# Patient Record
Sex: Female | Born: 1985 | Race: Black or African American | Hispanic: No | Marital: Single
Health system: Midwestern US, Community
[De-identification: ages and names within clinical notes are randomized; demographics above are authoritative.]

## PROBLEM LIST (undated history)

## (undated) ENCOUNTER — Inpatient Hospital Stay: Payer: Self-pay

## (undated) ENCOUNTER — Inpatient Hospital Stay (HOSPITAL_COMMUNITY): Payer: Self-pay

## (undated) DIAGNOSIS — F419 Anxiety disorder, unspecified: Secondary | ICD-10-CM

## (undated) DIAGNOSIS — K50119 Crohn's disease of large intestine with unspecified complications: Secondary | ICD-10-CM

## (undated) DIAGNOSIS — Z01812 Encounter for preprocedural laboratory examination: Secondary | ICD-10-CM

## (undated) DIAGNOSIS — K50913 Crohn's disease, unspecified, with fistula: Secondary | ICD-10-CM

## (undated) DIAGNOSIS — K50012 Crohn's disease of small intestine with intestinal obstruction: Secondary | ICD-10-CM

## (undated) DIAGNOSIS — R933 Abnormal findings on diagnostic imaging of other parts of digestive tract: Secondary | ICD-10-CM

## (undated) DIAGNOSIS — F329 Major depressive disorder, single episode, unspecified: Secondary | ICD-10-CM

## (undated) DIAGNOSIS — R87619 Unspecified abnormal cytological findings in specimens from cervix uteri: Secondary | ICD-10-CM

## (undated) DIAGNOSIS — J45909 Unspecified asthma, uncomplicated: Secondary | ICD-10-CM

## (undated) DIAGNOSIS — F32A Depression, unspecified: Secondary | ICD-10-CM

## (undated) DIAGNOSIS — K5090 Crohn's disease, unspecified, without complications: Principal | ICD-10-CM

## (undated) DIAGNOSIS — Z111 Encounter for screening for respiratory tuberculosis: Secondary | ICD-10-CM

## (undated) DIAGNOSIS — R079 Chest pain, unspecified: Secondary | ICD-10-CM

## (undated) DIAGNOSIS — F331 Major depressive disorder, recurrent, moderate: Principal | ICD-10-CM

## (undated) HISTORY — DX: Unspecified abnormal cytological findings in specimens from cervix uteri: R87.619

## (undated) HISTORY — DX: Unspecified asthma, uncomplicated: J45.909

## (undated) HISTORY — DX: Depression, unspecified: F32.A

## (undated) HISTORY — DX: Major depressive disorder, single episode, unspecified: F32.9

## (undated) MED ORDER — SERTRALINE 50 MG TAB
50 mg | ORAL_TABLET | Freq: Every day | ORAL | Status: DC
Start: ? — End: 2014-10-29

---

## 2007-11-23 HISTORY — PX: BACK SURGERY: SHX140

## 2008-03-13 LAB — METABOLIC PANEL, COMPREHENSIVE
A-G Ratio: 1.3 (ref 1.1–2.2)
ALT (SGPT): 27 U/L — ABNORMAL LOW (ref 30–65)
AST (SGOT): 14 U/L — ABNORMAL LOW (ref 15–37)
Albumin: 4 g/dL (ref 3.5–5.0)
Alk. phosphatase: 81 U/L (ref 50–136)
Anion gap: 6 mmol/L (ref 5–15)
BUN/Creatinine ratio: 11 — ABNORMAL LOW (ref 12–20)
BUN: 8 MG/DL (ref 6–20)
Bilirubin, total: 0.3 MG/DL (ref <1.0)
CO2: 29 MMOL/L (ref 21–32)
Calcium: 9 MG/DL (ref 8.5–10.1)
Chloride: 105 MMOL/L (ref 97–108)
Creatinine: 0.7 MG/DL (ref 0.6–1.3)
GFR est AA: >60 mL/min/{1.73_m2} (ref >60)
GFR est non-AA: >60 mL/min/{1.73_m2} (ref >60)
Globulin: 3.2 g/dL (ref 2.0–4.0)
Glucose: 68 MG/DL (ref 50–100)
Potassium: 3.7 MMOL/L (ref 3.5–5.1)
Protein, total: 7.2 g/dL (ref 6.4–8.4)
Sodium: 140 MMOL/L (ref 136–145)

## 2008-03-14 LAB — RPR
RPR: NONREACTIVE
RPR: NONREACTIVE

## 2008-03-14 LAB — SED RATE (ESR): Sed rate (ESR): 5 MM/HR (ref 0–20)

## 2008-03-15 LAB — HIV-1 RNA QL
HIV 1 Ab: NEGATIVE
HIV1 ANTIBODY,HIVR: NEGATIVE

## 2008-03-15 LAB — HEPATITIS PANEL, ACUTE
HEP B CORE Ab, IgM: NEGATIVE
HEPATITIS A Ab, IgM: NEGATIVE
Hep B surface Ag: NEGATIVE
Hepatitis C Ab: NEGATIVE

## 2008-04-12 LAB — METABOLIC PANEL, COMPREHENSIVE
A-G Ratio: 1.1 (ref 1.1–2.2)
ALT (SGPT): 25 U/L — ABNORMAL LOW (ref 30–65)
AST (SGOT): 14 U/L — ABNORMAL LOW (ref 15–37)
Albumin: 4.1 g/dL (ref 3.5–5.0)
Alk. phosphatase: 74 U/L (ref 50–136)
Anion gap: 5 mmol/L (ref 5–15)
BUN/Creatinine ratio: 15 (ref 12–20)
BUN: 12 MG/DL (ref 6–20)
Bilirubin, total: 0.3 MG/DL (ref <1.0)
CO2: 29 MMOL/L (ref 21–32)
Calcium: 8.7 MG/DL (ref 8.5–10.1)
Chloride: 104 MMOL/L (ref 97–108)
Creatinine: 0.8 MG/DL (ref 0.6–1.3)
GFR est AA: >60 mL/min/{1.73_m2} (ref >60)
GFR est non-AA: >60 mL/min/{1.73_m2} (ref >60)
Globulin: 3.6 g/dL (ref 2.0–4.0)
Glucose: 87 MG/DL (ref 50–100)
Potassium: 3.9 MMOL/L (ref 3.5–5.1)
Protein, total: 7.7 g/dL (ref 6.4–8.2)
Sodium: 138 MMOL/L (ref 136–145)

## 2008-04-12 LAB — CBC WITH AUTOMATED DIFF
ABS. BASOPHILS: 0 10*3/uL (ref 0.0–0.1)
ABS. EOSINOPHILS: 0.3 10*3/uL (ref 0.0–0.4)
ABS. LYMPHOCYTES: 1.5 10*3/uL (ref 0.8–3.5)
ABS. MONOCYTES: 0.8 10*3/uL (ref 0–1.0)
ABS. NEUTROPHILS: 6.9 10*3/uL (ref 1.8–8.0)
BASOPHILS: 0 % (ref 0–1)
EOSINOPHILS: 4 % (ref 0–7)
HCT: 33.2 % — ABNORMAL LOW (ref 35.0–47.0)
HGB: 11.8 g/dL (ref 11.5–16.0)
LYMPHOCYTES: 15 % (ref 12–49)
MCH: 31.3 PG (ref 26.0–34.0)
MCHC: 35.5 g/dL — ABNORMAL HIGH (ref 30.0–35.0)
MCV: 88.1 FL (ref 80.0–99.0)
MONOCYTES: 8 % (ref 5–13)
NEUTROPHILS: 73 % (ref 32–75)
PLATELET: 242 10*3/uL (ref 150–400)
RBC: 3.77 M/uL — ABNORMAL LOW (ref 3.80–5.20)
RDW: 13.4 % (ref 11.5–14.5)
WBC: 9.5 10*3/uL (ref 3.6–11.0)

## 2008-04-12 LAB — URINALYSIS W/ REFLEX CULTURE
Bacteria: NEGATIVE /HPF
Bilirubin: NEGATIVE
Glucose: NEGATIVE MG/DL
Ketone: NEGATIVE MG/DL
Nitrites: NEGATIVE
Protein: NEGATIVE MG/DL
Specific gravity: 1.021 (ref 1.003–1.030)
Urobilinogen: 1 EU/DL (ref 0.2–1.0)
pH (UA): 7 (ref 5.0–8.0)

## 2008-04-12 LAB — HCG URINE, QL: HCG urine, QL: NEGATIVE

## 2008-07-19 MED ORDER — ALBUTEROL SULFATE HFA 90 MCG/ACTUATION AEROSOL INHALER
90 mcg/actuation | RESPIRATORY_TRACT | Status: AC | PRN
Start: 2008-07-19 — End: 2009-07-14

## 2008-07-19 NOTE — Progress Notes (Signed)
Lymph nodes swollen for the past month.

## 2008-07-19 NOTE — Patient Instructions (Signed)
English   Spanish   Allergies in Adults: After Your Visit  Your Care Instructions  Allergies occur when your body???s defense system (immune system) overreacts to certain substances. The immune system treats a harmless substance as if it is a harmful germ or virus. Many things can cause this overreaction, including pollens, medicine, food, dust, animal dander, and mold.  Allergies can be mild or severe. Mild allergies can be managed with home treatment. But medicine may be needed to prevent problems.  Managing your allergies is an important part of staying healthy. Your doctor may suggest that you have allergy testing to help find out what is causing your allergies. When you know what things trigger your symptoms, you can avoid them. This can prevent allergy symptoms, asthma, and other health problems.  For severe allergies that cause reactions that affect your whole body (anaphylactic reactions), your doctor may prescribe a shot of epinephrine (such as EpiPen) to carry with you in case you have a severe reaction. Learn how to give yourself the shot and keep it with you at all times. Make sure it is not expired.  Follow-up care is a key part of your treatment and safety. Be sure to make and go to all appointments, and call your doctor if you are having problems. It???s also a good idea to know your test results and keep a list of the medicines you take.  How can you care for yourself at home?  ?? If you have been told by your doctor that dust or dust mites are causing your allergy, decrease the dust around your bed:   Wash sheets, pillowcases, and other bedding in hot water every week.   Use dust-proof covers for pillows, duvets, and mattresses. Avoid plastic covers because they tear easily and do not ???breathe.??? Wash as instructed on the label.   Do not use any blankets and pillows that you do not need.   Use blankets that you can wash in your washing machine.    Consider removing drapes and carpets, which attract and hold dust, from your bedroom.  If you are allergic to house dust and mites, do not use home humidifiers. Your doctor can suggest ways you can control dust and mites.   Look for signs of cockroaches. Cockroaches cause allergic reactions. Use cockroach baits to get rid of them. Then, clean your home well. Cockroaches like areas where grocery bags, newspapers, empty bottles, or cardboard boxes are stored. Do not keep these inside your home, and keep trash and food containers sealed. Seal off any spots where cockroaches might enter your home.   If you are allergic to mold, get rid of furniture, rugs, and drapes that smell musty. Check for mold in the bathroom.   If you are allergic to outdoor pollen or mold spores, use air-conditioning. Change or clean all filters every month. Keep windows closed.   If you are allergic to pollen, stay inside when pollen counts are high. Use a vacuum cleaner with a HEPA filter or a double-thickness filter at least two times each week.   Stay inside when air pollution is bad. Avoid paint fumes, perfumes, and other strong odors.   Avoid conditions that make your allergies worse. Stay away from smoke. Do not smoke or let anyone else smoke in your house. Do not use fireplaces or wood-burning stoves.   If you are allergic to your pets, change the air filter in your furnace every month. Use high-efficiency filters.   If you are  allergic to pet dander, keep pets outside or out of your bedroom. Old carpet and cloth furniture can hold a lot of animal dander. You may need to replace them.  When should you call for help?  Call 911 anytime you think you may need emergency care. For example, call if:  ?? You have severe trouble breathing.   You pass out (lose consciousness).  Note: After calling 911, use the allergy kit prescribed by your doctor if you have one.  Watch closely for changes in your health, and be sure to contact your doctor if:   ?? You need help controlling your allergies.   You have questions about allergy testing.   You do not get better as expected.   Where can you learn more?   Go to [facility]  Enter W171 in the search box to learn more about "Allergies in Adults: After Your Visit".    ?? 2006 - 2009 Healthwise, Incorporated. Care instructions adapted under license by [employer] (which disclaims liability or warranty for this information) . This care instruction is for use with your licensed healthcare professional. If you have questions about a medical condition or this instruction, always ask your healthcare professional. Healthwise disclaims any warranty or liability for your use of this information.  English   Spanish   Asthma in Adults: After Your Visit  Your Care Instructions  During an asthma attack, your airways swell and narrow as a reaction to certain things (triggers). This makes it hard to breathe.  You may be able to prevent asthma attacks if you avoid the things that set off your asthma symptoms. Keeping your asthma under control and treating symptoms before they get bad can help you avoid severe attacks.  If you can control your asthma, you may be able to do all of your normal daily activities. You may also avoid asthma attacks and trips to the hospital.  Follow-up care is a key part of your treatment and safety. Be sure to make and go to all appointments, and call your doctor if you are having problems. It???s also a good idea to know your test results and keep a list of the medicines you take.  How can you care for yourself at home?  ?? Follow your asthma action plan so you can manage your symptoms at home. An asthma action plan will help you prevent and control airway reactions and will tell you what to do during an asthma attack. If you do not have an asthma action plan, work with your doctor to build one.    Take your asthma medicine exactly as prescribed. Medicine plays an important role in controlling asthma. Talk to your doctor right away if you have any questions about what to take and how to take it.   Use your quick-relief medicine when you have symptoms of an attack. Quick-relief medicine often is an albuterol inhaler. Some people need to use quick-relief medicine before they exercise.   Take your controller medicine every day, not just when you have symptoms. Controller medicine is usually an inhaled corticosteroid. The goal is to prevent problems before they occur. Do not use your controller medicine to try to treat an attack that has already started. It does not work fast enough to help.   Keep your medicines with you at all times.  Talk to your doctor before using other medicines. Some medicines, such as aspirin, can cause asthma attacks in some people.   Learn how to use a peak  flow meter to check how well you are breathing. This can help you predict when an asthma attack is going to occur. Then you can take medicine to prevent the asthma attack or make it less severe.   See your doctor regularly. These visits will help you learn more about asthma and what you can do to control it. Your doctor will monitor your treatment to make sure the medicine is helping you.   Keep track of your asthma attacks and your treatment. After you have had an attack, write down what triggered it, what helped end it, and any concerns you have about your asthma action plan. Take your diary when you see your doctor. You can then review your asthma action plan and decide if it is working.   Take your peak flow meter with you when you visit your doctor. Together, you can review the way you use it.   Do not smoke or allow others to smoke around you. Avoid smoky places. Smoking makes asthma worse. If you need help quitting, talk to your doctor about stop-smoking programs and medicines. These can increase your chances of quitting for good.   Learn what triggers an asthma attack for you, and avoid the triggers when you can. Common triggers include colds, smoke, air pollution, dust, pollen, mold, pets, cockroaches, stress, and cold air.   Avoid colds and the flu. Get a pneumococcal vaccine shot. If you have had one before, ask your doctor whether you need a second dose. Get a flu shot every fall. If you must be around people with colds or the flu, wash your hands often.  When should you call for help?  Call 911 anytime you think you may need emergency care. For example, call if:  ?? You have severe trouble breathing.  Call your doctor now or seek immediate medical care if:  ?? Your symptoms do not get better after you have followed your asthma action plan.   You cough up yellow, dark brown, or bloody mucus (sputum).  Watch closely for changes in your health, and be sure to contact your doctor if:  ?? Your coughing and wheezing get worse.   You need to use quick-relief medicine on more than 2 days a week (unless it is just for exercise).   You need help figuring out what is triggering your asthma attacks.   Where can you learn more?   Go to [email or web address]  Enter P597 in the search box to learn more about "Asthma in Adults: After Your Visit".    ?? 2006 - 2009 Healthwise, Inc[employer]. Care instructions adapted under license by [employer] (which disclaims liability or warranty for this information) . This care instruction is for use with your licensed healthcare professional. If you have questions about a medical condition or this instruction, always ask your healthcare professional. Healthwise disclaims any warranty or liability for your use of this information.

## 2008-07-19 NOTE — Progress Notes (Signed)
HISTORY OF PRESENT ILLNESS  Denise Huber is a 22 y.o. female.  HPI  Sore throat for a month, no cough, but SOB and difficulty on inspiration for 2 days, concerned it is viral. Doing well with regards to stomach issues, Nexium helped and stress level is lowering so sx are not as severe,   Asthma under control, uses inhaler sparingly, recently getting over a cold that her daughter had, thinks it may be related to that.    Patient Active Problem List   Diagnoses Code   ??? Allergic Rhinitis 477.9AD   ??? Asthma 493.90AE   ??? Lymphadenopathy 785.6AS       Review of Systems   Constitutional: Negative for fever, chills, weight loss and malaise/fatigue.   HENT: Negative for hearing loss, ear pain and tinnitus.    Eyes: Negative for blurred vision and double vision.   Respiratory: Positive for cough and shortness of breath. Negative for sputum production and wheezing.    Cardiovascular: Negative for chest pain, palpitations and orthopnea.   Gastrointestinal: Negative.    Genitourinary: Negative.    Musculoskeletal: Negative.    Skin: Negative for rash and itching.   Neurological: Negative.  Negative for headaches.   Endo/Heme/Allergies: Negative.    Psychiatric/Behavioral: Negative.      Physical Exam   Constitutional: She is oriented. She appears well-developed and well-nourished.   HENT:   Head: Normocephalic and atraumatic.   Mouth/Throat: No oropharyngeal exudate.   Eyes: Conjunctivae and extraocular motions are normal. Pupils are equal, round, and reactive to light. Right eye exhibits no discharge.   Neck: Normal range of motion. Neck supple.   Cardiovascular: Normal rate, regular rhythm and normal heart sounds.  Exam reveals no gallop.    No murmur heard.  Pulmonary/Chest: Effort normal and breath sounds normal. No respiratory distress. She has no wheezes. She has no rales. She exhibits no tenderness.   Abdominal: Soft. Bowel sounds are normal.   Musculoskeletal: Normal range of motion.    Neurological: She is alert and oriented.   Skin: Skin is warm.   Psychiatric: She has a normal mood and affect. Her behavior is normal.   3 total lymph nodes palpable submental and cervical, all less than .5 cm and all motile, none are firm  ASSESSMENT and PLAN  Encounter Diagnoses   Code Name Primary? Qualifier   ??? 477.9AD Allergic Rhinitis, likely cause of symptoms, will treat supportively with Claritin, return for Flonase if sx worsen, recc smoking cessation Yes     Plan: ALBUTEROL SULFATE HFA 90 MCG/ACTUATION AEROSOL INHALER   ??? 493.90AE Asthma, dong well, mild intermittent, no other medication needed at this time      Plan: ALBUTEROL SULFATE HFA 90 MCG/ACTUATION AEROSOL INHALER   ??? 785.6AS Lymphadenopathy-rshotty, return if greater than 1 cm

## 2008-08-13 LAB — INFLUENZA A & B AG (RAPID TEST)
Influenza A Antigen: NEGATIVE
Influenza B Antigen: NEGATIVE

## 2008-08-13 LAB — STREP AG SCREEN, GROUP A: Group A Strep Ag ID: NEGATIVE

## 2008-08-19 MED ORDER — PREDNISONE 20 MG TAB
20 mg | ORAL_TABLET | Freq: Every day | ORAL | Status: AC
Start: 2008-08-19 — End: 2008-08-22

## 2008-08-19 MED ORDER — AZITHROMYCIN 250 MG TAB
250 mg | ORAL_TABLET | Freq: Every day | ORAL | Status: AC
Start: 2008-08-19 — End: 2008-08-24

## 2008-08-19 MED ORDER — ESOMEPRAZOLE MAGNESIUM 40 MG CAP, DELAYED RELEASE
40 mg | ORAL_CAPSULE | Freq: Every day | ORAL | Status: DC
Start: 2008-08-19 — End: 2009-04-08

## 2008-08-19 NOTE — Progress Notes (Signed)
Fu from [facility]  08/12/08 trouble breathing  Peak flow   [phone]

## 2008-08-19 NOTE — Progress Notes (Signed)
HISTORY OF PRESENT ILLNESS  Denise Huber is a 22 y.o. female.  HPI  H/o asthma as child, still carries inhaler, had attack last week, on Monday evening went to [facility] given nebulizer controlled sx, given no abx, no steroids, feels lot better still cough, thick productive, green sputum, breathing better, no facial tenderness  Patient Active Problem List   Diagnoses Code   ??? Allergic Rhinitis 477.9AD   ??? Asthma 493.90AE   ??? Lymphadenopathy 785.6AS       Had ovarian cyst rupture had CT scan April 2009, given pain meds and sx resolved  ROS  Review of Systems   Constitutional: Negative.    HENT: Negative.    Eyes: Negative.    Respiratory: Negative.    Cardiovascular: Negative.    Gastrointestinal: Negative.    Genitourinary: Negative.    Musculoskeletal: Negative.    Skin: Negative.    Neurological: Negative.    Endo/Heme/Allergies: Negative.    Psychiatric/Behavioral: Negative.    Physical Exam  Constitutional: She is oriented. She appears well-developed and well-nourished. She appears not diaphoretic. No distress.   HENT:   Head: Normocephalic and atraumatic.   Eyes: Conjunctivae are normal. Pupils are equal, round, and reactive to light. Right eye exhibits no discharge. Left eye exhibits no discharge. No scleral icterus.   Neck: No tracheal deviation present. No thyromegaly present.   Cardiovascular: Normal rate and regular rhythm.  Exam reveals no gallop and no friction rub.    No murmur heard.  Pulmonary/Chest: Effort normal and breath sounds normal, but increased I;E ratio 1:3. No respiratory distress. She has no wheezes.   Abdominal: Soft. Bowel sounds are normal. She exhibits no distension. No tenderness.   Musculoskeletal: Normal range of motion. She exhibits no edema and no tenderness.   Lymphadenopathy:     She has no cervical adenopathy.   Neurological: She is alert and oriented. She has normal reflexes.   Skin: Skin is warm. No rash noted. She is not diaphoretic. No erythema.    Psychiatric: She has a normal mood and affect. Her behavior is normal.   ASSESSMENT and PLAN  Encounter Diagnoses   Code Name Primary? Qualifier   ??? 493.90AE Asthma Yes     Plan: PREDNISONE 20 MG TAB, AZITHROMYCIN 250 MG TAB   ??? 620.2AW Ruptured Ovarian Cyst      Plan: US PELVIC LIMITED   ??? 535.50H Gastritis      Plan: ESOMEPRAZOLE MAGNESIUM 40 MG CAP, DELAYED RELEASE   Denise Huber appears to be recuperating from her wheezing and asthma attacks that she had to go to [facility] for.  She was not admitted nor did she go to the ICU.  She did receive nebulizer treatments and it was unclear as to how many in the emergency room.  She will continue with the albuterol.  I will prescribe for her a three day course of prednisone 40 mg daily and a Z-Pak to clear out any residual congestion.  She was recently noted to have an ovarian cyst due to lower left quadrant pain.  Will repeat the ultrasound to discuss resolution of the cyst.  For her gastritis, she will continue with the Nexium 40 mg.  I will see her back in January to discuss the possibility of initiating oral contraceptives for better medical management of her cysts.  At this point in time, she is a smoker and she chooses not to be on the birth control pill.

## 2008-08-19 NOTE — Patient Instructions (Signed)
English   Spanish   Asthma in Adults: After Your Visit  Your Care Instructions  During an asthma attack, your airways swell and narrow as a reaction to certain things (triggers). This makes it hard to breathe.  You may be able to prevent asthma attacks if you avoid the things that set off your asthma symptoms. Keeping your asthma under control and treating symptoms before they get bad can help you avoid severe attacks.  If you can control your asthma, you may be able to do all of your normal daily activities. You may also avoid asthma attacks and trips to the hospital.  Follow-up care is a key part of your treatment and safety. Be sure to make and go to all appointments, and call your doctor if you are having problems. It???s also a good idea to know your test results and keep a list of the medicines you take.  How can you care for yourself at home?  ?? Follow your asthma action plan so you can manage your symptoms at home. An asthma action plan will help you prevent and control airway reactions and will tell you what to do during an asthma attack. If you do not have an asthma action plan, work with your doctor to build one.   Take your asthma medicine exactly as prescribed. Medicine plays an important role in controlling asthma. Talk to your doctor right away if you have any questions about what to take and how to take it.   Use your quick-relief medicine when you have symptoms of an attack. Quick-relief medicine often is an albuterol inhaler. Some people need to use quick-relief medicine before they exercise.   Take your controller medicine every day, not just when you have symptoms. Controller medicine is usually an inhaled corticosteroid. The goal is to prevent problems before they occur. Do not use your controller medicine to try to treat an attack that has already started. It does not work fast enough to help.   Keep your medicines with you at all times.   Talk to your doctor before using other medicines. Some medicines, such as aspirin, can cause asthma attacks in some people.   Learn how to use a peak flow meter to check how well you are breathing. This can help you predict when an asthma attack is going to occur. Then you can take medicine to prevent the asthma attack or make it less severe.   See your doctor regularly. These visits will help you learn more about asthma and what you can do to control it. Your doctor will monitor your treatment to make sure the medicine is helping you.   Keep track of your asthma attacks and your treatment. After you have had an attack, write down what triggered it, what helped end it, and any concerns you have about your asthma action plan. Take your diary when you see your doctor. You can then review your asthma action plan and decide if it is working.   Take your peak flow meter with you when you visit your doctor. Together, you can review the way you use it.  Do not smoke or allow others to smoke around you. Avoid smoky places. Smoking makes asthma worse. If you need help quitting, talk to your doctor about stop-smoking programs and medicines. These can increase your chances of quitting for good.   Learn what triggers an asthma attack for you, and avoid the triggers when you can. Common triggers include colds, smoke, air pollution,   dust, pollen, mold, pets, cockroaches, stress, and cold air.   Avoid colds and the flu. Get a pneumococcal vaccine shot. If you have had one before, ask your doctor whether you need a second dose. Get a flu shot every fall. If you must be around people with colds or the flu, wash your hands often.  When should you call for help?  Call 911 anytime you think you may need emergency care. For example, call if:  ?? You have severe trouble breathing.  Call your doctor now or seek immediate medical care if:  ?? Your symptoms do not get better after you have followed your asthma action plan.    You cough up yellow, dark brown, or bloody mucus (sputum).  Watch closely for changes in your health, and be sure to contact your doctor if:  ?? Your coughing and wheezing get worse.   You need to use quick-relief medicine on more than 2 days a week (unless it is just for exercise).   You need help figuring out what is triggering your asthma attacks.   Where can you learn more?   Go to http://www.healthwise.net/[facility]  Enter P597 in the search box to learn more about "Asthma in Adults: After Your Visit".    ?? 2006 - 2009 Healthwise, Incorporated. Care instructions adapted under license by [employer] (which disclaims liability or warranty for this information) . This care instruction is for use with your licensed healthcare professional. If you have questions about a medical condition or this instruction, always ask your healthcare professional. Healthwise disclaims any warranty or liability for your use of this information.

## 2008-08-22 NOTE — Telephone Encounter (Signed)
[facility] 08/22/08 4:04p.m. cvenable  Dr. Sherril Croon  DOB:  [birth year]  Patient checked with [facility] in [place], did not receive prescription for back pain, and its the same medicine she prescribed for her stomach. Best number to reach her:  [phone]

## 2008-08-23 NOTE — Telephone Encounter (Signed)
Message left on VM --[facility] faxed refill request for Hydrocodone-APAP 5-500 med was ordered by Dr. Delena Bali.  Told patient to call that Dr. For a refill.

## 2008-08-30 NOTE — Telephone Encounter (Signed)
Call patient tell her that ovarian cyst seems to be resolving

## 2008-08-30 NOTE — Telephone Encounter (Signed)
Spoke with patient results given.

## 2008-08-31 LAB — HCG URINE, QL: HCG urine, QL: NEGATIVE

## 2008-10-07 NOTE — Telephone Encounter (Signed)
10/02/08 [facility] can do here or refer out. bm
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
## 2008-10-25 NOTE — Telephone Encounter (Signed)
Pt has appt set up mon 12/7 with her Ortho . She will discuss with them and follow up here depending on what the findings are

## 2008-11-11 NOTE — Progress Notes (Signed)
Fu  From   aa    lortabs making  Her nauseated
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
## 2008-11-27 MED ORDER — AMOXICILLIN-CLAVULANATE 500 MG-125 MG TAB
500-125 mg | ORAL_TABLET | Freq: Two times a day (BID) | ORAL | Status: AC
Start: 2008-11-27 — End: 2008-12-07
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
## 2008-11-27 NOTE — Patient Instructions (Signed)
Questions to ask Neurosurgeon:    1. What is the healing process like?  How much pain should I expect? What if the pain is more than what the pills can help me with?    2. How long will I wear this brace? It's very uncomfortable, can I get it adjusted, it feels like it doesn't fit me well    3. I am having pain in areas that did not have surgery? Why? Is this normal? What should I do about this?    4.The lortab makes me nauseated, what other options do I have    5. The valium helped, do I need more?    6. I had called the office Nov 2009 to get more pain pills or adjust my medicine, but I didn't hear back, is there another way to get a hold of your office to get my questions answered?    7. Swelling in lower back, is this normal

## 2008-11-27 NOTE — Progress Notes (Signed)
Fu  From aa    lortab not working all the time    Getting pains in left side on and off

## 2008-12-24 NOTE — Telephone Encounter (Signed)
Spoke with patient and she wants to talk to Dr. Sherril Croon.  Told she is out of the office and I will send her the message and she can talk to you on 12/25/08.  Patient agrees and understands.

## 2008-12-24 NOTE — Telephone Encounter (Signed)
Saw Joline Maxcy a counselor. Would like to talk to you about it. Phone [phone]

## 2008-12-25 MED ORDER — DULOXETINE 30 MG CAP, DELAYED RELEASE
30 mg | ORAL_CAPSULE | Freq: Every day | ORAL | Status: DC
Start: 2008-12-25 — End: 2009-01-15

## 2008-12-25 MED ORDER — DULOXETINE 30 MG CAP, DELAYED RELEASE
30 mg | ORAL_CAPSULE | Freq: Every day | ORAL | Status: DC
Start: 2008-12-25 — End: 2008-12-25

## 2008-12-25 NOTE — Telephone Encounter (Signed)
Spoke with patient, counselor suggested antidepressants to help with sx, pt amenable to trying them. Discussed different types of antidepressants, considering celexa or cymbalta (cymbalta may benefit pain experienced from back surgery)  Samples located for cymbalta, will call in script if pt finds it beneficial,   Recommend appt in 2 weeks for FU and discussion

## 2008-12-25 NOTE — Telephone Encounter (Addendum)
Pt came by and picked up samples

## 2008-12-25 NOTE — Telephone Encounter (Signed)
Pt picked up samples

## 2008-12-25 NOTE — Telephone Encounter (Signed)
Talked with pt will pick up samples of cymbalta  #[phone]

## 2009-01-15 MED ORDER — DULOXETINE 30 MG CAP, DELAYED RELEASE
30 mg | ORAL_CAPSULE | Freq: Every day | ORAL | Status: DC
Start: 2009-01-15 — End: 2009-04-08

## 2009-04-09 LAB — URINALYSIS W/ REFLEX CULTURE
Bacteria: NEGATIVE /HPF
Bilirubin: NEGATIVE
Blood: NEGATIVE
Glucose: NEGATIVE MG/DL
Ketone: NEGATIVE MG/DL
Nitrites: NEGATIVE
Protein: NEGATIVE MG/DL
Specific gravity: 1.024 (ref 1.003–1.030)
Urobilinogen: 1 EU/DL (ref 0.2–1.0)
pH (UA): 7 (ref 5.0–8.0)

## 2009-04-09 LAB — HCG URINE, QL: HCG urine, QL: NEGATIVE

## 2009-04-09 MED ORDER — IBUPROFEN 600 MG TAB
600 mg | ORAL_TABLET | Freq: Three times a day (TID) | ORAL | Status: AC | PRN
Start: 2009-04-09 — End: 2009-04-19

## 2009-04-09 MED ORDER — HYDROCODONE-ACETAMINOPHEN 5 MG-500 MG TAB
5-500 mg | ORAL_TABLET | Freq: Four times a day (QID) | ORAL | Status: AC | PRN
Start: 2009-04-09 — End: 2009-04-14

## 2009-04-09 MED ADMIN — predniSONE (DELTASONE) tablet 60 mg: ORAL | @ 05:00:00 | NDC 00054001829

## 2009-04-09 MED ADMIN — ibuprofen (MOTRIN) tablet 600 mg: ORAL | @ 05:00:00 | NDC 00904518640

## 2009-04-09 MED ADMIN — hydrocodone-acetaminophen (LORTAB) 5-500 mg per tablet 1 Tab: ORAL | @ 05:00:00 | NDC 00406035762

## 2009-04-09 MED FILL — PREDNISONE 20 MG TAB: 20 mg | ORAL | Qty: 3

## 2009-04-09 MED FILL — IBUPROFEN 600 MG TAB: 600 mg | ORAL | Qty: 1

## 2009-04-09 MED FILL — HYDROCODONE-ACETAMINOPHEN 5 MG-500 MG TAB: 5-500 mg | ORAL | Qty: 1
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
## 2009-04-09 NOTE — ED Notes (Shared)
12:33 AM  Consulted pt/family about sx, dx, tx and rx with good understanding. Care plan outlined and precautions discussed. There are no new complaints, changes, or physical finding. All pt/family's questions and concerns were addressed. Results of radiographic studies and labs were reviewed with the pt. Medications were reviewed with the patient. Pt is ready to go home. Pt will return to the ER with any further deterioration and will f/u with PCP.

## 2009-04-09 NOTE — ED Notes (Signed)
I was personally available for consultation in the emergency department.  I have reviewed the chart and agree with the documentation recorded by the MLP, including the assessment, treatment plan, and disposition.  Brendy Ficek N. O'Bier, MD

## 2009-06-18 NOTE — ED Notes (Signed)
R lower back pain x several days, occasionally radiates down into R hip and down R leg, hip/knee "gives out" sometimes.

## 2009-06-19 LAB — URINALYSIS W/ REFLEX CULTURE
Bilirubin: NEGATIVE
Blood: NEGATIVE
Glucose: NEGATIVE MG/DL
Ketone: NEGATIVE MG/DL
Nitrites: NEGATIVE
Protein: NEGATIVE MG/DL
Specific gravity: 1.022 (ref 1.003–1.030)
Urobilinogen: 1 EU/DL (ref 0.2–1.0)
pH (UA): 6.5 (ref 5.0–8.0)

## 2009-06-19 LAB — HCG URINE, QL: HCG urine, QL: NEGATIVE

## 2009-06-19 MED ORDER — CYCLOBENZAPRINE 10 MG TAB
10 mg | ORAL_TABLET | Freq: Three times a day (TID) | ORAL | Status: DC | PRN
Start: 2009-06-19 — End: 2015-11-20

## 2009-06-19 MED ORDER — HYDROCODONE-ACETAMINOPHEN 5 MG-500 MG TAB
5-500 mg | ORAL_TABLET | Freq: Four times a day (QID) | ORAL | Status: DC | PRN
Start: 2009-06-19 — End: 2010-01-24

## 2009-06-19 MED ADMIN — oxycodone-acetaminophen (PERCOCET) 5-325 mg per tablet 2 Tab: ORAL | @ 06:00:00 | NDC 00406051262

## 2009-06-19 MED FILL — OXYCODONE-ACETAMINOPHEN 5 MG-325 MG TAB: 5-325 mg | ORAL | Qty: 2

## 2009-06-19 MED FILL — HYDROCODONE-ACETAMINOPHEN 7.5 MG-500 MG TAB: ORAL | Qty: 1

## 2009-06-19 NOTE — ED Notes (Signed)
Patient states that she has low back pain on the left side, tender to touch.  She states that this started 4-5 days ago. She also stated that he legs and hip gives out on her on that side as well.
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
## 2009-06-19 NOTE — ED Notes (Signed)
Pt. Ambulatory to the bathroom.

## 2009-06-19 NOTE — ED Notes (Signed)
I personally saw and examined the patient.  I have reviewed and agree with the MLP's findings, including all diagnostic interpretations, and plans as written.   I was present during the key portions of separately billed procedures.    Oral Hallgren L. Victoria Euceda, MD

## 2009-06-19 NOTE — ED Notes (Signed)
Pt. States that Lortab upsets her stomach.  Dr. Zeb Comfort in talking with pt.
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
## 2009-06-19 NOTE — ED Notes (Signed)
Dr. Gambill in talking with pt.

## 2009-06-20 LAB — CULTURE, URINE
Colonies Counted: 20000
Colony Count: 20000

## 2009-11-22 HISTORY — PX: BACK SURGERY: SHX140

## 2009-11-22 NOTE — L&D Delivery Note (Signed)
SVD OF A VIABLE FEMALE INFANT OVER AN INTACT PERINEUM IN OP. +NUCHAL CORD X 2. NO SHOULDER DYSTOCIA. NO LACERATIONS. PLACENTA DEL SPONT, INTACT WITH 3VC. EBL .

## 2010-01-24 ENCOUNTER — Observation Stay
Admit: 2010-01-24 | Discharge: 2010-01-24 | Disposition: A | Discharge status: Home / Self Care | Payer: PRIVATE HEALTH INSURANCE | Attending: Independent Medical Examiner | Admitting: Independent Medical Examiner

## 2010-01-24 DIAGNOSIS — O9989 Other specified diseases and conditions complicating pregnancy, childbirth and the puerperium: Secondary | ICD-10-CM

## 2010-01-24 LAB — BETA HCG, QT
Beta HCG, QT: 4805 m[IU]/mL — ABNORMAL HIGH (ref 0–6)
hCG Quant: 4805 m[IU]/mL — ABNORMAL HIGH (ref 0–6)

## 2010-01-24 LAB — URINALYSIS W/ REFLEX CULTURE
Bacteria: NEGATIVE /HPF
Bilirubin: NEGATIVE
Blood: NEGATIVE
Glucose: NEGATIVE MG/DL
Ketone: NEGATIVE MG/DL
Leukocyte Esterase: NEGATIVE
Nitrites: NEGATIVE
Protein: NEGATIVE MG/DL
Specific gravity: 1.019 (ref 1.003–1.030)
Urobilinogen: 0.2 EU/DL (ref 0.2–1.0)
pH (UA): 6.5 (ref 5.0–8.0)

## 2010-01-24 LAB — METABOLIC PANEL, COMPREHENSIVE
A-G Ratio: 0.9 — ABNORMAL LOW (ref 1.1–2.2)
ALT (SGPT): 23 U/L (ref 12–78)
AST (SGOT): 20 U/L (ref 15–37)
Albumin: 3.4 g/dL — ABNORMAL LOW (ref 3.5–5.0)
Alk. phosphatase: 76 U/L (ref 50–136)
Anion gap: 10 mmol/L (ref 5–15)
BUN/Creatinine ratio: 10 — ABNORMAL LOW (ref 12–20)
BUN: 7 MG/DL (ref 6–20)
Bilirubin, total: 0.2 MG/DL (ref 0.2–1.0)
CO2: 24 MMOL/L (ref 21–32)
Calcium: 8.2 MG/DL — ABNORMAL LOW (ref 8.5–10.1)
Chloride: 105 MMOL/L (ref 97–108)
Creatinine: 0.7 MG/DL (ref 0.6–1.3)
GFR est AA: >60 mL/min/{1.73_m2} (ref >60)
GFR est non-AA: >60 mL/min/{1.73_m2} (ref >60)
Globulin: 3.7 g/dL (ref 2.0–4.0)
Glucose: 80 MG/DL (ref 65–100)
Potassium: 3.6 MMOL/L (ref 3.5–5.1)
Protein, total: 7.1 g/dL (ref 6.4–8.2)
Sodium: 139 MMOL/L (ref 136–145)

## 2010-01-24 LAB — CBC WITH AUTOMATED DIFF
ABS. BASOPHILS: 0 10*3/uL (ref 0.0–0.1)
ABS. EOSINOPHILS: 0.4 10*3/uL (ref 0.0–0.4)
ABS. LYMPHOCYTES: 2.7 10*3/uL (ref 0.8–3.5)
ABS. MONOCYTES: 0.8 10*3/uL (ref 0.0–1.0)
ABS. NEUTROPHILS: 5.6 10*3/uL (ref 1.8–8.0)
BASOPHILS: 0 % (ref 0–1)
EOSINOPHILS: 5 % (ref 0–7)
HCT: 31.8 % — ABNORMAL LOW (ref 35.0–47.0)
HGB: 11 g/dL — ABNORMAL LOW (ref 11.5–16.0)
LYMPHOCYTES: 29 % (ref 12–49)
MCH: 29.6 PG (ref 26.0–34.0)
MCHC: 34.6 g/dL (ref 30.0–36.5)
MCV: 85.5 FL (ref 80.0–99.0)
MONOCYTES: 8 % (ref 5–13)
NEUTROPHILS: 58 % (ref 32–75)
PLATELET: 284 10*3/uL (ref 150–400)
RBC: 3.72 M/uL — ABNORMAL LOW (ref 3.80–5.20)
RDW: 14.5 % (ref 11.5–14.5)
WBC: 9.5 10*3/uL (ref 3.6–11.0)

## 2010-01-24 LAB — LIPASE: Lipase: 190 U/L (ref 73–393)

## 2010-01-24 LAB — AMYLASE: Amylase: 41 U/L (ref 25–115)

## 2010-01-24 LAB — HEMATOCRIT: HCT: 31 % — ABNORMAL LOW (ref 35.0–47.0)

## 2010-01-24 LAB — HEMOGLOBIN: HGB: 10.6 g/dL — ABNORMAL LOW (ref 11.5–16.0)

## 2010-01-24 MED ADMIN — morphine injection 4 mg: INTRAVENOUS | @ 07:00:00 | NDC 00409125830

## 2010-01-24 MED ADMIN — lactated ringers infusion: INTRAVENOUS | @ 14:00:00 | NDC 00409795309

## 2010-01-24 MED ADMIN — ondansetron (ZOFRAN) injection 8 mg: INTRAVENOUS | @ 07:00:00 | NDC 00143989105

## 2010-01-24 MED FILL — MORPHINE 4 MG/ML SYRINGE: 4 mg/mL | INTRAMUSCULAR | Qty: 1

## 2010-01-24 MED FILL — LACTATED RINGERS IV: INTRAVENOUS | Qty: 1000

## 2010-01-24 MED FILL — ONDANSETRON (PF) 4 MG/2 ML INJECTION: 4 mg/2 mL | INTRAMUSCULAR | Qty: 4

## 2010-01-24 NOTE — ED Notes (Signed)
Pt to US
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
## 2010-01-24 NOTE — ED Notes (Addendum)
Pt stated she started having severe abdominal pain 15 minutes ago, stated she feels nauseous but has not vomited. Pt stated she is pregnant, has no idea how far along she is, has not seen an OB or received any prenatal care and her last menstrual period was in January
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
## 2010-01-24 NOTE — Progress Notes (Signed)
Gynecology Discharge Summary     Name: Denise Huber MRN: [id number]  SSN: [id number]    Date of Birth: 04-27-1986  Age: 24 y.o.  Sex: female      Admit Date: 01/24/2010    Discharge Date: 01/24/2010      Admitting Physician: Daymon Larsen. Ival Bible, MD     Discharge Physician: Lorn Junes, MD     Admission Diagnoses:1.  possible cyst rupture  possible cyst rupture,   2.  Early IUP (intrauterine pregnancy).    Discharge Diagnoses: Patient Active Hospital Problem List:   * No active hospital problems. *        Diagnostics and Treatments: Vaginal ultrasound    Consults: None    Hospital Course:   Observation to ensure no pain without medication    Patient Instructions:   There are no discharge medications for this patient.  She is to pick up Prenatal vitamins.  She is to Make an appointment to see HER Ob/Gyn for this early pregnancy     Activity: No sex, douching, or tampons for 6 weeks or as directed by your physician. No heavy lifting for 6 weeks. No driving while taking pain medication.  Diet: Resume pre-hospital diet.    No orders of the defined types were placed in this encounter.       Signed By:  Lorn Junes, MD     January 24, 2010

## 2010-01-24 NOTE — Progress Notes (Signed)
Called the lab to add on Hemoglobin, was advised to place order in as an add on.  The lab is to add a hemoglobin to the 0600 HCT draw this morning.

## 2010-01-24 NOTE — Progress Notes (Signed)
Reviewed d/c instructions. Encouraged pt to call MD with any questions or concerns. Pt verbalized understanding to all instructions. There were no prescriptions to be given.

## 2010-01-24 NOTE — Progress Notes (Signed)
TRANSFER - OUT REPORT:    Verbal report given to Tara,RN(name) on [name]  being transferred to 356(unit) for routine progression of care       Report consisted of patient???s Situation, Background, Assessment and   Recommendations(SBAR).     Information from the following report(s) SBAR was reviewed with the receiving nurse.    Opportunity for questions and clarification was provided.

## 2010-01-24 NOTE — ED Notes (Signed)
Pelvic exam chaperoned with OB MD, pt tolerated without difficulty.

## 2010-01-24 NOTE — Progress Notes (Signed)
Reported off to Cleotilde Neer, Charity fundraiser

## 2010-01-29 LAB — HIV-1 AB WESTERN BLOT CONFIRM ONLY
HIV, EXTERNAL: NONREACTIVE
HIV, External: NONREACTIVE

## 2010-01-29 LAB — RPR
RPR, EXTERNAL: NONREACTIVE
RPR, External: NONREACTIVE

## 2010-01-29 LAB — BLOOD TYPE, (ABO+RH)
ABO,Rh: B POS
TYPE, ABO & RH, EXTERNAL: B POS

## 2010-01-29 LAB — RUBELLA AB, IGM: Rubella, External: IMMUNE

## 2010-01-29 LAB — GYN RAPID GP B STREP: GrBStrep, External: NEGATIVE

## 2010-01-29 LAB — HEP B SURFACE AG: HBsAg, External: NEGATIVE

## 2010-01-29 LAB — ANTIBODY SCREEN: Antibody screen, External: NEGATIVE

## 2010-09-20 ENCOUNTER — Inpatient Hospital Stay
Admit: 2010-09-20 | Discharge: 2010-09-23 | Disposition: A | Discharge status: Home / Self Care | Payer: PRIVATE HEALTH INSURANCE | Attending: Specialist | Admitting: Specialist

## 2010-09-20 LAB — CBC WITH AUTOMATED DIFF
ABS. BASOPHILS: 0 10*3/uL (ref 0.0–0.1)
ABS. EOSINOPHILS: 0.2 10*3/uL (ref 0.0–0.4)
ABS. LYMPHOCYTES: 1.8 10*3/uL (ref 0.8–3.5)
ABS. MONOCYTES: 0.9 10*3/uL (ref 0.0–1.0)
ABS. NEUTROPHILS: 9.8 10*3/uL — ABNORMAL HIGH (ref 1.8–8.0)
BASOPHILS: 0 % (ref 0–1)
EOSINOPHILS: 1 % (ref 0–7)
HCT: 32.2 % — ABNORMAL LOW (ref 35.0–47.0)
HGB: 11.4 g/dL — ABNORMAL LOW (ref 11.5–16.0)
LYMPHOCYTES: 14 % (ref 12–49)
MCH: 30.7 PG (ref 26.0–34.0)
MCHC: 35.4 g/dL (ref 30.0–36.5)
MCV: 86.8 FL (ref 80.0–99.0)
MONOCYTES: 7 % (ref 5–13)
NEUTROPHILS: 78 % — ABNORMAL HIGH (ref 32–75)
PLATELET: 268 10*3/uL (ref 150–400)
RBC: 3.71 M/uL — ABNORMAL LOW (ref 3.80–5.20)
RDW: 12.9 % (ref 11.5–14.5)
WBC: 12.6 10*3/uL — ABNORMAL HIGH (ref 3.6–11.0)

## 2010-09-20 MED ADMIN — sodium chloride (NS) 0.9 % flush: @ 21:00:00 | NDC 87701099893

## 2010-09-20 MED FILL — SALINE FLUSH INJECTION SYRINGE: INTRAMUSCULAR | Qty: 20

## 2010-09-20 NOTE — Progress Notes (Signed)
BEDSIDE REPORT GIVEN TO L STEVENS, RN

## 2010-09-20 NOTE — Progress Notes (Signed)
24 y/o g2p1 edc 09/[facility] for [facility] cervidil indx.  Pt emptied bladder, changed into gown, consents reviewed and signed.

## 2010-09-20 NOTE — Progress Notes (Signed)
Up to br. No needs at present time. Denies pain.

## 2010-09-20 NOTE — Progress Notes (Signed)
Pt resting in bed. FOB at bedside with pt supportive. Assessment complete and WNL. VSS. Pt voices no needs. Waiting for Dr Rosine Door to see pt and place cervidil. No pain. Only occasional mild ctx noted, reactive tracing. POC discussed with pt and pt verbalizes understanding with no questions.

## 2010-09-20 NOTE — Progress Notes (Signed)
Dr Rosine Door in to see pt. Cervidil placed.

## 2010-09-20 NOTE — Progress Notes (Signed)
On efm x2, fhts 135, +fm, palate soft

## 2010-09-21 MED ADMIN — oxytocin (PITOCIN) 30 units/500 mL D5W: INTRAVENOUS | @ 15:00:00 | NDC 24200020313

## 2010-09-21 MED ADMIN — lactated ringers infusion: INTRAVENOUS | @ 16:00:00 | NDC 00409795309

## 2010-09-21 MED ADMIN — oxytocin (PITOCIN) infusion: @ 05:00:00 | NDC 24200021216

## 2010-09-21 MED ADMIN — lactated ringers bolus infusion 500 mL: INTRAVENOUS | @ 12:00:00 | NDC 00409795309

## 2010-09-21 MED ADMIN — oxytocin (PITOCIN) 30 units/500 mL D5W: INTRAVENOUS | @ 13:00:00 | NDC 24200020313

## 2010-09-21 MED ADMIN — ephedrine (MISTOLE) 50 mg/mL injection 10 mg: INTRAVENOUS | @ 18:00:00 | NDC 17478051500

## 2010-09-21 MED ADMIN — oxytocin (PITOCIN) 30 units/500 mL D5W: INTRAVENOUS | @ 18:00:00 | NDC 24200020313

## 2010-09-21 MED ADMIN — lactated ringers infusion: INTRAVENOUS | @ 13:00:00 | NDC 00409795309

## 2010-09-21 MED ADMIN — oxytocin (PITOCIN) 30 units/500 mL D5W: INTRAVENOUS | @ 17:00:00 | NDC 24200020313

## 2010-09-21 MED ADMIN — dinoprostone (CERVIDIL) 10 mg vaginal insert 10 mg: VAGINAL | @ 02:00:00 | NDC 00456412363

## 2010-09-21 MED ADMIN — oxytocin (PITOCIN) 30 units/500 mL D5W: INTRAVENOUS | @ 14:00:00 | NDC 24200020313

## 2010-09-21 MED ADMIN — fentanyl 2mcg/mL - bupivacaine 0.125% pf epidural: EPIDURAL | @ 15:00:00 | NDC 02420043204

## 2010-09-21 MED ADMIN — fentanyl 2mcg/mL - bupivacaine 0.125% pf epidural: EPIDURAL | @ 21:00:00 | NDC 02420043204

## 2010-09-21 MED ADMIN — oxytocin (PITOCIN) 30 units/500 mL D5W: INTRAVENOUS | @ 12:00:00 | NDC 24200020313

## 2010-09-21 MED ADMIN — lactated ringers infusion: INTRAVENOUS | @ 14:00:00 | NDC 00409795309

## 2010-09-21 MED ADMIN — lactated ringers infusion: INTRAVENOUS | @ 11:00:00 | NDC 00409795309

## 2010-09-21 MED FILL — EPHEDRINE SULFATE 50 MG/ML IJ SOLN: 50 mg/mL | INTRAMUSCULAR | Qty: 1

## 2010-09-21 MED FILL — OXYTOCIN 20 UNITS/1000 ML LACTATED RINGERS: INTRAVENOUS | Qty: 1000

## 2010-09-21 MED FILL — BUPIVACAINE-EPINEPHRINE (PF) 0.25 %-1:200,000 IJ SOLN: 0.25 %-1:200,000 | INTRAMUSCULAR | Qty: 30

## 2010-09-21 MED FILL — FENTANYL-BUPIVACAINE IN NS (PF) 2 MCG/ML-0.125 % PUMP RESERV,PREFILLED: 2 mcg/mL- 0.15 % | EPIDURAL | Qty: 100

## 2010-09-21 NOTE — Procedures (Signed)
OB Combined Spinal-Epidural Note    This patient has had prior back surgery and has a 10 inch long scar in the lumbar region.    Risk and benefits were discussed with the patient including difficult placement secondary to scar tissue as well as accidental wet tap and plans are to proceed.     Patient was placed in the seated position.     The back was prepped at the lumbar region with Duraprep.     0.25% bupivacaine with 1:200K epinephrine  was used as local at L2 - L3.     A 17 Tuohy needle was passed x 1 attempt(s) at the above stated level.     Loss of resistance @ 5 cm.      A 25 g pencil point spinal needle was placed through the Touhy until CSF was obtained.    0.6 mL 0.25% bupivacaine with 1:200K epinephrine was deposited into the CSF.    A 19 g spring type epidural catheter was placed through the Touhy and secured at 9 cm at the skin.     Test dose with 3 mL 0.25% bupivacaine with 1:200 epinephrine was negative.    Negative paresthesia.    Catheter was secured with tegaderm and tape.

## 2010-09-21 NOTE — Progress Notes (Signed)
Pt repositioned to left side and bolus given, called Dr. Bethena Midget regarding variable decels he will review strip and call back.

## 2010-09-21 NOTE — Progress Notes (Signed)
efm and toco removed. Pt up to shower.

## 2010-09-21 NOTE — Progress Notes (Signed)
Cervidil removed without difficulty.

## 2010-09-21 NOTE — Progress Notes (Signed)
Strip reviewed with Coral Ceo RN, pitocin put back to 6

## 2010-09-21 NOTE — Progress Notes (Signed)
OOB to the BR   Void qs
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
## 2010-09-21 NOTE — Progress Notes (Signed)
Pt placed on the monitor at this time, spoke with Dr. Rosine Door regarding pitocin, will begin at 8 per his order

## 2010-09-21 NOTE — Progress Notes (Signed)
TRANSFER - OUT REPORT:    Verbal report given to Dellis Filbert  on Denise Huber  being transferred to [facility]  for routine progression of care       Report consisted of patient???s Situation, Background, Assessment and   Recommendations(SBAR).     Information from the following report(s) SBAR, Procedure Summary and Intake/Output was reviewed with the receiving nurse.    Opportunity for questions and clarification was provided.

## 2010-09-21 NOTE — Progress Notes (Addendum)
Pt repositioned to semi fowlers toco adjusted at this time, abad palpates soft at this time

## 2010-09-21 NOTE — Progress Notes (Signed)
Spoke with Dr. Bethena Midget regarding variable decels pitocin remains at 6, MD aware. He will be over to access.

## 2010-09-21 NOTE — Procedures (Signed)
OB Combined Spinal-Epidural Note    This patient has had prior back surgery and has a 10 inch long scar in the lumbar region.    Risk and benefits were discussed with the patient including difficult placement secondary to scar tissue as well as accidental wet tap and plans are to proceed.     Patient was placed in the seated position.     The back was prepped at the lumbar region with Duraprep.     0.25% bupivacaine with 1:200K epinephrine  was used as local at L2 - L3.     A 17 Tuohy needle was passed x 1 attempt(s) at the above stated level.     Loss of resistance @ 5 cm.      A 25 g pencil point spinal needle was placed through the Touhy until CSF was obtained.    0.6 mL 0.25% bupivacaine with 1:200K epinephrine was deposited into the CSF.    A 19 g spring type epidural catheter was placed through the Touhy and secured at 9 cm at the skin.     Test dose with 3 mL 0.25% bupivacaine with 1:200 epinephrine was negative.    Negative paresthesia.    Catheter was secured with tegaderm and tape.

## 2010-09-21 NOTE — Progress Notes (Signed)
Pt to RR fhr 124

## 2010-09-21 NOTE — Progress Notes (Signed)
toco adjusted abd palpates soft at this time

## 2010-09-21 NOTE — Progress Notes (Signed)
toco adjusted abd palpates soft at this time.

## 2010-09-21 NOTE — Progress Notes (Signed)
Dr. Isla Pence at bedside for epidural, timeout done

## 2010-09-21 NOTE — Progress Notes (Signed)
Spoke with Dr Bethena Midget about patients strip and variables, Dr viewed strip. Dr at [facility] office.

## 2010-09-21 NOTE — Progress Notes (Signed)
Pt repositioned to right side, toco adjusted abd palpates soft

## 2010-09-21 NOTE — Progress Notes (Signed)
Pt repositioned to left side after variable decel fhr 121

## 2010-09-21 NOTE — Progress Notes (Signed)
Pt repositioned back to semi fowlers, following variable decels to the 80's  fhr 131

## 2010-09-21 NOTE — Progress Notes (Addendum)
Spoke with Dr. Rosine Door regarding strip be will review and be over to access the pt, pitocin to remain at 6 pr his order

## 2010-09-21 NOTE — Progress Notes (Signed)
toco zeroed

## 2010-09-21 NOTE — Progress Notes (Signed)
Pt repositioned to left side and bolus given

## 2010-09-21 NOTE — Progress Notes (Addendum)
Pt BP running low 87/46, pt reports nausea at this time 5 mg of ephedrine given BP now 93/51

## 2010-09-21 NOTE — Progress Notes (Signed)
efm and toco adjusted abd palpates soft at this time

## 2010-09-21 NOTE — Progress Notes (Signed)
Dr. Rosine Door at bedside for SVE, AROM, and IUPC and fse placed, strip reviewed will continue to titrate pitocin at this time per Dr. Rosine Door

## 2010-09-21 NOTE — Progress Notes (Signed)
Epi in  Pt laying down.

## 2010-09-22 MED ADMIN — ibuprofen (MOTRIN) tablet 800 mg: ORAL | @ 05:00:00 | NDC 63739044210

## 2010-09-22 MED ADMIN — ibuprofen (MOTRIN) tablet 800 mg: ORAL | @ 14:00:00 | NDC 63739044210

## 2010-09-22 MED ADMIN — ibuprofen (MOTRIN) tablet 800 mg: ORAL | @ 21:00:00 | NDC 63739044210

## 2010-09-22 MED FILL — BUPIVACAINE-EPINEPHRINE (PF) 0.25 %-1:200,000 IJ SOLN: 0.25 %-1:200,000 | INTRAMUSCULAR | Qty: 30

## 2010-09-22 NOTE — Progress Notes (Signed)
In to assess pt, infant breastfeeding.

## 2010-09-22 NOTE — Progress Notes (Signed)
In to assess pt, pt breastfeeding.

## 2010-09-22 NOTE — Progress Notes (Signed)
Post-Partum Day Number 1 Progress Note    Patient doing well post-partum without significant complaint.  Voiding withour difficulty, normal lochia.    Vitals:  Patient Vitals in the past 8 hrs:   BP Temp Pulse Resp   09/22/10 0400 105/71 mmHg 97.7 ??F (36.5 ??C) 76  18    09/22/10 0030 104/69 mmHg 98.3 ??F (36.8 ??C) 89  18        Temp (24hrs), Avg:98.3 ??F (36.8 ??C), Min:97.6 ??F (36.4 ??C), Max:99.1 ??F (37.3 ??C)      Vital signs stable, afebrile.    Exam:  Patient without distress.               Abdomen soft, fundus firm at level of umbilicus, nontender               Lower extremities are negative for swelling, cords or tenderness.    Labs: No results found for this or any previous visit (from the past 24 hour(s)).    Assessment and Plan:  Patient appears to be having uncomplicated post-partum course.  Continue routine perineal care and maternal education.  Plan discharge tomorrow if no problems occur.

## 2010-09-22 NOTE — Progress Notes (Signed)
Rep. Fr. H. Barnett compl. At [phone]

## 2010-09-22 NOTE — Progress Notes (Signed)
Report received from S. Felton, RN. Patient in stable condition. Call bell in reach.

## 2010-09-22 NOTE — Progress Notes (Signed)
Report given to C. Sale, RN. In stable condition. All questions answered.

## 2010-09-23 MED ORDER — HYDROCODONE-ACETAMINOPHEN 5 MG-500 MG TAB
5-500 mg | ORAL_TABLET | ORAL | Status: DC | PRN
Start: 2010-09-23 — End: 2013-03-13

## 2010-09-23 MED ORDER — IBUPROFEN 600 MG TAB
600 mg | ORAL_TABLET | Freq: Four times a day (QID) | ORAL | Status: DC | PRN
Start: 2010-09-23 — End: 2013-03-13

## 2010-09-23 MED ORDER — IBUPROFEN 600 MG TAB
600 mg | ORAL_TABLET | Freq: Four times a day (QID) | ORAL | Status: DC | PRN
Start: 2010-09-23 — End: 2014-10-29

## 2010-09-23 MED ADMIN — ibuprofen (MOTRIN) tablet 800 mg: ORAL | @ 13:00:00 | NDC 63739044210

## 2010-09-23 MED ADMIN — oxycodone-acetaminophen (PERCOCET) 5-325 mg per tablet 1 Tab: ORAL | @ 04:00:00 | NDC 00406051262

## 2010-09-23 MED ADMIN — ibuprofen (MOTRIN) tablet 800 mg: ORAL | @ 06:00:00 | NDC 63739044210

## 2010-09-23 NOTE — Progress Notes (Signed)
Post-Partum Day Number 2 Progress/Discharge Note    Patient doing well post-partum without significant complaint.  Voiding without difficulty, normal lochia, positive flatus.    Vitals:  Patient Vitals in the past 8 hrs:   BP Temp Pulse Resp   09/22/10 2330 105/64 mmHg 98.4 ??F (36.9 ??C) 80  18        Temp (24hrs), Avg:98.1 ??F (36.7 ??C), Min:97.7 ??F (36.5 ??C), Max:98.4 ??F (36.9 ??C)      Vital signs stable, afebrile.    Exam:  Patient without distress.               Abdomen soft, fundus firm at level of umbilicus, non tender               Perineum with normal lochia noted.               Lower extremities are negative for swelling, cords or tenderness.    Lab/Data Review:  All lab results for the last 24 hours reviewed.    Assessment and Plan:  Patient appears to be having uncomplicated post-partum course.  Continue routine perineal care and maternal education.  Plan discharge for today with follow up in our office in 6 weeks.

## 2010-09-23 NOTE — Progress Notes (Signed)
Volunteers to floor to escort mother and infant from unit. All questions answered.

## 2010-09-23 NOTE — Progress Notes (Signed)
Discharge instructions given to patient, patient states understanding of appropriate followup care and use of prescriptions for Motrin 600mg  and Vicoden as prescribed.  She states Dr. Bethena Midget had said he would write a prescription for her complaint of sore throat/laryngitis.  Nurse will call office to followup. Currently awaiting infant's discharge orders. She will call her ride who is bringing a car seat with them.

## 2010-09-23 NOTE — Progress Notes (Signed)
Manager review. After review of delivery summary, used "marked as delivered" to note completion of delivery summary and pt. Is no longer pregnant.

## 2010-09-23 NOTE — Progress Notes (Signed)
Dr. Bethena Midget advised of patient complaint of sore throat/laryngitis.  He advised to request patient to come to office following discharge.  Patient advised.

## 2013-03-13 NOTE — Patient Instructions (Addendum)
Start a fish oil supplement - get the "triple strength" capsules and take 3 daily if you can.  In the long run when you are feeling better taking one triple strength a day should be adequate maintenance amount.     Consider a multivitamin/mineral initially as well - the additional B vitamins and minerals may help the prescription medicine work better.      Try to get daily exercise.     For sleep - consider taking melatonin 3-6 mg 1 hour before bedtime if needed.   Keep the same bedtime on weekdays and weekends and try to get up the same time everyday as well.     If your think you want to try a sleep medicine we could use a non-habit forming sleep aid (trazodone, doxepin).     Return in 1 month to reassess how you are doing (3-4 weeks)
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
## 2014-10-29 ENCOUNTER — Ambulatory Visit
Admit: 2014-10-29 | Discharge: 2014-10-29 | Payer: PRIVATE HEALTH INSURANCE | Attending: Family Medicine | Primary: Family Medicine

## 2014-10-29 DIAGNOSIS — R0789 Other chest pain: Secondary | ICD-10-CM

## 2014-10-29 MED ORDER — NABUMETONE 500 MG TAB
500 mg | ORAL_TABLET | Freq: Two times a day (BID) | ORAL | Status: DC | PRN
Start: 2014-10-29 — End: 2015-11-20
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
## 2014-10-29 NOTE — Patient Instructions (Signed)
You may try a different anti-inflammatory for pain (nabumetone) - this may be easier on the intestines.      Stretch your chest wall muscles a few times a day.     [facility]  Marlaine Hind   [address]  [place], [place] [address]  [phone] or [email or web address] [email or web address]

## 2014-10-29 NOTE — Progress Notes (Signed)
Chief Complaint   Patient presents with   ??? Other     [facility]. [facility]20-2014   ??? Chest Pain     left side, started 10-17-2014. patient stated that pain is worst at night, unable to go back to sleep and hard to breath.       1. Have you been to the ER, urgent care clinic since your last visit?  Hospitalized since your last visit? Yes, [facility], 10-20-2014 for chest pain    2. Have you seen or consulted any other health care providers outside of the [facility] since your last visit?  Include any pap smears or colon screening. Yes    Patient denied getting the flu shot.

## 2014-12-06 ENCOUNTER — Inpatient Hospital Stay: Admit: 2014-12-10 | Payer: PRIVATE HEALTH INSURANCE | Primary: Family Medicine

## 2014-12-06 ENCOUNTER — Ambulatory Visit
Admit: 2014-12-06 | Discharge: 2014-12-06 | Payer: PRIVATE HEALTH INSURANCE | Attending: Family Medicine | Primary: Family Medicine

## 2014-12-06 DIAGNOSIS — IMO0001 Reserved for inherently not codable concepts without codable children: Secondary | ICD-10-CM

## 2014-12-06 DIAGNOSIS — Z3009 Encounter for other general counseling and advice on contraception: Secondary | ICD-10-CM

## 2014-12-06 MED ORDER — NORGESTIMATE-ETHINYL ESTRADIOL 0.25 MG-35 MCG TAB
ORAL_TABLET | Freq: Every day | ORAL | Status: DC
Start: 2014-12-06 — End: 2016-02-02

## 2014-12-06 NOTE — Patient Instructions (Addendum)
Combination Birth Control Pills: Care Instructions  Your Care Instructions     Combination birth control pills are used to prevent pregnancy. They give you a regular dose of the hormones estrogen and progestin.  You take a hormone pill every day to prevent pregnancy.  Birth control pills come in packs. The most common type has 3 weeks of hormone pills. Some packs have sugar pills (they do not contain any hormones) for the fourth week. During that fourth no-hormone week, you have your period. After the fourth week (28 days), you start a new pack.  Some birth control pills are packaged in different ways. For example, some have hormone pills for the fourth week instead of sugar pills. Taking hormones for the entire month causes you to not have periods or to have fewer periods. Others are packaged so that you have a period every 3 months. Your doctor will tell you what type of pills you have.  Follow-up care is a key part of your treatment and safety. Be sure to make and go to all appointments, and call your doctor if you are having problems. It's also a good idea to know your test results and keep a list of the medicines you take.  How can you care for yourself at home?  How do you take the pill?   ?? Follow your doctor's instructions about when to start taking your pills. Use backup birth control, such as a condom, or don't have intercourse for 7 days after you start your pills.  ?? Take your pills every day, at about the same time of day. To help yourself do this, try to take them when you do something else every day, such as brushing your teeth.  What if you forget to take a pill?   Always read the label for specific instructions, or call your doctor. Here are some basic guidelines:  ?? If you miss 1 hormone pill, take it as soon as you remember. Ask your doctor if you may need to use a backup birth control method, such as a condom, or not have intercourse.   ?? If you miss 2 or more hormone pills, take one as soon as you remember you forgot them. Then read the pill label or call your doctor about instructions on how to take your missed pills. Use a backup method of birth control or don't have intercourse for 7 days. Pregnancy is more likely if you miss more than 1 pill.  ?? If you had intercourse, you can use emergency contraception, such as the morning-after pill (Plan B). You can use emergency contraception for up to 5 days after having had intercourse, but it works best if you take it right away.  What else do you need to know?   ?? The pill has side effects.  ?? You may have very light or skipped periods.  ?? You may have bleeding between periods (spotting). This usually decreases after 3 to 4 months.  ?? You may have mood changes, less interest in sex, or weight gain.  ?? The pill may reduce acne, heavy bleeding and cramping, and symptoms of premenstrual syndrome.  ?? Check with your doctor before you use any other medicines, including over-the-counter medicines, vitamins, herbal products, and supplements. Birth control hormones may not work as well to prevent pregnancy when combined with other medicines.  ?? The pill doesn't protect against sexually transmitted infection (STIs), such as herpes or HIV/AIDS. If you're not sure whether your sex partner might   have an STI, use a condom to protect against disease.  When should you call for help?  Call your doctor now or seek immediate medical care if:  ?? You have severe belly pain.  ?? You have signs of a blood clot, such as:  ?? Pain in your calf, back of the knee, thigh, or groin.  ?? Redness and swelling in your leg or groin.  ?? You have blurred vision or other problems seeing.  ?? You have a severe headache.  ?? You have severe trouble breathing.  Watch closely for changes in your health, and be sure to contact your doctor if:  ?? You think you might be pregnant.  ?? You think you may be depressed.   ?? You think you may have been exposed to or have a sexually transmitted infection.   Where can you learn more?   Go to [email or web address]  Enter Z218 in the search box to learn more about "Combination Birth Control Pills: Care Instructions."   ?? 2006-2015 [employer]. Care instructions adapted under license by [employer] (which disclaims liability or warranty for this information). This care instruction is for use with your licensed healthcare professional. If you have questions about a medical condition or this instruction, always ask your healthcare professional. Healthwise, Incorporated disclaims any warranty or liability for your use of this information.  Content Version: 10.7.482551; Current as of: Apr 12, 2014      They should be $9 per month at [employer], [employer] or [employer] (without insurance - just paying directly)

## 2014-12-06 NOTE — Progress Notes (Signed)
Chief Complaint   Patient presents with   ??? Complete Physical   No problems.  Wants to discuss BCP's.    1. Have you been to the ER, urgent care clinic since your last visit?  Hospitalized since your last visit?No    2. Have you seen or consulted any other health care providers outside of the [facility] since your last visit?  Include any pap smears or colon screening. No
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
## 2014-12-09 NOTE — Telephone Encounter (Signed)
[facility] is processing PAP smear on [employer].  They need an ICD10 code that will work to process this, as the one we have used will not process.  Please call Raynelle Fanning @[phone].

## 2014-12-09 NOTE — Telephone Encounter (Signed)
Is it okay to use the ICD Code Z01.41 for pap smear

## 2014-12-09 NOTE — Telephone Encounter (Signed)
I added Z12.4 (cervical cancer screening) then ok to use Z01.411

## 2014-12-10 NOTE — Telephone Encounter (Signed)
Spoke to [place] at [employer] and informed her per Dr. Adriana Simas that she can use the Z12.4 or Z01.411 ICD 10 code

## 2014-12-12 LAB — CHLAMYDIA / GC-AMPLIFIED
Chlamydia trachomatis, NAA: NEGATIVE
Neisseria gonorrhoeae, NAA: NEGATIVE

## 2014-12-12 NOTE — Progress Notes (Signed)
Quick Note:        Please call - the pap was normal and there was no gonorrhea or chlamydia infection detected. She may have bacterial vaginosis (an imbalance of the vaginal bacteria) this is only of concern if a woman has vaginal odor or discharge or is pregnant. Let me know if she feels that she needs treatment for this and I will send a week of metronidazole (twice daily) to the pharmacy.    ______

## 2014-12-17 NOTE — Progress Notes (Signed)
Quick Note:        Left message for patient to call the office.    ______

## 2014-12-18 NOTE — Progress Notes (Signed)
Quick Note:        Left message for patient to call the office.    ______

## 2014-12-19 MED ORDER — METRONIDAZOLE 500 MG TAB
500 mg | ORAL_TABLET | Freq: Two times a day (BID) | ORAL | Status: AC
Start: 2014-12-19 — End: 2014-12-26

## 2014-12-19 NOTE — Telephone Encounter (Signed)
Please inform her Rx for metronidazole was sent to the pharmacy.  She should not drink alcohol while taking this (the combination will make her feel sick)

## 2014-12-19 NOTE — Progress Notes (Signed)
Quick Note:        Spoke with patient, ID verified X 2. Informed patient of pap results and recommendations per Dr. Adriana Simas. Patient verbalized understanding and stated that she would like the metronidazole sent to her pharmacy.    ______

## 2014-12-19 NOTE — Telephone Encounter (Signed)
Left message on patient's voicemail that script was sent to pharmacy and did not consume alcohol while taking this medication. Also, call the office if she has any questions.

## 2014-12-19 NOTE — Telephone Encounter (Signed)
-----   Message from Christene Slates, LPN sent at 6/56/8127  8:27 AM EST -----  Spoke with patient, ID verified X 2. Informed patient of pap results and recommendations per Dr. Adriana Simas. Patient verbalized understanding and stated that she would like the metronidazole sent to her pharmacy.

## 2015-01-07 NOTE — Telephone Encounter (Signed)
Patient is looking for forms that need to be completed.  These were dropped off on Friday, 01/03/2015.  Please advise patient of status of forms.  [phone].

## 2015-01-08 NOTE — Telephone Encounter (Signed)
Patient will come in Friday to pick up form.

## 2015-11-20 ENCOUNTER — Ambulatory Visit
Admit: 2015-11-20 | Discharge: 2015-11-20 | Payer: PRIVATE HEALTH INSURANCE | Attending: Family Medicine | Primary: Family Medicine

## 2015-11-20 DIAGNOSIS — M545 Low back pain, unspecified: Secondary | ICD-10-CM

## 2015-11-20 MED ORDER — TRAMADOL 50 MG TAB
50 mg | ORAL_TABLET | Freq: Four times a day (QID) | ORAL | 0 refills | Status: DC | PRN
Start: 2015-11-20 — End: 2017-12-08

## 2015-11-20 MED ORDER — NABUMETONE 500 MG TAB
500 mg | ORAL_TABLET | Freq: Two times a day (BID) | ORAL | 0 refills | Status: AC | PRN
Start: 2015-11-20 — End: 2015-11-30

## 2015-11-20 MED ORDER — CYCLOBENZAPRINE 10 MG TAB
10 mg | ORAL_TABLET | Freq: Three times a day (TID) | ORAL | 0 refills | Status: AC | PRN
Start: 2015-11-20 — End: 2015-11-30

## 2015-11-20 NOTE — Progress Notes (Signed)
Denise Huber is a 29 y.o. female  Chief Complaint   Patient presents with   ??? Spasms     Muscle spasms in back x2 weeks.     1. Have you been to an emergency room, urgent clinic, or hospitalized since your last visit?  YES  If yes, where when, and reason for visit?     [facility]: 08/2015 - upper respiratory issue and back spasms, No meds Rx per pt  [facility] 10/2015 - for back spasms, no meds Rx per pt    2. Have seen or consulted any other health care provider since your last visit? NO  Please include any pap smears or colon screening in this section  If yes, where when, and reason for visit?        6. Do you have an Advanced Directive/ Living Will in place? NO  If yes, do we have a copy on file NO  If no, would you like information NO
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
## 2015-11-26 NOTE — Progress Notes (Signed)
Form rec'd from the [facility] completed and returned to them via fax at [phone].  Patient has an appt with Dr Gardiner Fanti at [facility] but it is not until 12-11-15. She is on a cancellation list and hopefully this appt will be move. Patient advised that the answers to many of the questions on the form are "unknown" until the ortho sees and evaluates.

## 2016-02-02 NOTE — Telephone Encounter (Signed)
She is due in to establish with Dr Maisie Fus as soon as this is possible. Last well adult visit was in Jan 2016 and she is not on the schedule yet to see Dr Maisie Fus

## 2016-02-03 NOTE — Telephone Encounter (Signed)
02/03/16 LM for pt to call back & sched. NP appt.  dw

## 2016-02-06 MED ORDER — NORGESTIMATE-ETHINYL ESTRADIOL 0.25 MG-35 MCG TAB
PACK | Freq: Every day | ORAL | 0 refills | Status: DC
Start: 2016-02-06 — End: 2017-12-08

## 2016-11-22 NOTE — L&D Delivery Note (Addendum)
Delivery Note At 5:29 AM a viable female was delivered with Vtx, Ant and post shoulder and body via Vaginal, Spontaneous Delivery. (Presentation: OA ).  APGAR: 8, 9; weight  .  Spont del of placenta at 22. Placenta status: intact with velamentous insertion of the Cord. +crying, +resp, +FHR >120. Hemostasis achieved. No sponges and 1 lap pad. No needles used. All counts correct. VSS. FF and lochia mod.   Anesthesia: None  Episiotomy: None Lacerations: None Suture Repair: None Est. Blood Loss (mL): 725 (includes some amniotic fluid and blood and clots)  Mom to pp.  Baby to mom for skin to skin bonding.   Carmen Prince 08/31/2017, 6:58 AM

## 2017-03-15 LAB — AFP TETRA: AFP: NEGATIVE

## 2017-05-17 LAB — CYTOLOGY - PAP: Pap: NEGATIVE

## 2017-05-17 LAB — OB RESULTS CONSOLE ANTIBODY SCREEN: Antibody Screen: NEGATIVE

## 2017-05-17 LAB — OB RESULTS CONSOLE ABO/RH: RH TYPE: POSITIVE

## 2017-05-17 LAB — OB RESULTS CONSOLE HIV ANTIBODY (ROUTINE TESTING): HIV: NONREACTIVE

## 2017-05-17 LAB — OB RESULTS CONSOLE HGB/HCT, BLOOD
HEMATOCRIT: 31
Hemoglobin: 10.1

## 2017-05-17 LAB — OB RESULTS CONSOLE RUBELLA ANTIBODY, IGM: Rubella: IMMUNE

## 2017-05-17 LAB — OB RESULTS CONSOLE PLATELET COUNT: PLATELETS: 329

## 2017-05-17 LAB — OB RESULTS CONSOLE HEPATITIS B SURFACE ANTIGEN: Hepatitis B Surface Ag: NEGATIVE

## 2017-05-17 LAB — OB RESULTS CONSOLE GC/CHLAMYDIA
Chlamydia: NEGATIVE
Gonorrhea: NEGATIVE

## 2017-05-17 LAB — GLUCOSE TOLERANCE, 1 HOUR: GTT, 1 HR: 122

## 2017-05-17 LAB — OB RESULTS CONSOLE RPR: RPR: NONREACTIVE

## 2017-06-28 ENCOUNTER — Encounter: Payer: Self-pay | Admitting: Obstetrics & Gynecology

## 2017-06-28 ENCOUNTER — Ambulatory Visit (INDEPENDENT_AMBULATORY_CARE_PROVIDER_SITE_OTHER): Payer: PRIVATE HEALTH INSURANCE | Admitting: Obstetrics & Gynecology

## 2017-06-28 VITALS — BP 122/74 | HR 99 | Ht 70.0 in | Wt 226.0 lb

## 2017-06-28 DIAGNOSIS — Z3480 Encounter for supervision of other normal pregnancy, unspecified trimester: Secondary | ICD-10-CM

## 2017-06-28 DIAGNOSIS — Z3689 Encounter for other specified antenatal screening: Secondary | ICD-10-CM

## 2017-06-28 DIAGNOSIS — Z23 Encounter for immunization: Secondary | ICD-10-CM | POA: Diagnosis not present

## 2017-06-28 DIAGNOSIS — J45909 Unspecified asthma, uncomplicated: Secondary | ICD-10-CM | POA: Insufficient documentation

## 2017-06-28 DIAGNOSIS — Z3493 Encounter for supervision of normal pregnancy, unspecified, third trimester: Secondary | ICD-10-CM | POA: Insufficient documentation

## 2017-06-28 DIAGNOSIS — O4692 Antepartum hemorrhage, unspecified, second trimester: Secondary | ICD-10-CM

## 2017-06-28 DIAGNOSIS — Z9889 Other specified postprocedural states: Secondary | ICD-10-CM | POA: Insufficient documentation

## 2017-06-28 MED ORDER — PRENATAL/IRON PO TABS
1.0000 | ORAL_TABLET | Freq: Every day | ORAL | 3 refills | Status: DC
Start: 2017-06-28 — End: 2017-08-10

## 2017-06-28 NOTE — Patient Instructions (Signed)
[facility]    Third Trimester of Pregnancy The third trimester is from week 28 through week 40 (months 7 through 9). The third trimester is a time when the unborn baby (fetus) is growing rapidly. At the end of the ninth month, the fetus is about 20 inches in length and weighs 6-10 pounds. Body changes during your third trimester Your body will continue to go through many changes during pregnancy. The changes vary from woman to woman. During the third trimester:  Your weight will continue to increase. You can expect to gain 25-35 pounds (11-16 kg) by the end of the pregnancy.  You may begin to get stretch marks on your hips, abdomen, and breasts.  You may urinate more often because the fetus is moving lower into your pelvis and pressing on your bladder.  You may develop or continue to have heartburn. This is caused by increased hormones that slow down muscles in the digestive tract.  You may develop or continue to have constipation because increased hormones slow digestion and cause the muscles that push waste through your intestines to relax.  You may develop hemorrhoids. These are swollen veins (varicose veins) in the rectum that can itch or be painful.  You may develop swollen, bulging veins (varicose veins) in your legs.  You may have increased body aches in the pelvis, back, or thighs. This is due to weight gain and increased hormones that are relaxing your joints.  You may have changes in your hair. These can include thickening of your hair, rapid growth, and changes in texture. Some women also have hair loss during or after pregnancy, or hair that feels dry or thin. Your hair will most likely return to normal after your baby is born.  Your breasts will continue to grow and they will continue to become tender. A yellow fluid (colostrum) may leak from your breasts. This is the first milk you are producing for your baby.  Your belly button may stick out.  You may notice  more swelling in your hands, face, or ankles.  You may have increased tingling or numbness in your hands, arms, and legs. The skin on your belly may also feel numb.  You may feel short of breath because of your expanding uterus.  You may have more problems sleeping. This can be caused by the size of your belly, increased need to urinate, and an increase in your body's metabolism.  You may notice the fetus "dropping," or moving lower in your abdomen (lightening).  You may have increased vaginal discharge.  You may notice your joints feel loose and you may have pain around your pelvic bone.  What to expect at prenatal visits You will have prenatal exams every 2 weeks until week 36. Then you will have weekly prenatal exams. During a routine prenatal visit:  You will be weighed to make sure you and the baby are growing normally.  Your blood pressure will be taken.  Your abdomen will be measured to track your baby's growth.  The fetal heartbeat will be listened to.  Any test results from the previous visit will be discussed.  You may have a cervical check near your due date to see if your cervix has softened or thinned (effaced).  You will be tested for Group B streptococcus. This happens between 35 and 37 weeks.  Your health care provider may ask you:  What your birth plan is.  How you are feeling.  If you are feeling the baby move.  If you have had any abnormal symptoms, such as leaking fluid, bleeding, severe headaches, or abdominal cramping.  If you are using any tobacco products, including cigarettes, chewing tobacco, and electronic cigarettes.  If you have any questions.  Other tests or screenings that may be performed during your third trimester include:  Blood tests that check for low iron levels (anemia).  Fetal testing to check the health, activity level, and growth of the fetus. Testing is done if you have certain medical conditions or if there are problems  during the pregnancy.  Nonstress test (NST). This test checks the health of your baby to make sure there are no signs of problems, such as the baby not getting enough oxygen. During this test, a belt is placed around your belly. The baby is made to move, and its heart rate is monitored during movement.  What is false labor? False labor is a condition in which you feel small, irregular tightenings of the muscles in the womb (contractions) that usually go away with rest, changing position, or drinking water. These are called Braxton Hicks contractions. Contractions may last for hours, days, or even weeks before true labor sets in. If contractions come at regular intervals, become more frequent, increase in intensity, or become painful, you should see your health care provider. What are the signs of labor?  Abdominal cramps.  Regular contractions that start at 10 minutes apart and become stronger and more frequent with time.  Contractions that start on the top of the uterus and spread down to the lower abdomen and back.  Increased pelvic pressure and dull back pain.  A watery or bloody mucus discharge that comes from the vagina.  Leaking of amniotic fluid. This is also known as your "water breaking." It could be a slow trickle or a gush. Let your health care provider know if it has a color or strange odor. If you have any of these signs, call your health care provider right away, even if it is before your due date. Follow these instructions at home: Medicines  Follow your health care provider's instructions regarding medicine use. Specific medicines may be either safe or unsafe to take during pregnancy.  Take a prenatal vitamin that contains at least 600 micrograms (mcg) of folic acid.  If you develop constipation, try taking a stool softener if your health care provider approves. Eating and drinking  Eat a balanced diet that includes fresh fruits and vegetables, whole grains, good sources  of protein such as meat, eggs, or tofu, and low-fat dairy. Your health care provider will help you determine the amount of weight gain that is right for you.  Avoid raw meat and uncooked cheese. These carry germs that can cause birth defects in the baby.  If you have low calcium intake from food, talk to your health care provider about whether you should take a daily calcium supplement.  Eat four or five small meals rather than three large meals a day.  Limit foods that are high in fat and processed sugars, such as fried and sweet foods.  To prevent constipation: ? Drink enough fluid to keep your urine clear or pale yellow. ? Eat foods that are high in fiber, such as fresh fruits and vegetables, whole grains, and beans. Activity  Exercise only as directed by your health care provider. Most women can continue their usual exercise routine during pregnancy. Try to exercise for 30 minutes at least 5 days a week. Stop exercising if you experience uterine  contractions.  Avoid heavy lifting.  Do not exercise in extreme heat or humidity, or at high altitudes.  Wear low-heel, comfortable shoes.  Practice good posture.  You may continue to have sex unless your health care provider tells you otherwise. Relieving pain and discomfort  Take frequent breaks and rest with your legs elevated if you have leg cramps or low back pain.  Take warm sitz baths to soothe any pain or discomfort caused by hemorrhoids. Use hemorrhoid cream if your health care provider approves.  Wear a good support bra to prevent discomfort from breast tenderness.  If you develop varicose veins: ? Wear support pantyhose or compression stockings as told by your healthcare provider. ? Elevate your feet for 15 minutes, 3-4 times a day. Prenatal care  Write down your questions. Take them to your prenatal visits.  Keep all your prenatal visits as told by your health care provider. This is important. Safety  Wear your seat  belt at all times when driving.  Make a list of emergency phone numbers, including numbers for family, friends, the hospital, and police and fire departments. General instructions  Avoid cat litter boxes and soil used by cats. These carry germs that can cause birth defects in the baby. If you have a cat, ask someone to clean the litter box for you.  Do not travel far distances unless it is absolutely necessary and only with the approval of your health care provider.  Do not use hot tubs, steam rooms, or saunas.  Do not drink alcohol.  Do not use any products that contain nicotine or tobacco, such as cigarettes and e-cigarettes. If you need help quitting, ask your health care provider.  Do not use any medicinal herbs or unprescribed drugs. These chemicals affect the formation and growth of the baby.  Do not douche or use tampons or scented sanitary pads.  Do not cross your legs for long periods of time.  To prepare for the arrival of your baby: ? Take prenatal classes to understand, practice, and ask questions about labor and delivery. ? Make a trial run to the hospital. ? Visit the hospital and tour the maternity area. ? Arrange for maternity or paternity leave through employers. ? Arrange for family and friends to take care of pets while you are in the hospital. ? Purchase a rear-facing car seat and make sure you know how to install it in your car. ? Pack your hospital bag. ? Prepare the baby's nursery. Make sure to remove all pillows and stuffed animals from the baby's crib to prevent suffocation.  Visit your dentist if you have not gone during your pregnancy. Use a soft toothbrush to brush your teeth and be gentle when you floss. Contact a health care provider if:  You are unsure if you are in labor or if your water has broken.  You become dizzy.  You have mild pelvic cramps, pelvic pressure, or nagging pain in your abdominal area.  You have lower back pain.  You have  persistent nausea, vomiting, or diarrhea.  You have an unusual or bad smelling vaginal discharge.  You have pain when you urinate. Get help right away if:  Your water breaks before 37 weeks.  You have regular contractions less than 5 minutes apart before 37 weeks.  You have a fever.  You are leaking fluid from your vagina.  You have spotting or bleeding from your vagina.  You have severe abdominal pain or cramping.  You have rapid weight  loss or weight gain.  You have shortness of breath with chest pain.  You notice sudden or extreme swelling of your face, hands, ankles, feet, or legs.  Your baby makes fewer than 10 movements in 2 hours.  You have severe headaches that do not go away when you take medicine.  You have vision changes. Summary  The third trimester is from week 28 through week 40, months 7 through 9. The third trimester is a time when the unborn baby (fetus) is growing rapidly.  During the third trimester, your discomfort may increase as you and your baby continue to gain weight. You may have abdominal, leg, and back pain, sleeping problems, and an increased need to urinate.  During the third trimester your breasts will keep growing and they will continue to become tender. A yellow fluid (colostrum) may leak from your breasts. This is the first milk you are producing for your baby.  False labor is a condition in which you feel small, irregular tightenings of the muscles in the womb (contractions) that eventually go away. These are called Braxton Hicks contractions. Contractions may last for hours, days, or even weeks before true labor sets in.  Signs of labor can include: abdominal cramps; regular contractions that start at 10 minutes apart and become stronger and more frequent with time; watery or bloody mucus discharge that comes from the vagina; increased pelvic pressure and dull back pain; and leaking of amniotic fluid. This information is not intended to  replace advice given to you by your health care provider. Make sure you discuss any questions you have with your health care provider. Document Released: 11/02/2001 Document Revised: 04/15/2016 Document Reviewed: 01/09/2013 Elsevier Interactive Patient Education  2017 [employer].

## 2017-06-28 NOTE — Progress Notes (Signed)
PRENATAL VISIT NOTE  Subjective:  Carmen Prince is a 31 y.o. [phone] at 3282w6d being seen today for transfer of prenatal care; recently moved to area. Has been getting prenatal care, last visit was 4 week ago. Pregnancy remarkable for second trimester bleeding for which she was put on bed rest, no cervical issues. No bleeding since 25 weeks.   She is currently monitored for the following issues for this low-risk pregnancy and has Encounter for supervision of normal pregnancy in third trimester; Asthma; Second trimester bleeding; and History of back surgeries in 2009 and 2010 on her problem list.  Patient reports no complaints.  Contractions: Irregular. Vag. Bleeding: None.  Movement: Present. Denies leaking of fluid.   The following portions of the patient's history were reviewed and updated as appropriate: allergies, current medications, past family history, past medical history, past social history, past surgical history and problem list. Problem list updated.  Objective:   Vitals:   06/28/17 1053 06/28/17 1104  BP:  122/74  Pulse:  99  Weight:  226 lb (102.5 kg)  Height: 5\' 10"  (1.778 m)     Fetal Status: Fetal Heart Rate (bpm): 134   Movement: Present     General:  Alert, oriented and cooperative. Patient is in no acute distress.  Skin: Skin is warm and dry. No rash noted.   Cardiovascular: Normal heart rate noted  Respiratory: Normal respiratory effort, no problems with respiration noted  Abdomen: Soft, gravid, appropriate for gestational age.  Pain/Pressure: Present     Pelvic: Cervical exam deferred        Extremities: Normal range of motion.  Edema: Trace  Mental Status:  Normal mood and affect. Normal behavior. Normal judgment and thought content.   Assessment and Plan:  Pregnancy: [id number] at 4382w6d  1. Need for diphtheria-tetanus-pertussis (Tdap) vaccine Tdap given today - Tdap vaccine greater than or equal to 7yo IM  2. History of back surgeries in 2009 and  2010 Able to get epidural during pregnancies. Had X-rays in 2017 for PT, told her to call that PT office to get the images/reports sent here as Anesthesia team may want to evaluate them.  3. Second trimester bleeding 4. Encounter for fetal anatomic survey Bleeding precautions reviewed. Will obtain scan  - US MFM OB DETAIL +14 WK; Future  5. Supervision of other normal pregnancy, antepartum Previous chart reviewed and abstracted - OB RESULTS CONSOLE GC/Chlamydia - OB RESULTS CONSOLE RPR - OB RESULTS CONSOLE HIV antibody - OB RESULTS CONSOLE Rubella Antibody - OB RESULTS CONSOLE Hepatitis B surface antigen - OB RESULTS CONSOLE ABO/Rh - OB RESULTS CONSOLE Hemoglobin and hematocrit, blood - OB RESULTS CONSOLE PLATELET COUNT - Prenatal Multivit-Min-Fe-FA (PRENATAL/IRON) TABS; Take 1 tablet by mouth daily.  Dispense: 90 each; Refill: 3 - Prenatal Vit-Fe Fumarate-FA (PRENATAL PO); Take by mouth. - Cystic fibrosis diagnostic study - US OB Comp + 14 Wk - Cytology - PAP - AFP TETRA - Glucose tolerance, 1 hour - OB RESULTS CONSOLE Antibody Screen The nature of [place] [facility] with multiple MDs and other Advanced Practice Providers was explained to patient; also emphasized that residents, students are part of our team.  Preterm labor symptoms and general obstetric precautions including but not limited to vaginal bleeding, contractions, leaking of fluid and fetal movement were reviewed in detail with the patient. Please refer to After Visit Summary for other counseling recommendations.  Return in about 2 weeks (around 07/12/2017) for OB Visit.   Jaynie CollinsUgonna Hollis Oh,  MD

## 2017-07-08 ENCOUNTER — Ambulatory Visit (HOSPITAL_COMMUNITY): Payer: PRIVATE HEALTH INSURANCE | Attending: Obstetrics & Gynecology

## 2017-07-13 ENCOUNTER — Ambulatory Visit (INDEPENDENT_AMBULATORY_CARE_PROVIDER_SITE_OTHER): Payer: Medicaid Other | Admitting: Family Medicine

## 2017-07-13 VITALS — BP 99/64 | HR 103 | Wt 230.0 lb

## 2017-07-13 DIAGNOSIS — O4693 Antepartum hemorrhage, unspecified, third trimester: Secondary | ICD-10-CM

## 2017-07-13 DIAGNOSIS — Z9889 Other specified postprocedural states: Secondary | ICD-10-CM

## 2017-07-13 DIAGNOSIS — J4520 Mild intermittent asthma, uncomplicated: Secondary | ICD-10-CM

## 2017-07-13 DIAGNOSIS — Z3483 Encounter for supervision of other normal pregnancy, third trimester: Secondary | ICD-10-CM

## 2017-07-13 DIAGNOSIS — O4692 Antepartum hemorrhage, unspecified, second trimester: Secondary | ICD-10-CM

## 2017-07-13 NOTE — Progress Notes (Signed)
   PRENATAL VISIT NOTE  Subjective:  Carmen Prince is a 31 y.o. [phone] at [redacted]w[redacted]d being seen today for ongoing prenatal care.  She is currently monitored for the following issues for this low-risk pregnancy and has Encounter for supervision of normal pregnancy in third trimester; Asthma; Second trimester bleeding; and History of back surgeries in 2009 and 2010 on her problem list.  Patient reports no complaints.  Contractions: Irregular. Vag. Bleeding: None.  Movement: Present. Denies leaking of fluid.   The following portions of the patient's history were reviewed and updated as appropriate: allergies, current medications, past family history, past medical history, past social history, past surgical history and problem list. Problem list updated.  Objective:   Vitals:   07/13/17 1524  BP: 99/64  Pulse: (!) 103  Weight: 230 lb (104.3 kg)    Fetal Status: Fetal Heart Rate (bpm): 158   Movement: Present     General:  Alert, oriented and cooperative. Patient is in no acute distress.  Skin: Skin is warm and dry. No rash noted.   Cardiovascular: Normal heart rate noted  Respiratory: Normal respiratory effort, no problems with respiration noted  Abdomen: Soft, gravid, appropriate for gestational age.  Pain/Pressure: Present     Pelvic: Cervical exam deferred        Extremities: Normal range of motion.  Edema: Trace  Mental Status:  Normal mood and affect. Normal behavior. Normal judgment and thought content.   Assessment and Plan:  Pregnancy: [id number] at [redacted]w[redacted]d  1. Encounter for supervision of other normal pregnancy in third trimester UTD currently  2. Second trimester bleeding None currently  3. Mild intermittent asthma without complication No sx  4. History of back surgeries in 2009 and 2010   Preterm labor symptoms and general obstetric precautions including but not limited to vaginal bleeding, contractions, leaking of fluid and fetal movement were reviewed in detail with the  patient. Please refer to After Visit Summary for other counseling recommendations.  Return in about 2 weeks (around 07/27/2017) for Routine prenatal care.   Federico Flake, MD

## 2017-07-18 ENCOUNTER — Ambulatory Visit (HOSPITAL_COMMUNITY)
Admission: RE | Admit: 2017-07-18 | Discharge: 2017-07-18 | Disposition: A | Discharge status: Home / Self Care | Payer: PRIVATE HEALTH INSURANCE | Source: Ambulatory Visit | Attending: Obstetrics & Gynecology | Admitting: Obstetrics & Gynecology

## 2017-07-18 ENCOUNTER — Other Ambulatory Visit: Payer: Self-pay | Admitting: Obstetrics & Gynecology

## 2017-07-18 DIAGNOSIS — O4690 Antepartum hemorrhage, unspecified, unspecified trimester: Secondary | ICD-10-CM

## 2017-07-18 DIAGNOSIS — D573 Sickle-cell trait: Secondary | ICD-10-CM | POA: Insufficient documentation

## 2017-07-18 DIAGNOSIS — Z3A33 33 weeks gestation of pregnancy: Secondary | ICD-10-CM

## 2017-07-18 DIAGNOSIS — Z862 Personal history of diseases of the blood and blood-forming organs and certain disorders involving the immune mechanism: Secondary | ICD-10-CM

## 2017-07-18 DIAGNOSIS — O4693 Antepartum hemorrhage, unspecified, third trimester: Secondary | ICD-10-CM | POA: Insufficient documentation

## 2017-07-18 DIAGNOSIS — O4692 Antepartum hemorrhage, unspecified, second trimester: Secondary | ICD-10-CM

## 2017-07-18 DIAGNOSIS — O99013 Anemia complicating pregnancy, third trimester: Secondary | ICD-10-CM | POA: Diagnosis not present

## 2017-07-18 DIAGNOSIS — Z3689 Encounter for other specified antenatal screening: Secondary | ICD-10-CM

## 2017-07-27 ENCOUNTER — Ambulatory Visit (INDEPENDENT_AMBULATORY_CARE_PROVIDER_SITE_OTHER): Payer: Medicaid Other | Admitting: Family Medicine

## 2017-07-27 VITALS — BP 117/77 | HR 96 | Wt 226.0 lb

## 2017-07-27 DIAGNOSIS — Z3483 Encounter for supervision of other normal pregnancy, third trimester: Secondary | ICD-10-CM

## 2017-07-27 DIAGNOSIS — Z9889 Other specified postprocedural states: Secondary | ICD-10-CM

## 2017-07-27 DIAGNOSIS — O4692 Antepartum hemorrhage, unspecified, second trimester: Secondary | ICD-10-CM

## 2017-07-27 MED ORDER — CONCEPT OB 130-92.4-1 MG PO CAPS: 1.0000 | ORAL_CAPSULE | Freq: Every day | ORAL | 11 refills | Status: AC

## 2017-07-27 NOTE — Progress Notes (Signed)
   PRENATAL VISIT NOTE  Subjective:  Carmen Prince is a 31 y.o. [id number] at 6827w0d being seen today for ongoing prenatal care.  She is currently monitored for the following issues for this low-risk pregnancy and has Encounter for supervision of normal pregnancy in third trimester; Asthma; Second trimester bleeding; and History of back surgeries in 2009 and 2010 on her problem list.  Patient reports no complaints.  Contractions: Irregular. Vag. Bleeding: None.  Movement: Present. Denies leaking of fluid.   The following portions of the patient's history were reviewed and updated as appropriate: allergies, current medications, past family history, past medical history, past social history, past surgical history and problem list. Problem list updated.  Objective:   Vitals:   07/27/17 1347  BP: 117/77  Pulse: 96  Weight: 226 lb (102.5 kg)    Fetal Status: Fetal Heart Rate (bpm): 140   Movement: Present     General:  Alert, oriented and cooperative. Patient is in no acute distress.  Skin: Skin is warm and dry. No rash noted.   Cardiovascular: Normal heart rate noted  Respiratory: Normal respiratory effort, no problems with respiration noted  Abdomen: Soft, gravid, appropriate for gestational age.  Pain/Pressure: Present     Pelvic: Cervical exam deferred        Extremities: Normal range of motion.  Edema: Trace  Mental Status:  Normal mood and affect. Normal behavior. Normal judgment and thought content.   Assessment and Plan:  Pregnancy: [id number] at 5327w0d  1. Encounter for supervision of other normal pregnancy in third trimester - Signed BTS today - Given information about nexplanon  - Given prenatal vitamin - Prenat w/o A Vit-FeFum-FePo-FA (CONCEPT OB) 130-92.4-1 MG CAPS; Take 1 capsule by mouth daily.  Dispense: 30 capsule; Refill: 11  2. History of back surgeries in 2009 and 2010 May effect epdirual/spinal placement Desires BTS if able to have regional anesthesia  3. Second  trimester bleeding None currently  Preterm labor symptoms and general obstetric precautions including but not limited to vaginal bleeding, contractions, leaking of fluid and fetal movement were reviewed in detail with the patient. Please refer to After Visit Summary for other counseling recommendations.  Return in about 1 week (around 08/03/2017) for Routine prenatal care.   Federico FlakeKimberly Niles Karishma Unrein, MD

## 2017-07-28 ENCOUNTER — Encounter: Payer: Self-pay | Admitting: *Deleted

## 2017-08-03 ENCOUNTER — Other Ambulatory Visit (HOSPITAL_COMMUNITY)
Admission: RE | Admit: 2017-08-03 | Discharge: 2017-08-03 | Disposition: A | Discharge status: Home / Self Care | Payer: Medicaid Other | Source: Ambulatory Visit | Attending: Student | Admitting: Student

## 2017-08-03 ENCOUNTER — Ambulatory Visit (INDEPENDENT_AMBULATORY_CARE_PROVIDER_SITE_OTHER): Payer: Medicaid Other | Admitting: Student

## 2017-08-03 VITALS — BP 119/78 | HR 102 | Wt 227.4 lb

## 2017-08-03 DIAGNOSIS — Z3483 Encounter for supervision of other normal pregnancy, third trimester: Secondary | ICD-10-CM

## 2017-08-03 NOTE — Progress Notes (Signed)
   PRENATAL VISIT NOTE  Subjective:  Carmen Prince is a 31 y.o. [phone] at 5569w0d being seen today for ongoing prenatal care.  She is currently monitored for the following issues for this low-risk pregnancy and has Encounter for supervision of normal pregnancy in third trimester; Asthma; Second trimester bleeding; and History of back surgeries in 2009 and 2010 on her problem list.  Patient reports no complaints.  Contractions: Irregular. Vag. Bleeding: None.  Movement: Present. Denies leaking of fluid.   The following portions of the patient's history were reviewed and updated as appropriate: allergies, current medications, past family history, past medical history, past social history, past surgical history and problem list. Problem list updated.  Objective:   Vitals:   08/03/17 1343  BP: 119/78  Pulse: (!) 102  Weight: 227 lb 6.4 oz (103.1 kg)    Fetal Status: Fetal Heart Rate (bpm): 135 Fundal Height: 37 cm Movement: Present  Presentation: Vertex  General:  Alert, oriented and cooperative. Patient is in no acute distress.  Skin: Skin is warm and dry. No rash noted.   Cardiovascular: Normal heart rate noted  Respiratory: Normal respiratory effort, no problems with respiration noted  Abdomen: Soft, gravid, appropriate for gestational age.  Pain/Pressure: Present     Pelvic: Cervical exam performed Dilation: 1      Extremities: Normal range of motion.  Edema: Trace  Mental Status:  Normal mood and affect. Normal behavior. Normal judgment and thought content.   Assessment and Plan:  Pregnancy: [id number] at 4769w0d  1. Encounter for supervision of other normal pregnancy in third trimester Patient doing well; no complaints.  - Strep Gp B NAA - Cervicovaginal ancillary only  Term labor symptoms and general obstetric precautions including but not limited to vaginal bleeding, contractions, leaking of fluid and fetal movement were reviewed in detail with the patient. Please refer to After  Visit Summary for other counseling recommendations.  Return in 1 week (on 08/10/2017) for ROB.   Marylene LandKathryn Lorraine Reigna Ruperto, CNM

## 2017-08-03 NOTE — Patient Instructions (Signed)

## 2017-08-04 LAB — CERVICOVAGINAL ANCILLARY ONLY
CHLAMYDIA, DNA PROBE: NEGATIVE
Neisseria Gonorrhea: NEGATIVE

## 2017-08-05 LAB — STREP GP B NAA: Strep Gp B NAA: NEGATIVE

## 2017-08-10 ENCOUNTER — Ambulatory Visit (INDEPENDENT_AMBULATORY_CARE_PROVIDER_SITE_OTHER): Payer: Medicaid Other | Admitting: Obstetrics & Gynecology

## 2017-08-10 VITALS — BP 114/75 | HR 103 | Wt 228.0 lb

## 2017-08-10 DIAGNOSIS — Z3483 Encounter for supervision of other normal pregnancy, third trimester: Secondary | ICD-10-CM

## 2017-08-10 NOTE — Progress Notes (Signed)
   PRENATAL VISIT NOTE  Subjective:  Carmen Prince is a 31 y.o. [phone] at [redacted]w[redacted]d being seen today for ongoing prenatal care.  She is currently monitored for the following issues for this low-risk pregnancy and has Encounter for supervision of normal pregnancy in third trimester; Asthma; Second trimester bleeding; and History of back surgeries in 2009 and 2010 on her problem list.  Patient reports no complaints.  Contractions: Irregular. Vag. Bleeding: None.  Movement: Present. Denies leaking of fluid.   The following portions of the patient's history were reviewed and updated as appropriate: allergies, current medications, past family history, past medical history, past social history, past surgical history and problem list. Problem list updated.  Objective:   Vitals:   08/10/17 1142  BP: 114/75  Pulse: (!) 103  Weight: 228 lb (103.4 kg)    Fetal Status:     Movement: Present     General:  Alert, oriented and cooperative. Patient is in no acute distress.  Skin: Skin is warm and dry. No rash noted.   Cardiovascular: Normal heart rate noted  Respiratory: Normal respiratory effort, no problems with respiration noted  Abdomen: Soft, gravid, appropriate for gestational age.  Pain/Pressure: Present     Pelvic: Cervical exam performed        Extremities: Normal range of motion.     Mental Status:  Normal mood and affect. Normal behavior. Normal judgment and thought content.   Assessment and Plan:  Pregnancy: [id number] at [redacted]w[redacted]d  1. Encounter for supervision of other normal pregnancy in third trimester   Term labor symptoms and general obstetric precautions including but not limited to vaginal bleeding, contractions, leaking of fluid and fetal movement were reviewed in detail with the patient. Please refer to After Visit Summary for other counseling recommendations.  No Follow-up on file.   Allie Bossier, MD

## 2017-08-13 ENCOUNTER — Observation Stay
Admission: EM | Admit: 2017-08-13 | Discharge: 2017-08-13 | Disposition: A | Discharge status: Home / Self Care | Payer: Medicaid Other | Attending: Obstetrics and Gynecology | Admitting: Obstetrics and Gynecology

## 2017-08-13 DIAGNOSIS — O99513 Diseases of the respiratory system complicating pregnancy, third trimester: Principal | ICD-10-CM | POA: Insufficient documentation

## 2017-08-13 DIAGNOSIS — Z3490 Encounter for supervision of normal pregnancy, unspecified, unspecified trimester: Secondary | ICD-10-CM

## 2017-08-13 DIAGNOSIS — O99333 Smoking (tobacco) complicating pregnancy, third trimester: Secondary | ICD-10-CM | POA: Insufficient documentation

## 2017-08-13 DIAGNOSIS — Z3A37 37 weeks gestation of pregnancy: Secondary | ICD-10-CM | POA: Insufficient documentation

## 2017-08-13 DIAGNOSIS — O99343 Other mental disorders complicating pregnancy, third trimester: Secondary | ICD-10-CM | POA: Insufficient documentation

## 2017-08-13 NOTE — Progress Notes (Signed)
Pt prepared for discharged. Reviewed signs to call or return to [facility] and discharge instructions. Reviewed provider information. All questions answered. Pt ambulated to visitor's entrance with brother.

## 2017-08-13 NOTE — OB Triage Note (Signed)
Pt presents to hospital because at 2330 pt reports feeling sharp pain in chest, pt thought it was from asthma and used her inhaler, pain is improved but still present currently but then states contractions started q 4-7 min. Pt report a few quarter size blood drops in mer underwear but denies leaking fluid and reports good fetal movement.

## 2017-08-13 NOTE — Discharge Summary (Signed)
Obstetric Discharge Summary   Patient ID: Carmen Prince MRN: [id number] DOB/AGE: Jul 23, 1986 31 y.o.   Date of Admission: 08/13/2017  Date of Discharge: 08/13/2017  Admitting Diagnosis: IUP at [redacted]w[redacted]d  Secondary Diagnosis: Asthma   Mode of Delivery: N/A Discharge Diagnosis: Term pregnancy with chest tightness related to asthma which relieved after inhaler   N/A   Post partum procedures: N/A  Complications: N/A   Brief Hospital Course  Carmen Prince is a [id number] who had some chest tightness and used inhaler and got better and then realized she was contracting. [facility] came into [facility] so that she could be evaluated. Waked to see if labor would progress but, after a period of time, she did not make any cervical progress and was discharged.   Labs: CBC 05/17/2017  Hemoglobin 10.1  Hematocrit 31  Platelets 329    Physical exam:  Blood pressure 112/66, pulse 86, temperature 97.9 F (36.6 C), temperature source Oral, resp. rate 20, last menstrual period 11/24/2016. General: alert and no distress Abdomen: Gravid Cx:no progress Extremities: No evidence of DVT seen on physical exam. No lower extremity edema.  Discharge Instructions:Labor instructions Activity: Up Ad lib Diet: Regular Medications: Allergies as of 08/13/2017      Reactions   Other    Cat-hives, wheezing   Lortab [hydrocodone-acetaminophen] Hives      Medication List    ASK your doctor about these medications   CONCEPT OB 130-92.4-1 MG Caps Take 1 capsule by mouth daily.   hydrOXYzine 25 MG tablet Commonly known as:  ATARAX/VISTARIL Take 25 mg by mouth 3 (three) times daily.      Outpatient follow up: weekly  Postpartum contraception: will decide but, planning BTL  Discharged Condition: Stable   Discharged to: Home   Newborn Data:   Disposition: Not delivered   Apgars: APGAR (1 MIN): This patient has no babies on file.  APGAR (5 MINS): This patient has no babies on file.  APGAR (10  MINS): This patient has no babies on file.    Sharee Pimple, CNM 08/13/2017

## 2017-08-13 NOTE — Discharge Instructions (Signed)
Please call or return to [facility] with any of the following: Leaking of fluid Vaginal bleeding Decreased fetal movement Contractions every 3-5 mins over an hour

## 2017-08-13 NOTE — H&P (Signed)
Carmen Prince is a 31 y.o. female [phone] with LMP of 13/18  And EDD of 08/31/17 presenting for chest tightness rel to asthma which has cleared but, she started to have UC's that were becoming more uncomfortable and came here. Pt goes to [facility] and transferred from [facility] in [place], [place]. Pt had 2nd trimester bleeding x 2 episodes and no hx of previa. Pt has also had 2 back surgeries  In 2009 & 2010 and is a candidate for epidural per Dr Macon Large.  OB History    Gravida Para Term Preterm AB Living   0 2 2   SAB TAB Ectopic Multiple Live Births   0 2 0 0 2     Past Medical History:  Diagnosis Date  . Abnormal Pap smear of cervix   . Asthma   . Depression    Past Surgical History:  Procedure Laterality Date  . BACK SURGERY  2009  . BACK SURGERY  2011   Family History: family history includes Lung cancer in her maternal grandfather; Prostate cancer in her father. Social History:  reports that she has been smoking.  She has never used smokeless tobacco. She reports that she does not drink alcohol or use drugs.     Maternal Diabetes: 122 Genetic Screening:AFP neg, CF neg   Maternal Ultrasounds/Referrals Fetal Ultrasounds or other Referrals:  None Maternal Substance Abuse:  No Significant Maternal Medications:  None Significant Maternal Lab Results:  None Other Comments:  None  ROS  History Dilation: 2 Effacement (%): 70 Station: -2 Exam by:: K Veal, RN Blood pressure 125/77, pulse 86, temperature 98.4 F (36.9 C), temperature source Oral, resp. rate 18, last menstrual period 11/24/2016. Exam Physical Exam  Prenatal labs: ABO, Rh: B/Positive/-- (06/26 0000) Antibody: Negative (06/26 0000) Rubella: Immune (06/26 0000) RPR: Nonreactive (06/26 0000)  HBsAg: Negative (06/26 0000)  HIV: Non-reactive (06/26 0000)  GBS: Negative (09/12 0000)   Assessment/Plan: A:1. IUP at 37 weeks 2. Unassigned pt from [facility]  3. GBS Neg 4.  Asthma 5. Transfer of PNC at 30 weeks to [place] P: Pt is walking to stimulate labor contractions as she feels this is labor. 2. Watch for wheezing and asthma attack. Pt used inhaler at home and feels better.    Sharee Pimple 08/13/2017, 2:48 AM

## 2017-08-17 ENCOUNTER — Encounter (HOSPITAL_COMMUNITY): Payer: Self-pay

## 2017-08-17 ENCOUNTER — Inpatient Hospital Stay (HOSPITAL_COMMUNITY)
Admission: AD | Admit: 2017-08-17 | Discharge: 2017-08-17 | Disposition: A | Discharge status: Home / Self Care | Payer: Medicaid Other | Source: Ambulatory Visit | Attending: Obstetrics & Gynecology | Admitting: Obstetrics & Gynecology

## 2017-08-17 ENCOUNTER — Ambulatory Visit (INDEPENDENT_AMBULATORY_CARE_PROVIDER_SITE_OTHER): Payer: Medicaid Other | Admitting: Obstetrics and Gynecology

## 2017-08-17 VITALS — BP 118/80 | HR 99 | Wt 228.0 lb

## 2017-08-17 DIAGNOSIS — Z3A38 38 weeks gestation of pregnancy: Secondary | ICD-10-CM | POA: Insufficient documentation

## 2017-08-17 DIAGNOSIS — O4692 Antepartum hemorrhage, unspecified, second trimester: Secondary | ICD-10-CM

## 2017-08-17 DIAGNOSIS — Z3483 Encounter for supervision of other normal pregnancy, third trimester: Secondary | ICD-10-CM

## 2017-08-17 DIAGNOSIS — O479 False labor, unspecified: Secondary | ICD-10-CM

## 2017-08-17 NOTE — MAU Note (Signed)
Pt had a dr appt today and had membranes stripped. MD stated pt was 2 cm dilated. Pt stated she has had contractions go from every 4 minutes to every 2-3 minutes. Concerned about vaginal bleeding after exam earlier in day. Pt had dripping, bright red blood. Pt stated blood bled through underwear but not heavily bleeding.

## 2017-08-17 NOTE — MAU Note (Signed)
I have communicated with Dr Doroteo Glassman and reviewed vital signs:  Vitals:   08/17/17 2209 08/17/17 2242  BP:  136/76  Pulse:  80  Resp:  16  Temp:  97.8 F (36.6 C)  SpO2: 99% 100%    Vaginal exam:  Dilation: 1.5 Effacement (%): Thick Cervical Position: Posterior Station: -3 Presentation: Undeterminable Exam by:: Vicke Plotner S, RN,   Also reviewed contraction pattern and that non-stress test is reactive.  It has been documented that patient is having an occasional contractions with uterine irritability with no cervical change since her office visit this afternoon, not indicating active labor.  Patient denies any other complaints.  Based on this report provider has given order for discharge.  A discharge order and diagnosis entered by a provider.   Labor discharge instructions reviewed with patient.

## 2017-08-17 NOTE — Progress Notes (Signed)
   PRENATAL VISIT NOTE  Subjective:  Carmen Prince is a 31 y.o. [id number] at [redacted]w[redacted]d being seen today for ongoing prenatal care.  She is currently monitored for the following issues for this low-risk pregnancy and has Encounter for supervision of normal pregnancy in third trimester; Asthma; Second trimester bleeding; History of back surgeries in 2009 and 2010; Labor and delivery, indication for care; and Pregnancy on her problem list.  Patient reports no complaints.  Contractions: Irregular. Vag. Bleeding: None.  Movement: Present. Denies leaking of fluid.   The following portions of the patient's history were reviewed and updated as appropriate: allergies, current medications, past family history, past medical history, past social history, past surgical history and problem list. Problem list updated.  Objective:   Vitals:   08/17/17 1639  BP: 118/80  Pulse: 99  Weight: 228 lb (103.4 kg)    Fetal Status: Fetal Heart Rate (bpm): 130 Fundal Height: 38 cm Movement: Present  Presentation: Vertex  General:  Alert, oriented and cooperative. Patient is in no acute distress.  Skin: Skin is warm and dry. No rash noted.   Cardiovascular: Normal heart rate noted  Respiratory: Normal respiratory effort, no problems with respiration noted  Abdomen: Soft, gravid, appropriate for gestational age.  Pain/Pressure: Present     Pelvic: Cervical exam performed Dilation: 2 Effacement (%): 50 Station: -3  Extremities: Normal range of motion.  Edema: Trace  Mental Status:  Normal mood and affect. Normal behavior. Normal judgment and thought content.   Assessment and Plan:  Pregnancy: [id number] at [redacted]w[redacted]d  1. Encounter for supervision of other normal pregnancy in third trimester Patient is doing well without complaints   Term labor symptoms and general obstetric precautions including but not limited to vaginal bleeding, contractions, leaking of fluid and fetal movement were reviewed in detail with the  patient. Please refer to After Visit Summary for other counseling recommendations.  No Follow-up on file.   Catalina Antigua, MD

## 2017-08-17 NOTE — Discharge Instructions (Signed)
Third Trimester of Pregnancy °The third trimester is from week 29 through week 42, months 7 through 9. This trimester is when your unborn baby (fetus) is growing very fast. At the end of the ninth month, the unborn baby is about 20 inches in length. It weighs about 6-10 pounds. °Follow these instructions at home: °· Avoid all smoking, herbs, and alcohol. Avoid drugs not approved by your doctor. °· Do not use any tobacco products, including cigarettes, chewing tobacco, and electronic cigarettes. If you need help quitting, ask your doctor. You may get counseling or other support to help you quit. °· Only take medicine as told by your doctor. Some medicines are safe and some are not during pregnancy. °· Exercise only as told by your doctor. Stop exercising if you start having cramps. °· Eat regular, healthy meals. °· Wear a good support bra if your breasts are tender. °· Do not use hot tubs, steam rooms, or saunas. °· Wear your seat belt when driving. °· Avoid raw meat, uncooked cheese, and liter boxes and soil used by cats. °· Take your prenatal vitamins. °· Take 1500-2000 milligrams of calcium daily starting at the 20th week of pregnancy until you deliver your baby. °· Try taking medicine that helps you poop (stool softener) as needed, and if your doctor approves. Eat more fiber by eating fresh fruit, vegetables, and whole grains. Drink enough fluids to keep your pee (urine) clear or pale yellow. °· Take warm water baths (sitz baths) to soothe pain or discomfort caused by hemorrhoids. Use hemorrhoid cream if your doctor approves. °· If you have puffy, bulging veins (varicose veins), wear support hose. Raise (elevate) your feet for 15 minutes, 3-4 times a day. Limit salt in your diet. °· Avoid heavy lifting, wear low heels, and sit up straight. °· Rest with your legs raised if you have leg cramps or low back pain. °· Visit your dentist if you have not gone during your pregnancy. Use a soft toothbrush to brush your  teeth. Be gentle when you floss. °· You can have sex (intercourse) unless your doctor tells you not to. °· Do not travel far distances unless you must. Only do so with your doctor's approval. °· Take prenatal classes. °· Practice driving to the hospital. °· Pack your hospital bag. °· Prepare the baby's room. °· Go to your doctor visits. °Get help if: °· You are not sure if you are in labor or if your water has broken. °· You are dizzy. °· You have mild cramps or pressure in your lower belly (abdominal). °· You have a nagging pain in your belly area. °· You continue to feel sick to your stomach (nauseous), throw up (vomit), or have watery poop (diarrhea). °· You have bad smelling fluid coming from your vagina. °· You have pain with peeing (urination). °Get help right away if: °· You have a fever. °· You are leaking fluid from your vagina. °· You are spotting or bleeding from your vagina. °· You have severe belly cramping or pain. °· You lose or gain weight rapidly. °· You have trouble catching your breath and have chest pain. °· You notice sudden or extreme puffiness (swelling) of your face, hands, ankles, feet, or legs. °· You have not felt the baby move in over an hour. °· You have severe headaches that do not go away with medicine. °· You have vision changes. °This information is not intended to replace advice given to you by your health care provider. Make   sure you discuss any questions you have with your health care provider. Document Released: 02/02/2010 Document Revised: 04/15/2016 Document Reviewed: 01/09/2013 Elsevier Interactive Patient Education  2017 [employer] of the uterus can occur throughout pregnancy, but they are not always a sign that you are in labor. You may have practice contractions called Braxton Hicks contractions. These false labor contractions are sometimes confused with true labor. What are Deberah Pelton contractions? Braxton Hicks  contractions are tightening movements that occur in the muscles of the uterus before labor. Unlike true labor contractions, these contractions do not result in opening (dilation) and thinning of the cervix. Toward the end of pregnancy (32-34 weeks), Braxton Hicks contractions can happen more often and may become stronger. These contractions are sometimes difficult to tell apart from true labor because they can be very uncomfortable. You should not feel embarrassed if you go to the hospital with false labor. Sometimes, the only way to tell if you are in true labor is for your health care provider to look for changes in the cervix. The health care provider will do a physical exam and may monitor your contractions. If you are not in true labor, the exam should show that your cervix is not dilating and your water has not broken. If there are no prenatal problems or other health problems associated with your pregnancy, it is completely safe for you to be sent home with false labor. You may continue to have Braxton Hicks contractions until you go into true labor. How can I tell the difference between true labor and false labor?  Differences ? False labor ? Contractions last 30-70 seconds.: Contractions are usually shorter and not as strong as true labor contractions. ? Contractions become very regular.: Contractions are usually irregular. ? Discomfort is usually felt in the top of the uterus, and it spreads to the lower abdomen and low back.: Contractions are often felt in the front of the lower abdomen and in the groin. ? Contractions do not go away with walking.: Contractions may go away when you walk around or change positions while lying down. ? Contractions usually become more intense and increase in frequency.: Contractions get weaker and are shorter-lasting as time goes on. ? The cervix dilates and gets thinner.: The cervix usually does not dilate or become thin. Follow these instructions at  home:  Take over-the-counter and prescription medicines only as told by your health care provider.  Keep up with your usual exercises and follow other instructions from your health care provider.  Eat and drink lightly if you think you are going into labor.  If Braxton Hicks contractions are making you uncomfortable: ? Change your position from lying down or resting to walking, or change from walking to resting. ? Sit and rest in a tub of warm water. ? Drink enough fluid to keep your urine clear or pale yellow. Dehydration may cause these contractions. ? Do slow and deep breathing several times an hour.  Keep all follow-up prenatal visits as told by your health care provider. This is important. Contact a health care provider if:  You have a fever.  You have continuous pain in your abdomen. Get help right away if:  Your contractions become stronger, more regular, and closer together.  You have fluid leaking or gushing from your vagina.  You pass blood-tinged mucus (bloody show).  You have bleeding from your vagina.  You have low back pain that you never had before.  You feel your  your baby's head pushing down and causing pelvic pressure.  Your baby is not moving inside you as much as it used to. Summary  Contractions that occur before labor are called Braxton Hicks contractions, false labor, or practice contractions.  Braxton Hicks contractions are usually shorter, weaker, farther apart, and less regular than true labor contractions. True labor contractions usually become progressively stronger and regular and they become more frequent.  Manage discomfort from Braxton Hicks contractions by changing position, resting in a warm bath, drinking plenty of water, or practicing deep breathing. This information is not intended to replace advice given to you by your health care provider. Make sure you discuss any questions you have with your health care provider. Document Released:  11/08/2005 Document Revised: 09/27/2016 Document Reviewed: 09/27/2016 Elsevier Interactive Patient Education  2017 Elsevier Inc.  

## 2017-08-24 ENCOUNTER — Ambulatory Visit (INDEPENDENT_AMBULATORY_CARE_PROVIDER_SITE_OTHER): Payer: Medicaid Other | Admitting: Family Medicine

## 2017-08-24 VITALS — BP 118/83 | HR 69 | Wt 229.0 lb

## 2017-08-24 DIAGNOSIS — Z9889 Other specified postprocedural states: Secondary | ICD-10-CM

## 2017-08-24 DIAGNOSIS — Z3483 Encounter for supervision of other normal pregnancy, third trimester: Secondary | ICD-10-CM

## 2017-08-24 NOTE — Progress Notes (Signed)
   PRENATAL VISIT NOTE  Subjective:  Carmen Prince is a 31 y.o. [id number] at [redacted]w[redacted]d being seen today for ongoing prenatal care.  She is currently monitored for the following issues for this low-risk pregnancy and has Encounter for supervision of normal pregnancy in third trimester; Asthma; Second trimester bleeding; and History of back surgeries in 2009 and 2010 on her problem list.  Patient reports no complaints.  Contractions: Irregular. Vag. Bleeding: None.  Movement: Present. Denies leaking of fluid.   The following portions of the patient's history were reviewed and updated as appropriate: allergies, current medications, past family history, past medical history, past social history, past surgical history and problem list. Problem list updated.  Objective:   Vitals:   08/24/17 0957  BP: 118/83  Pulse: 69  Weight: 229 lb (103.9 kg)    Fetal Status: Fetal Heart Rate (bpm): 134 Fundal Height: 39 cm Movement: Present  Presentation: Vertex  General:  Alert, oriented and cooperative. Patient is in no acute distress.  Skin: Skin is warm and dry. No rash noted.   Cardiovascular: Normal heart rate noted  Respiratory: Normal respiratory effort, no problems with respiration noted  Abdomen: Soft, gravid, appropriate for gestational age.  Pain/Pressure: Present     Pelvic: Cervical exam performed Dilation: 2 Effacement (%): 50 Station: -3  Extremities: Normal range of motion.  Edema: Trace  Mental Status:  Normal mood and affect. Normal behavior. Normal judgment and thought content.   Assessment and Plan:  Pregnancy: [id number] at [redacted]w[redacted]d  1. Encounter for supervision of other normal pregnancy in third trimester UTD Reviewed sweeping of membranes risks and benefits. Provided today. Reviewed PD-IOL at 41 week, will request today  2. History of back surgeries in 2009 and 2010  Term labor symptoms and general obstetric precautions including but not limited to vaginal bleeding, contractions,  leaking of fluid and fetal movement were reviewed in detail with the patient. Please refer to After Visit Summary for other counseling recommendations.  Return in about 1 week (around 08/31/2017) for Routine prenatal care.   Federico Flake, MD

## 2017-08-24 NOTE — Progress Notes (Signed)
Patient would like to be check today  

## 2017-08-30 ENCOUNTER — Telehealth (HOSPITAL_COMMUNITY): Payer: Self-pay | Admitting: *Deleted

## 2017-08-30 ENCOUNTER — Ambulatory Visit (INDEPENDENT_AMBULATORY_CARE_PROVIDER_SITE_OTHER): Payer: Medicaid Other | Admitting: Obstetrics & Gynecology

## 2017-08-30 ENCOUNTER — Encounter (HOSPITAL_COMMUNITY): Payer: Self-pay | Admitting: *Deleted

## 2017-08-30 VITALS — BP 122/83 | HR 111 | Wt 227.4 lb

## 2017-08-30 DIAGNOSIS — Z3483 Encounter for supervision of other normal pregnancy, third trimester: Secondary | ICD-10-CM | POA: Diagnosis not present

## 2017-08-30 NOTE — Progress Notes (Signed)
   PRENATAL VISIT NOTE  Subjective:  Carmen Prince is a 31 y.o. [phone] at [redacted]w[redacted]d being seen today for ongoing prenatal care.  She is currently monitored for the following issues for this low-risk pregnancy and has Encounter for supervision of normal pregnancy in third trimester; Asthma; Second trimester bleeding; and History of back surgeries in 2009 and 2010 on her problem list.  Patient reports occasional contractions.  Contractions: Irregular. Vag. Bleeding: None.  Movement: Present. Denies leaking of fluid.   The following portions of the patient's history were reviewed and updated as appropriate: allergies, current medications, past family history, past medical history, past social history, past surgical history and problem list. Problem list updated.  Objective:   Vitals:   08/30/17 1027  BP: 122/83  Pulse: (!) 111  Weight: 227 lb 6.4 oz (103.1 kg)    Fetal Status: Fetal Heart Rate (bpm): 143 Fundal Height: 40 cm Movement: Present  Presentation: Vertex  General:  Alert, oriented and cooperative. Patient is in no acute distress.  Skin: Skin is warm and dry. No rash noted.   Cardiovascular: Normal heart rate noted  Respiratory: Normal respiratory effort, no problems with respiration noted  Abdomen: Soft, gravid, appropriate for gestational age.  Pain/Pressure: Present     Pelvic: Cervical exam performed Dilation: 3 Effacement (%): 70 Station: -3  Extremities: Normal range of motion.  Edema: Trace  Mental Status:  Normal mood and affect. Normal behavior. Normal judgment and thought content.   Assessment and Plan:  Pregnancy: [id number] at 106w6d  1. Encounter for supervision of other normal pregnancy in third trimester Favorable cervix, patient reassured Postdates testing to start later this week. Term labor symptoms and general obstetric precautions including but not limited to vaginal bleeding, contractions, leaking of fluid and fetal movement were reviewed in detail with the  patient. Please refer to After Visit Summary for other counseling recommendations.  Return for NST, AFI on 10/11 and OB visit, NST on 10/15.  Marland Kitchen   Jaynie Collins, MD

## 2017-08-30 NOTE — Telephone Encounter (Signed)
Preadmission screen  

## 2017-08-30 NOTE — Patient Instructions (Signed)
Return to clinic for any scheduled appointments or obstetric concerns, or go to MAU for evaluation  

## 2017-08-31 ENCOUNTER — Inpatient Hospital Stay
Admission: EM | Admit: 2017-08-31 | Discharge: 2017-09-02 | DRG: 806 | Disposition: A | Discharge status: Home / Self Care | Payer: Medicaid Other | Attending: Obstetrics and Gynecology | Admitting: Obstetrics and Gynecology

## 2017-08-31 DIAGNOSIS — O43123 Velamentous insertion of umbilical cord, third trimester: Secondary | ICD-10-CM | POA: Diagnosis present

## 2017-08-31 DIAGNOSIS — O9081 Anemia of the puerperium: Secondary | ICD-10-CM | POA: Diagnosis not present

## 2017-08-31 DIAGNOSIS — Z87891 Personal history of nicotine dependence: Secondary | ICD-10-CM

## 2017-08-31 DIAGNOSIS — O26893 Other specified pregnancy related conditions, third trimester: Secondary | ICD-10-CM | POA: Diagnosis present

## 2017-08-31 DIAGNOSIS — D62 Acute posthemorrhagic anemia: Secondary | ICD-10-CM | POA: Diagnosis not present

## 2017-08-31 DIAGNOSIS — Z3483 Encounter for supervision of other normal pregnancy, third trimester: Secondary | ICD-10-CM

## 2017-08-31 DIAGNOSIS — O4692 Antepartum hemorrhage, unspecified, second trimester: Secondary | ICD-10-CM

## 2017-08-31 DIAGNOSIS — Z3A40 40 weeks gestation of pregnancy: Secondary | ICD-10-CM | POA: Diagnosis not present

## 2017-08-31 LAB — CBC
HCT: 30.4 % — ABNORMAL LOW (ref 35.0–47.0)
Hemoglobin: 10.1 g/dL — ABNORMAL LOW (ref 12.0–16.0)
MCH: 24.9 pg — ABNORMAL LOW (ref 26.0–34.0)
MCHC: 33.1 g/dL (ref 32.0–36.0)
MCV: 75.3 fL — ABNORMAL LOW (ref 80.0–100.0)
PLATELETS: 280 10*3/uL (ref 150–440)
RBC: 4.04 MIL/uL (ref 3.80–5.20)
RDW: 16.4 % — AB (ref 11.5–14.5)
WBC: 14.1 10*3/uL — AB (ref 3.6–11.0)

## 2017-08-31 LAB — TYPE AND SCREEN
ABO/RH(D): B POS
ANTIBODY SCREEN: NEGATIVE

## 2017-08-31 MED ORDER — TETANUS-DIPHTH-ACELL PERTUSSIS 5-2.5-18.5 LF-MCG/0.5 IM SUSP: 0.5000 mL | Freq: Once | INTRAMUSCULAR | Status: DC

## 2017-08-31 MED ORDER — IBUPROFEN 400 MG PO TABS
600.0000 mg | ORAL_TABLET | Freq: Four times a day (QID) | ORAL | Status: DC
  Administered 2017-08-31 – 2017-09-02 (×7): 600 mg via ORAL
  Filled 2017-08-31 (×8): qty 1

## 2017-08-31 MED ORDER — FLEET ENEMA 7-19 GM/118ML RE ENEM: 1.0000 | ENEMA | Freq: Every day | RECTAL | Status: DC | PRN

## 2017-08-31 MED ORDER — METHYLERGONOVINE MALEATE 0.2 MG PO TABS
0.2000 mg | ORAL_TABLET | Freq: Three times a day (TID) | ORAL | Status: DC
Start: 2017-08-31 — End: 2017-09-01
  Administered 2017-08-31 (×3): 0.2 mg via ORAL
  Filled 2017-08-31 (×3): qty 1

## 2017-08-31 MED ORDER — BISACODYL 10 MG RE SUPP: 10.0000 mg | Freq: Every day | RECTAL | Status: DC | PRN

## 2017-08-31 MED ORDER — ONDANSETRON HCL 4 MG PO TABS: 4.0000 mg | ORAL_TABLET | ORAL | Status: DC | PRN

## 2017-08-31 MED ORDER — DIBUCAINE 1 % RE OINT: 1.0000 "application " | TOPICAL_OINTMENT | RECTAL | Status: DC | PRN

## 2017-08-31 MED ORDER — BENZOCAINE-MENTHOL 20-0.5 % EX AERO: 1.0000 "application " | INHALATION_SPRAY | CUTANEOUS | Status: DC | PRN

## 2017-08-31 MED ORDER — DIPHENHYDRAMINE HCL 25 MG PO CAPS: 25.0000 mg | ORAL_CAPSULE | Freq: Four times a day (QID) | ORAL | Status: DC | PRN

## 2017-08-31 MED ORDER — MISOPROSTOL 200 MCG PO TABS
ORAL_TABLET | ORAL | Status: AC
  Filled 2017-08-31: qty 4

## 2017-08-31 MED ORDER — SIMETHICONE 80 MG PO CHEW: 80.0000 mg | CHEWABLE_TABLET | ORAL | Status: DC | PRN

## 2017-08-31 MED ORDER — COCONUT OIL OIL
1.0000 "application " | TOPICAL_OIL | Status: DC | PRN
  Administered 2017-09-01: 1 via TOPICAL
  Filled 2017-08-31: qty 120

## 2017-08-31 MED ORDER — AMMONIA AROMATIC IN INHA
RESPIRATORY_TRACT | Status: AC
  Filled 2017-08-31: qty 10

## 2017-08-31 MED ORDER — OXYTOCIN 10 UNIT/ML IJ SOLN
INTRAMUSCULAR | Status: AC
  Filled 2017-08-31: qty 2

## 2017-08-31 MED ORDER — ZOLPIDEM TARTRATE 5 MG PO TABS: 5.0000 mg | ORAL_TABLET | Freq: Every evening | ORAL | Status: DC | PRN

## 2017-08-31 MED ORDER — SODIUM CHLORIDE 0.9% FLUSH: 3.0000 mL | INTRAVENOUS | Status: DC | PRN

## 2017-08-31 MED ORDER — PRENATAL MULTIVITAMIN CH
1.0000 | ORAL_TABLET | Freq: Every day | ORAL | Status: DC
  Administered 2017-08-31 – 2017-09-01 (×2): 1 via ORAL
  Filled 2017-08-31 (×2): qty 1

## 2017-08-31 MED ORDER — ONDANSETRON HCL 4 MG/2ML IJ SOLN: 4.0000 mg | INTRAMUSCULAR | Status: DC | PRN

## 2017-08-31 MED ORDER — SODIUM CHLORIDE 0.9 % IV SOLN: 250.0000 mL | INTRAVENOUS | Status: DC | PRN

## 2017-08-31 MED ORDER — LIDOCAINE HCL (PF) 1 % IJ SOLN
INTRAMUSCULAR | Status: AC
  Filled 2017-08-31: qty 30

## 2017-08-31 MED ORDER — WITCH HAZEL-GLYCERIN EX PADS: 1.0000 "application " | MEDICATED_PAD | CUTANEOUS | Status: DC | PRN

## 2017-08-31 MED ORDER — MEASLES, MUMPS & RUBELLA VAC ~~LOC~~ INJ
0.5000 mL | INJECTION | Freq: Once | SUBCUTANEOUS | Status: DC
  Filled 2017-08-31: qty 0.5

## 2017-08-31 MED ORDER — SODIUM CHLORIDE 0.9% FLUSH
3.0000 mL | Freq: Two times a day (BID) | INTRAVENOUS | Status: DC
  Administered 2017-08-31: 3 mL via INTRAVENOUS

## 2017-08-31 MED ORDER — OXYTOCIN 40 UNITS IN LACTATED RINGERS INFUSION - SIMPLE MED: 62.5000 [IU]/h | INTRAVENOUS | Status: DC

## 2017-08-31 MED ORDER — OXYTOCIN 40 UNITS IN LACTATED RINGERS INFUSION - SIMPLE MED
INTRAVENOUS | Status: AC
  Filled 2017-08-31: qty 1000

## 2017-08-31 MED ORDER — IBUPROFEN 400 MG PO TABS
400.0000 mg | ORAL_TABLET | Freq: Four times a day (QID) | ORAL | Status: DC | PRN
Start: 2017-08-31 — End: 2017-09-02
  Filled 2017-08-31 (×2): qty 1

## 2017-08-31 MED ORDER — SENNOSIDES-DOCUSATE SODIUM 8.6-50 MG PO TABS
2.0000 | ORAL_TABLET | ORAL | Status: DC
  Administered 2017-09-01: 2 via ORAL
  Filled 2017-08-31: qty 2

## 2017-08-31 NOTE — H&P (Signed)
Carmen Prince is a 31 y.o. female presenting for labor S/S and attends [facility] and could not make it to [place]. Carmen Prince is a 31 y.o. female [phone] with LMP of 13/18  And EDD of 08/31/17 presenting for  UC's that were becoming more uncomfortable and came here. Pt goes to [facility] for [facility] and transferred from [facility] in [place], [place]. Pt had 2nd trimester bleeding x 2 episodes and no hx of previa. Pt has also had 2 back surgeries  In 2009 & 2010 and is a candidate for epidural per Dr Macon Large.          OB History    Gravida Para Term Preterm AB Living   0 2 2   SAB TAB Ectopic Multiple Live Births    OB History    Gravida Para Term Preterm AB Living   0 2 2   SAB TAB Ectopic Multiple Live Births   0 2 0 0 2     Past Medical History:  Diagnosis Date  . Abnormal Pap smear of cervix   . Asthma   . Depression    Past Surgical History:  Procedure Laterality Date  . BACK SURGERY  2009  . BACK SURGERY  2011   Family History: family history includes Lung cancer in her maternal grandfather; Prostate cancer in her father. Social History:  reports that she has quit smoking. She has never used smokeless tobacco. She reports that she does not drink alcohol or use drugs.     Maternal Diabetes:122 Genetic Screening: AFP neg Maternal Ultrasounds/Referrals: WNL  Fetal Ultrasounds or other Referrals: N/A Maternal Substance Abuse: Neg  Significant Maternal Medications: PNV, Fe  Significant Maternal Lab Results:  Other Comments:  Review of Systems  Constitutional: Negative.   HENT: Negative.   Eyes: Negative.   Respiratory: Negative.   Cardiovascular: Negative.   Gastrointestinal: Negative.   Genitourinary: Negative.   Musculoskeletal: Negative.   Skin: Negative.   Neurological: Negative.   Endo/Heme/Allergies: Negative.   Psychiatric/Behavioral: Negative.    History Dilation: 10 Effacement (%): 100 Station: 0 Exam by:: ER RN   Blood pressure (!) 128/97, pulse 96, temperature 98.2 F (36.8 C), temperature source Oral, resp. rate 18, height  (1.778 m), weight 103 kg (227 lb), last menstrual period 11/24/2016. Exam Physical Exam  Gen:31 yo black female in NAD HEENT: Neck:supple, eyes non-icteric Lungs:CTA bilat, no W/R/R HEART:S1S2, No M/R/G [id number]:[id number] on arrival UC: active  VE:7/100/vtx-2 Prenatal labs: ABO, Rh: B/Positive/-- (06/26 0000) Antibody: Negative (06/26 0000) Rubella: Immune RPR: Nonreactive (06/26 0000)  HBsAg: Negative (06/26 0000)  HIV: Non-reactive (06/26 0000)  GBS: Negative (09/12 0000)   Assessment/Plan:    Sharee Pimple 08/31/2017, 6:47 AM

## 2017-08-31 NOTE — OB Triage Note (Signed)
Patient came in c/o of contractions since 2100 last night. Contractions per patient are back to back. Denies vaginal bleeding. Denies LOF.

## 2017-08-31 NOTE — Discharge Instructions (Signed)

## 2017-08-31 NOTE — Discharge Summary (Addendum)
Obstetric Discharge Summary Reason for Admission: Labor S/S Prenatal Procedures: [place] Intrapartum Procedures: None Postpartum Procedures: None Complications-Operative and Postpartum:  Hemoglobin  Date Value Ref Range Status  05/17/2017 10.1  Final   HCT  Date Value Ref Range Status  05/17/2017 31  Final    Physical Exam:  General:a,A&O x3 Heart:S1S2, RRR, No M/R/G Lungs:CTA bilat, no W/R/R Lochia:mod, no clots Uterine Fundus:FF and U-1 Incision:Intact perineum DVT Evaluation: Neg  Voiding and taking po well  Discharge Diagnoses: IUP at term with NSVD of viable infant with intact perineum  Discharge Information: Date: 08/31/2017 Activity: Up ad lib Diet:Reg Medications: PNV, Fe, Ibuprofen 600 mg by mouth every 6-8 hours for pain  Condition:Stable  Instructions: No driving x 2 weeks, no sex x 6 weeks. Give pt address and phone number of [facility], [address] (next to ER), [place], [place] [address] [phone] for appt at 6 weeks. If pt wants a Nexplanon, we can order and place at that visit so she needs to tell the receptionist that she wants it and we can order for her.  Discharge to: Home   Newborn Data: Live born female  Birth Weight:   APGAR: 8, 9  Newborn Delivery   Birth date/time:  08/31/2017 05:29:00 Delivery type:  Vaginal, Spontaneous Delivery      Mom to Peds since baby may have to stay overnight on the bili light.   Sharee Pimple 08/31/2017, 7:01 AM

## 2017-09-01 ENCOUNTER — Other Ambulatory Visit: Payer: Medicaid Other

## 2017-09-01 LAB — CBC
HEMATOCRIT: 26.5 % — AB (ref 35.0–47.0)
Hemoglobin: 8.6 g/dL — ABNORMAL LOW (ref 12.0–16.0)
MCH: 24.8 pg — ABNORMAL LOW (ref 26.0–34.0)
MCHC: 32.6 g/dL (ref 32.0–36.0)
MCV: 76.1 fL — AB (ref 80.0–100.0)
Platelets: 260 10*3/uL (ref 150–440)
RBC: 3.48 MIL/uL — ABNORMAL LOW (ref 3.80–5.20)
RDW: 16.1 % — AB (ref 11.5–14.5)
WBC: 9.6 10*3/uL (ref 3.6–11.0)

## 2017-09-01 LAB — HIV ANTIBODY (ROUTINE TESTING W REFLEX): HIV Screen 4th Generation wRfx: NONREACTIVE

## 2017-09-01 LAB — RPR: RPR Ser Ql: NONREACTIVE

## 2017-09-01 NOTE — Progress Notes (Signed)
Post Partum Day 1 Subjective: no complaints, up ad lib and voiding  Objective: Blood pressure 110/73, pulse 73, temperature 97.8 F (36.6 C), temperature source Oral, resp. rate 20, height  (1.778 m), weight 103 kg (227 lb), last menstrual period 11/24/2016, SpO2 99 %, unknown if currently breastfeeding.  Physical Exam:  General: alert and cooperative Lochia: appropriate Uterine Fundus: firm DVT Evaluation: No evidence of DVT seen on physical exam.   Recent Labs  08/31/17 0817 09/01/17 0449  HGB 10.1* 8.6*  HCT 30.4* 26.5*    Assessment/Plan:  Standard postpartum care Acute on chronic anemia: PO FE supplementation. 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

## 2017-09-02 NOTE — Progress Notes (Signed)
Reviewed all patients discharge instructions and handouts regarding postpartum bleeding, no intercourse for 6 weeks, signs and symptoms of mastitis and postpartum bleu's. All questions have been answered at this time. Patient rooming in with infant.

## 2017-09-02 NOTE — Progress Notes (Addendum)
Post Partum Day 2 Subjective: Baby will not know if going home till 1400.  Objective: Blood pressure 120/68, pulse 86, temperature 98.4 F (36.9 C), temperature source Oral, resp. rate 18, height  (1.778 m), weight 103 kg (227 lb), last menstrual period 11/24/2016, SpO2 99 %, unknown if currently breastfeeding.  Physical Exam:  General:  A,A&O x3 Heart:S1S2, RRR, No M/R/G Lungs:CTA bilat, no W/R/R Lochia: mod without clots Uterine Fundus: FF, U-1 Incision: Intact perineum DVT Evaluation: neg DVT   Recent Labs  08/31/17 0817 09/01/17 0449  HGB 10.1* 8.6*  HCT 30.4* 26.5*    Assessment/Plan: A:1. PPD#2 stable 2. NSVD of viable infant with intact perineum 3. Anemia P:1. OTC Fe 2. Rest 3. Plans to stay on Peds after dc 4. Breast and bottle  5. Plans Nexplanon 6. FU at [facility] at 6 weeks for PP care and birth control plans   LOS: 2 days   Sharee Pimple 09/02/2017, 9:04 AM

## 2017-09-05 ENCOUNTER — Encounter: Payer: Medicaid Other | Admitting: Family Medicine

## 2017-09-07 ENCOUNTER — Inpatient Hospital Stay (HOSPITAL_COMMUNITY): Admission: RE | Admit: 2017-09-07 | Payer: Medicaid Other | Source: Ambulatory Visit

## 2017-12-08 ENCOUNTER — Ambulatory Visit
Admit: 2017-12-08 | Discharge: 2017-12-08 | Payer: PRIVATE HEALTH INSURANCE | Attending: Family Medicine | Primary: Family Medicine

## 2017-12-08 DIAGNOSIS — F331 Major depressive disorder, recurrent, moderate: Secondary | ICD-10-CM

## 2017-12-08 MED ORDER — LAMOTRIGINE 100 MG TAB
100 mg | ORAL_TABLET | Freq: Every day | ORAL | 2 refills | Status: DC
Start: 2017-12-08 — End: 2018-01-05

## 2017-12-08 NOTE — Progress Notes (Signed)
Chief Complaint   Patient presents with   ??? New Patient     Establish Care     Pt seen in the office today to establish care    Pt reports that she she is concerned about depression, seems to come in waves. Pt has three children.     Pt reports that she has stretches where it is hard to get out of bed, feels like it is affecting her children.    Pt reports that she has been on medication and in therapy for years with minimal benefit. Pt reports that she has not had any help for a few years. Pt is not interested in resuming counseling.     Pt reports that she was on zoloft and cymbalta, vistaril.     Pt reports that she has had thoughts that things would be better if she was not here, no specific plans, these thoughts have not been present for 2 or more weeks.   No homicidal thoughts.     Subjective: (As above and below)     Chief Complaint   Patient presents with   ??? New Patient     Establish Care     she is a 32 y.o. year old female who presents for evaluation.    Reviewed PmHx, RxHx, FmHx, SocHx, AllgHx and updated in chart.    Review of Systems - negative except as listed above    Objective:     Vitals:    12/08/17 0950   BP: 114/76   Pulse: 80   Resp: 16   Temp: 98.4 ??F (36.9 ??C)   TempSrc: Oral   SpO2: 98%   Weight: 216 lb (98 kg)   Height: 5' 9.5" (1.765 m)     Physical Examination: General appearance - alert, well appearing, and in no distress  Mental status - depressed mood  Mouth - mucous membranes moist, pharynx normal without lesions  Chest - clear to auscultation, no wheezes, rales or rhonchi, symmetric air entry  Heart - normal rate, regular rhythm, normal S1, S2, no murmurs, rubs, clicks or gallops  Musculoskeletal - no joint tenderness, deformity or swelling  Extremities - peripheral pulses normal, no pedal edema, no clubbing or cyanosis    Assessment/ Plan:   1. Moderate episode of recurrent major depressive disorder (HCC)  -pt has not responded well to medication in the past, try alternative  medication Lamictal, pt reports some bipolar feature including some manic spells, primarily depression.     Follow up in 3-4 weeks    I have discussed the diagnosis with the patient and the intended plan as seen in the above orders.  The patient has received an after-visit summary and questions were answered concerning future plans.     Medication Side Effects and Warnings were discussed with patient: yes  Patient Labs were reviewed: yes  Patient Past Records were reviewed:  yes    Denise Huber, M.D.

## 2017-12-09 NOTE — Telephone Encounter (Signed)
Pt calling and would like to talk to a nurse but would not say why. I explained that they will be back on Monday and she stated that was fine. Please call her back at [phone].

## 2017-12-12 NOTE — Telephone Encounter (Signed)
I have attempted without success to contact this patient by phone to return their call.

## 2017-12-14 NOTE — Telephone Encounter (Signed)
Pt stating the medication makes her feel like a "Zombie" and reports when she took the medication over the weekend her family informed her they had a difficult time waking her. She also states the medication makes her feel as if she "cant move". Any further orders or advisements? Pt states she is willing to try something else. She also said this has been the 4th medication she has tried.

## 2017-12-15 MED ORDER — BUPROPION XL 150 MG 24 HR TAB
150 mg | ORAL_TABLET | ORAL | 0 refills | Status: DC
Start: 2017-12-15 — End: 2018-01-05

## 2017-12-15 NOTE — Telephone Encounter (Signed)
Please advise pt that an alternate medication has been sent to the pharmacy on file. Please also encourage patient to seek psychiatric options through her insurance company due to multiple ineffective medications and difficulty finding therapeutic treatment .

## 2017-12-16 NOTE — Telephone Encounter (Signed)
LVM instructing pt to return call.

## 2017-12-16 NOTE — Telephone Encounter (Signed)
-----   Message from [place] sent at 12/16/2017  2:42 PM EST -----  Regarding: Dr. Sherryl Barters: [phone]  Pt returned call from nurse Best contact [phone]

## 2017-12-20 NOTE — Telephone Encounter (Signed)
Patient id x 3, notified as per Dr. Risser and verbalized understanding.

## 2017-12-20 NOTE — Telephone Encounter (Signed)
Patient returned call on lunch break now at   [phone]    After lunch will not be available until 3:30

## 2017-12-20 NOTE — Telephone Encounter (Signed)
Attempt to reach pt unsuccessful. LVM for patient to notify as per Dr. Matilde Haymaker.

## 2018-01-05 ENCOUNTER — Ambulatory Visit
Admit: 2018-01-05 | Discharge: 2018-01-05 | Payer: PRIVATE HEALTH INSURANCE | Attending: Family Medicine | Primary: Family Medicine

## 2018-01-05 DIAGNOSIS — F331 Major depressive disorder, recurrent, moderate: Secondary | ICD-10-CM

## 2018-01-05 MED ORDER — BUPROPION XL 150 MG 24 HR TAB
150 mg | ORAL_TABLET | ORAL | 2 refills | Status: DC
Start: 2018-01-05 — End: 2018-12-14

## 2018-01-05 MED ORDER — HYDROXYZINE PAMOATE 25 MG CAP
25 mg | ORAL_CAPSULE | ORAL | 3 refills | Status: DC
Start: 2018-01-05 — End: 2019-02-08

## 2018-01-05 NOTE — Progress Notes (Signed)
Chief Complaint   Patient presents with   ??? Depression     4 week f/u     Pt reports that lamictal did not work well, was overly sleepy.     Medication was changed to wellbutrin, reports that she feels like the medication makes her sleepy. Pt reports that mood has been up and down.     Vistaril has been helping with anxiety, only takes as needed.     Subjective: (As above and below)     Chief Complaint   Patient presents with   ??? Depression     4 week f/u     she is a 32 y.o. year old female who presents for evaluation.    Reviewed PmHx, RxHx, FmHx, SocHx, AllgHx and updated in chart.    Review of Systems - negative except as listed above    Objective:     Vitals:    01/05/18 1012   BP: 109/70   Pulse: 79   Resp: 18   Temp: 98.5 ??F (36.9 ??C)   TempSrc: Oral   SpO2: 97%   Weight: 218 lb (98.9 kg)   Height: 5' 9.5" (1.765 m)     Physical Examination: General appearance - alert, well appearing, and in no distress  Mental status - normal mood, behavior, speech, dress, motor activity, and thought processes  Mouth - mucous membranes moist, pharynx normal without lesions  Chest - clear to auscultation, no wheezes, rales or rhonchi, symmetric air entry  Heart - normal rate, regular rhythm, normal S1, S2, no murmurs, rubs, clicks or gallops  Musculoskeletal - no joint tenderness, deformity or swelling  Extremities - peripheral pulses normal, no pedal edema, no clubbing or cyanosis    Assessment/ Plan:   1. Moderate episode of recurrent major depressive disorder (HCC)  -continue on wellbutrin at current dose, vistaril as needed  - hydrOXYzine pamoate (VISTARIL) 25 mg capsule; TK 1 C PO TID PRN .  Dispense: 60 Cap; Refill: 3  - buPROPion XL (WELLBUTRIN XL) 150 mg tablet; Take 1 Tab by mouth every morning.  Dispense: 30 Tab; Refill: 2     Follow-up Disposition: As needed  I have discussed the diagnosis with the patient and the intended plan as seen in the above orders.  The patient has received an after-visit summary  and questions were answered concerning future plans.     Medication Side Effects and Warnings were discussed with patient: yes  Patient Labs were reviewed: yes  Patient Past Records were reviewed:  yes    Denise Huber, M.D.

## 2018-01-05 NOTE — Progress Notes (Signed)
Identified pt with two pt identifiers(name and DOB). Reviewed record in preparation for visit and have obtained necessary documentation.  Chief Complaint   Patient presents with   ??? Depression     4 week f/u       Health Maintenance Due   Topic   ??? DTaP/Tdap/Td series (1 - Tdap)   ??? Influenza Age 30 to Adult    ??? PAP AKA CERVICAL CYTOLOGY        3 most recent PHQ Screens 01/05/2018   Little interest or pleasure in doing things Several days   Feeling down, depressed, irritable, or hopeless Several days   Total Score PHQ 2 2   Trouble falling or staying asleep, or sleeping too much Nearly every day   Feeling tired or having little energy Nearly every day   Poor appetite, weight loss, or overeating Several days   Feeling bad about yourself - or that you are a failure or have let yourself or your family down Nearly every day   Trouble concentrating on things such as school, work, reading, or watching TV Several days   Moving or speaking so slowly that other people could have noticed; or the opposite being so fidgety that others notice Not at all   Thoughts of being better off dead, or hurting yourself in some way Several days   PHQ 9 Score 14   How difficult have these problems made it for you to do your work, take care of your home and get along with others Extremely difficult         Patient is not abused     Vitals:    01/05/18 1012   BP: 109/70   Pulse: 79   Resp: 18   Temp: 98.5 ??F (36.9 ??C)   TempSrc: Oral   SpO2: 97%   Weight: 218 lb (98.9 kg)   Height: 5' 9.5" (1.765 m)   PainSc:   4   PainLoc: Teeth   LMP: 12/21/2017       Coordination of Care Questionnaire:  :   1) Have you been to an emergency room, urgent care, or hospitalized since your last visit?   no       2. Have seen or consulted any other health care provider since your last visit? YES  Dentist, [facility] [address]    3) Do you have an Advanced Directive/ Living Will in place? NO  If yes, do we have a copy on file NO   If no, would you like information NO    Patient is accompanied by self I have received verbal consent from [name] to discuss any/all medical information while they are present in the room.

## 2018-10-06 ENCOUNTER — Inpatient Hospital Stay: Admit: 2018-10-06 | Discharge: 2018-10-06 | Payer: PRIVATE HEALTH INSURANCE | Attending: Emergency Medicine

## 2018-10-06 DIAGNOSIS — Z5321 Procedure and treatment not carried out due to patient leaving prior to being seen by health care provider: Secondary | ICD-10-CM

## 2018-10-06 LAB — CBC WITH AUTO DIFFERENTIAL
Basophils %: 0 % (ref 0–1)
Basophils Absolute: 0 10*3/uL (ref 0.0–0.1)
Eosinophils %: 3 % (ref 0–7)
Eosinophils Absolute: 0.3 10*3/uL (ref 0.0–0.4)
Granulocyte Absolute Count: 0 10*3/uL (ref 0.00–0.04)
Hematocrit: 33.8 % — ABNORMAL LOW (ref 35.0–47.0)
Hemoglobin: 10.9 g/dL — ABNORMAL LOW (ref 11.5–16.0)
Immature Granulocytes: 0 % (ref 0.0–0.5)
Lymphocytes %: 18 % (ref 12–49)
Lymphocytes Absolute: 1.9 10*3/uL (ref 0.8–3.5)
MCH: 24.9 PG — ABNORMAL LOW (ref 26.0–34.0)
MCHC: 32.2 g/dL (ref 30.0–36.5)
MCV: 77.3 FL — ABNORMAL LOW (ref 80.0–99.0)
MPV: 10.1 FL (ref 8.9–12.9)
Monocytes %: 7 % (ref 5–13)
Monocytes Absolute: 0.7 10*3/uL (ref 0.0–1.0)
NRBC Absolute: 0 10*3/uL (ref 0.00–0.01)
Neutrophils %: 72 % (ref 32–75)
Neutrophils Absolute: 8 10*3/uL (ref 1.8–8.0)
Nucleated RBCs: 0 PER 100 WBC
Platelets: 450 10*3/uL — ABNORMAL HIGH (ref 150–400)
RBC: 4.37 M/uL (ref 3.80–5.20)
RDW: 16.4 % — ABNORMAL HIGH (ref 11.5–14.5)
WBC: 11 10*3/uL (ref 3.6–11.0)

## 2018-10-06 LAB — URINALYSIS W/ REFLEX CULTURE
Bilirubin, Urine: NEGATIVE
Bilirubin: NEGATIVE
Blood, Urine: NEGATIVE
Blood: NEGATIVE
Glucose, Ur: NEGATIVE mg/dL
Glucose: NEGATIVE mg/dL
Leukocyte Esterase, Urine: NEGATIVE
Leukocyte Esterase: NEGATIVE
Nitrite, Urine: NEGATIVE
Nitrites: NEGATIVE
Protein, UA: NEGATIVE mg/dL
Protein: NEGATIVE mg/dL
Specific Gravity, UA: 1.018 (ref 1.003–1.030)
Specific gravity: 1.018 (ref 1.003–1.030)
Urobilinogen, UA, POCT: 0.2 EU/dL (ref 0.2–1.0)
Urobilinogen: 0.2 EU/dL (ref 0.2–1.0)
pH (UA): 6.5 (ref 5.0–8.0)
pH, UA: 6.5 (ref 5.0–8.0)

## 2018-10-06 LAB — COMPREHENSIVE METABOLIC PANEL
ALT: 21 U/L (ref 12–78)
AST: 30 U/L (ref 15–37)
Albumin/Globulin Ratio: 0.8 — ABNORMAL LOW (ref 1.1–2.2)
Albumin: 3.4 g/dL — ABNORMAL LOW (ref 3.5–5.0)
Alkaline Phosphatase: 120 U/L — ABNORMAL HIGH (ref 45–117)
Anion Gap: 6 mmol/L (ref 5–15)
BUN: 5 MG/DL — ABNORMAL LOW (ref 6–20)
Bun/Cre Ratio: 6 — ABNORMAL LOW (ref 12–20)
CO2: 28 mmol/L (ref 21–32)
Calcium: 8.7 MG/DL (ref 8.5–10.1)
Chloride: 104 mmol/L (ref 97–108)
Creatinine: 0.82 MG/DL (ref 0.55–1.02)
EGFR IF NonAfrican American: >60 mL/min/{1.73_m2} (ref >60)
GFR African American: >60 mL/min/{1.73_m2} (ref >60)
Globulin: 4.1 g/dL — ABNORMAL HIGH (ref 2.0–4.0)
Glucose: 89 mg/dL (ref 65–100)
Potassium: 4.2 mmol/L (ref 3.5–5.1)
Sodium: 138 mmol/L (ref 136–145)
Total Bilirubin: 0.4 MG/DL (ref 0.2–1.0)
Total Protein: 7.5 g/dL (ref 6.4–8.2)

## 2018-10-06 LAB — HCG URINE, QL
HCG urine, QL: NEGATIVE
Pregnancy Test(Urn): NEGATIVE

## 2018-10-06 LAB — LIPASE
Lipase: 138 U/L (ref 73–393)
Lipase: 138 U/L (ref 73–393)

## 2018-10-06 LAB — CBC WITH AUTOMATED DIFF
ABS. BASOPHILS: 0 10*3/uL (ref 0.0–0.1)
ABS. EOSINOPHILS: 0.3 10*3/uL (ref 0.0–0.4)
ABS. IMM. GRANS.: 0 10*3/uL (ref 0.00–0.04)
ABS. LYMPHOCYTES: 1.9 10*3/uL (ref 0.8–3.5)
ABS. MONOCYTES: 0.7 10*3/uL (ref 0.0–1.0)
ABS. NEUTROPHILS: 8 10*3/uL (ref 1.8–8.0)
ABSOLUTE NRBC: 0 10*3/uL (ref 0.00–0.01)
BASOPHILS: 0 % (ref 0–1)
EOSINOPHILS: 3 % (ref 0–7)
HCT: 33.8 % — ABNORMAL LOW (ref 35.0–47.0)
HGB: 10.9 g/dL — ABNORMAL LOW (ref 11.5–16.0)
IMMATURE GRANULOCYTES: 0 % (ref 0.0–0.5)
LYMPHOCYTES: 18 % (ref 12–49)
MCH: 24.9 PG — ABNORMAL LOW (ref 26.0–34.0)
MCHC: 32.2 g/dL (ref 30.0–36.5)
MCV: 77.3 FL — ABNORMAL LOW (ref 80.0–99.0)
MONOCYTES: 7 % (ref 5–13)
MPV: 10.1 FL (ref 8.9–12.9)
NEUTROPHILS: 72 % (ref 32–75)
NRBC: 0 PER 100 WBC
PLATELET: 450 10*3/uL — ABNORMAL HIGH (ref 150–400)
RBC: 4.37 M/uL (ref 3.80–5.20)
RDW: 16.4 % — ABNORMAL HIGH (ref 11.5–14.5)
WBC: 11 10*3/uL (ref 3.6–11.0)

## 2018-10-06 LAB — METABOLIC PANEL, COMPREHENSIVE
A-G Ratio: 0.8 — ABNORMAL LOW (ref 1.1–2.2)
ALT (SGPT): 21 U/L (ref 12–78)
AST (SGOT): 30 U/L (ref 15–37)
Albumin: 3.4 g/dL — ABNORMAL LOW (ref 3.5–5.0)
Alk. phosphatase: 120 U/L — ABNORMAL HIGH (ref 45–117)
Anion gap: 6 mmol/L (ref 5–15)
BUN/Creatinine ratio: 6 — ABNORMAL LOW (ref 12–20)
BUN: 5 MG/DL — ABNORMAL LOW (ref 6–20)
Bilirubin, total: 0.4 MG/DL (ref 0.2–1.0)
CO2: 28 mmol/L (ref 21–32)
Calcium: 8.7 MG/DL (ref 8.5–10.1)
Chloride: 104 mmol/L (ref 97–108)
Creatinine: 0.82 MG/DL (ref 0.55–1.02)
GFR est AA: >60 mL/min/{1.73_m2} (ref >60)
GFR est non-AA: >60 mL/min/{1.73_m2} (ref >60)
Globulin: 4.1 g/dL — ABNORMAL HIGH (ref 2.0–4.0)
Glucose: 89 mg/dL (ref 65–100)
Potassium: 4.2 mmol/L (ref 3.5–5.1)
Protein, total: 7.5 g/dL (ref 6.4–8.2)
Sodium: 138 mmol/L (ref 136–145)

## 2018-10-06 NOTE — ED Notes (Signed)
Patient requested IV to be removed. IV lock removed. Patient eloped following IV removal. Patient alert and oriented. Dr. Robb Matar made aware.

## 2018-10-06 NOTE — ED Notes (Addendum)
Patient requested IV to be removed. IV lock removed. Patient eloped following IV removal. Patient alert and oriented. Dr. Bernstein made aware.

## 2018-10-07 ENCOUNTER — Inpatient Hospital Stay
Admit: 2018-10-07 | Discharge: 2018-10-07 | Disposition: A | Discharge status: Home / Self Care | Payer: PRIVATE HEALTH INSURANCE | Attending: Emergency Medicine

## 2018-10-07 ENCOUNTER — Emergency Department: Admit: 2018-10-07 | Payer: PRIVATE HEALTH INSURANCE | Primary: Family Medicine

## 2018-10-07 DIAGNOSIS — K529 Noninfective gastroenteritis and colitis, unspecified: Secondary | ICD-10-CM

## 2018-10-07 LAB — COMPREHENSIVE METABOLIC PANEL
ALT: 23 U/L (ref 12–78)
AST: 17 U/L (ref 15–37)
Albumin/Globulin Ratio: 0.9 — ABNORMAL LOW (ref 1.1–2.2)
Albumin: 3.5 g/dL (ref 3.5–5.0)
Alkaline Phosphatase: 120 U/L — ABNORMAL HIGH (ref 45–117)
Anion Gap: 7 mmol/L (ref 5–15)
BUN: 6 MG/DL (ref 6–20)
Bun/Cre Ratio: 7 — ABNORMAL LOW (ref 12–20)
CO2: 28 mmol/L (ref 21–32)
Calcium: 8.4 MG/DL — ABNORMAL LOW (ref 8.5–10.1)
Chloride: 106 mmol/L (ref 97–108)
Creatinine: 0.91 MG/DL (ref 0.55–1.02)
EGFR IF NonAfrican American: >60 mL/min/{1.73_m2} (ref >60)
GFR African American: >60 mL/min/{1.73_m2} (ref >60)
Globulin: 4 g/dL (ref 2.0–4.0)
Glucose: 98 mg/dL (ref 65–100)
Potassium: 3.8 mmol/L (ref 3.5–5.1)
Sodium: 141 mmol/L (ref 136–145)
Total Bilirubin: 0.2 MG/DL (ref 0.2–1.0)
Total Protein: 7.5 g/dL (ref 6.4–8.2)

## 2018-10-07 LAB — URINALYSIS W/ RFLX MICROSCOPIC
Bilirubin, Urine: NEGATIVE
Bilirubin: NEGATIVE
Blood, Urine: NEGATIVE
Blood: NEGATIVE
Glucose, Ur: NEGATIVE mg/dL
Glucose: NEGATIVE mg/dL
Ketone: NEGATIVE mg/dL
Ketones, Urine: NEGATIVE mg/dL
Leukocyte Esterase, Urine: NEGATIVE
Leukocyte Esterase: NEGATIVE
Nitrite, Urine: NEGATIVE
Nitrites: NEGATIVE
Protein, UA: NEGATIVE mg/dL
Protein: NEGATIVE mg/dL
Specific Gravity, UA: 1.015 (ref 1.003–1.030)
Specific gravity: 1.015 (ref 1.003–1.030)
Urobilinogen, UA, POCT: 0.2 EU/dL (ref 0.2–1.0)
Urobilinogen: 0.2 EU/dL (ref 0.2–1.0)
pH (UA): 7 (ref 5.0–8.0)
pH, UA: 7 (ref 5.0–8.0)

## 2018-10-07 LAB — CBC WITH AUTO DIFFERENTIAL
Basophils %: 0 % (ref 0–1)
Basophils Absolute: 0 10*3/uL (ref 0.0–0.1)
Eosinophils %: 3 % (ref 0–7)
Eosinophils Absolute: 0.3 10*3/uL (ref 0.0–0.4)
Hematocrit: 34.4 % — ABNORMAL LOW (ref 35.0–47.0)
Hemoglobin: 11 g/dL — ABNORMAL LOW (ref 11.5–16.0)
Lymphocytes %: 19 % (ref 12–49)
Lymphocytes Absolute: 2 10*3/uL (ref 0.8–3.5)
MCH: 24.9 PG — ABNORMAL LOW (ref 26.0–34.0)
MCHC: 32 g/dL (ref 30.0–36.5)
MCV: 78 FL — ABNORMAL LOW (ref 80.0–99.0)
MPV: 9.8 FL (ref 8.9–12.9)
Monocytes %: 7 % (ref 5–13)
Monocytes Absolute: 0.7 10*3/uL (ref 0.0–1.0)
Neutrophils %: 71 % (ref 32–75)
Neutrophils Absolute: 7.7 10*3/uL (ref 1.8–8.0)
Platelets: 398 10*3/uL (ref 150–400)
RBC: 4.41 M/uL (ref 3.80–5.20)
RDW: 15.6 % — ABNORMAL HIGH (ref 11.5–14.5)
WBC: 10.7 10*3/uL (ref 3.6–11.0)

## 2018-10-07 LAB — CBC WITH AUTOMATED DIFF
ABS. BASOPHILS: 0 10*3/uL (ref 0.0–0.1)
ABS. EOSINOPHILS: 0.3 10*3/uL (ref 0.0–0.4)
ABS. LYMPHOCYTES: 2 10*3/uL (ref 0.8–3.5)
ABS. MONOCYTES: 0.7 10*3/uL (ref 0.0–1.0)
ABS. NEUTROPHILS: 7.7 10*3/uL (ref 1.8–8.0)
BASOPHILS: 0 % (ref 0–1)
EOSINOPHILS: 3 % (ref 0–7)
HCT: 34.4 % — ABNORMAL LOW (ref 35.0–47.0)
HGB: 11 g/dL — ABNORMAL LOW (ref 11.5–16.0)
LYMPHOCYTES: 19 % (ref 12–49)
MCH: 24.9 PG — ABNORMAL LOW (ref 26.0–34.0)
MCHC: 32 g/dL (ref 30.0–36.5)
MCV: 78 FL — ABNORMAL LOW (ref 80.0–99.0)
MONOCYTES: 7 % (ref 5–13)
MPV: 9.8 FL (ref 8.9–12.9)
NEUTROPHILS: 71 % (ref 32–75)
PLATELET: 398 10*3/uL (ref 150–400)
RBC: 4.41 M/uL (ref 3.80–5.20)
RDW: 15.6 % — ABNORMAL HIGH (ref 11.5–14.5)
WBC: 10.7 10*3/uL (ref 3.6–11.0)

## 2018-10-07 LAB — METABOLIC PANEL, COMPREHENSIVE
A-G Ratio: 0.9 — ABNORMAL LOW (ref 1.1–2.2)
ALT (SGPT): 23 U/L (ref 12–78)
AST (SGOT): 17 U/L (ref 15–37)
Albumin: 3.5 g/dL (ref 3.5–5.0)
Alk. phosphatase: 120 U/L — ABNORMAL HIGH (ref 45–117)
Anion gap: 7 mmol/L (ref 5–15)
BUN/Creatinine ratio: 7 — ABNORMAL LOW (ref 12–20)
BUN: 6 MG/DL (ref 6–20)
Bilirubin, total: 0.2 MG/DL (ref 0.2–1.0)
CO2: 28 mmol/L (ref 21–32)
Calcium: 8.4 MG/DL — ABNORMAL LOW (ref 8.5–10.1)
Chloride: 106 mmol/L (ref 97–108)
Creatinine: 0.91 MG/DL (ref 0.55–1.02)
GFR est AA: >60 mL/min/{1.73_m2} (ref >60)
GFR est non-AA: >60 mL/min/{1.73_m2} (ref >60)
Globulin: 4 g/dL (ref 2.0–4.0)
Glucose: 98 mg/dL (ref 65–100)
Potassium: 3.8 mmol/L (ref 3.5–5.1)
Protein, total: 7.5 g/dL (ref 6.4–8.2)
Sodium: 141 mmol/L (ref 136–145)

## 2018-10-07 MED ORDER — IOPAMIDOL 76 % IV SOLN
370 mg iodine /mL (76 %) | Freq: Once | INTRAVENOUS | Status: AC
Start: 2018-10-07 — End: 2018-10-07
  Administered 2018-10-07: 17:00:00 via INTRAVENOUS

## 2018-10-07 MED ORDER — METRONIDAZOLE 500 MG TAB
500 mg | ORAL_TABLET | Freq: Three times a day (TID) | ORAL | 0 refills | Status: AC
Start: 2018-10-07 — End: 2018-10-14

## 2018-10-07 MED ORDER — CIPROFLOXACIN 500 MG TAB
500 mg | ORAL | Status: AC
Start: 2018-10-07 — End: 2018-10-07
  Administered 2018-10-07: 18:00:00 via ORAL

## 2018-10-07 MED ORDER — CIPROFLOXACIN 500 MG TAB
500 mg | ORAL_TABLET | Freq: Two times a day (BID) | ORAL | 0 refills | Status: AC
Start: 2018-10-07 — End: 2018-10-14

## 2018-10-07 MED ORDER — KETOROLAC TROMETHAMINE 30 MG/ML INJECTION
30 mg/mL (1 mL) | INTRAMUSCULAR | Status: AC
Start: 2018-10-07 — End: 2018-10-07
  Administered 2018-10-07: 16:00:00 via INTRAVENOUS

## 2018-10-07 MED ORDER — METRONIDAZOLE 250 MG TAB
250 mg | ORAL | Status: AC
Start: 2018-10-07 — End: 2018-10-07
  Administered 2018-10-07: 18:00:00 via ORAL

## 2018-10-07 MED FILL — METRONIDAZOLE 250 MG TAB: 250 mg | ORAL | Qty: 2

## 2018-10-07 MED FILL — KETOROLAC TROMETHAMINE 30 MG/ML INJECTION: 30 mg/mL (1 mL) | INTRAMUSCULAR | Qty: 1

## 2018-10-07 MED FILL — CIPROFLOXACIN 500 MG TAB: 500 mg | ORAL | Qty: 1
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
## 2018-10-07 NOTE — ED Notes (Signed)
Pt ambulatory to room.  Pt states lower abdominal pain for the past 3 weeks.  Denies nausea, vomiting, diarrhea

## 2018-10-07 NOTE — ED Notes (Signed)
The patient was discharged home  in stable condition. The patient is alert and oriented, in no respiratory distress and discharge vital signs obtained. The patient's diagnosis, condition and treatment were explained. The patient expressed understanding.  prescriptions given. No work/school note given. A discharge plan has been developed. A case manager was not involved in the process. Aftercare instructions were given.  Pt ambulatory out of the ED.  Pt discharged from the ED with family.

## 2018-10-07 NOTE — ED Triage Notes (Signed)
Pt ambulatory to room.  Pt states lower abdominal pain for the past 3 weeks.  Denies nausea, vomiting, diarrhea
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
## 2018-10-09 NOTE — Progress Notes (Signed)
Received faxed request from [facility] for office notes and labs. Sent message back via fax to [phone] that pt has not been seen since 1/19 and no labs done at our office at all. Confirmation received.

## 2018-10-09 NOTE — Progress Notes (Signed)
Received faxed request from [facility] for office notes and labs. Sent message back via fax to [phone] that pt has not been seen since 1/19 and no labs done at our office at all. Confirmation received.

## 2018-11-23 DIAGNOSIS — K5090 Crohn's disease, unspecified, without complications: Secondary | ICD-10-CM

## 2018-11-23 NOTE — ED Notes (Signed)
2308: Patient verbalizes understanding of discharge instructions given by provider, Dr. Clydene Pugh, MD. Opportunity for questions provided. Patient in no apparent distress. Patient ambulatory upon discharge.   Visit Vitals  BP 133/84 (BP 1 Location: Right arm, BP Patient Position: Sitting)   Pulse (!) 107   Temp 99 F (37.2 C)   Resp 20   SpO2 99%
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
## 2018-11-23 NOTE — ED Notes (Signed)
Recently diagnosed with Crohns disease. She is having severe abdominal pain.

## 2018-11-23 NOTE — ED Triage Notes (Signed)
Recently diagnosed with Crohns disease. She is having severe abdominal pain.
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
## 2018-11-23 NOTE — ED Notes (Signed)
2308: Patient verbalizes understanding of discharge instructions given by provider, Dr. Knott, MD. Opportunity for questions provided. Patient in no apparent distress. Patient ambulatory upon discharge.   Visit Vitals  BP 133/84 (BP 1 Location: Right arm, BP Patient Position: Sitting)   Pulse (!) 107   Temp 99 ??F (37.2 ??C)   Resp 20   SpO2 99%

## 2018-11-24 ENCOUNTER — Inpatient Hospital Stay
Admit: 2018-11-24 | Discharge: 2018-11-24 | Disposition: A | Discharge status: Home / Self Care | Payer: PRIVATE HEALTH INSURANCE | Attending: Emergency Medicine

## 2018-11-24 LAB — COMPREHENSIVE METABOLIC PANEL
ALT: 23 U/L (ref 12–78)
AST: 8 U/L — ABNORMAL LOW (ref 15–37)
Albumin/Globulin Ratio: 0.8 — ABNORMAL LOW (ref 1.1–2.2)
Albumin: 3.3 g/dL — ABNORMAL LOW (ref 3.5–5.0)
Alkaline Phosphatase: 112 U/L (ref 45–117)
Anion Gap: 5 mmol/L (ref 5–15)
BUN: 9 MG/DL (ref 6–20)
Bun/Cre Ratio: 12 (ref 12–20)
CO2: 26 mmol/L (ref 21–32)
Calcium: 8.7 MG/DL (ref 8.5–10.1)
Chloride: 109 mmol/L — ABNORMAL HIGH (ref 97–108)
Creatinine: 0.77 MG/DL (ref 0.55–1.02)
EGFR IF NonAfrican American: >60 mL/min/{1.73_m2} (ref >60)
GFR African American: >60 mL/min/{1.73_m2} (ref >60)
Globulin: 4.2 g/dL — ABNORMAL HIGH (ref 2.0–4.0)
Glucose: 100 mg/dL (ref 65–100)
Potassium: 3.4 mmol/L — ABNORMAL LOW (ref 3.5–5.1)
Sodium: 140 mmol/L (ref 136–145)
Total Bilirubin: 0.3 MG/DL (ref 0.2–1.0)
Total Protein: 7.5 g/dL (ref 6.4–8.2)

## 2018-11-24 LAB — CBC WITH AUTO DIFFERENTIAL
Basophils %: 0 % (ref 0–1)
Basophils Absolute: 0 10*3/uL (ref 0.0–0.1)
Eosinophils %: 4 % (ref 0–7)
Eosinophils Absolute: 0.5 10*3/uL — ABNORMAL HIGH (ref 0.0–0.4)
Granulocyte Absolute Count: 0 10*3/uL (ref 0.00–0.04)
Hematocrit: 32.1 % — ABNORMAL LOW (ref 35.0–47.0)
Hemoglobin: 10.4 g/dL — ABNORMAL LOW (ref 11.5–16.0)
Immature Granulocytes: 0 % (ref 0.0–0.5)
Lymphocytes %: 19 % (ref 12–49)
Lymphocytes Absolute: 2 10*3/uL (ref 0.8–3.5)
MCH: 24.2 PG — ABNORMAL LOW (ref 26.0–34.0)
MCHC: 32.4 g/dL (ref 30.0–36.5)
MCV: 74.7 FL — ABNORMAL LOW (ref 80.0–99.0)
MPV: 9.2 FL (ref 8.9–12.9)
Monocytes %: 9 % (ref 5–13)
Monocytes Absolute: 0.9 10*3/uL (ref 0.0–1.0)
NRBC Absolute: 0 10*3/uL (ref 0.00–0.01)
Neutrophils %: 68 % (ref 32–75)
Neutrophils Absolute: 7.1 10*3/uL (ref 1.8–8.0)
Nucleated RBCs: 0 PER 100 WBC
Platelets: 424 10*3/uL — ABNORMAL HIGH (ref 150–400)
RBC: 4.3 M/uL (ref 3.80–5.20)
RDW: 15.9 % — ABNORMAL HIGH (ref 11.5–14.5)
WBC: 10.5 10*3/uL (ref 3.6–11.0)

## 2018-11-24 LAB — HCG URINE, QL. - POC
HCG, Pregnancy, Urine, POC: NEGATIVE
Pregnancy test,urine (POC): NEGATIVE

## 2018-11-24 LAB — URINALYSIS WITH MICROSCOPIC
Bilirubin, Urine: NEGATIVE
Blood, Urine: NEGATIVE
Glucose, Ur: NEGATIVE mg/dL
Nitrite, Urine: NEGATIVE
Specific Gravity, UA: 1.026 (ref 1.003–1.030)
Urobilinogen, UA, POCT: 1 EU/dL (ref 0.2–1.0)
pH, UA: 7 (ref 5.0–8.0)

## 2018-11-24 LAB — C-REACTIVE PROTEIN: CRP: 3.23 mg/dL — ABNORMAL HIGH (ref 0.00–0.60)

## 2018-11-24 LAB — LIPASE
Lipase: 146 U/L (ref 73–393)
Lipase: 146 U/L (ref 73–393)

## 2018-11-24 LAB — SEDIMENTATION RATE: Sed Rate: 26 mm/hr — ABNORMAL HIGH (ref 0–20)

## 2018-11-24 LAB — CBC WITH AUTOMATED DIFF
ABS. BASOPHILS: 0 10*3/uL (ref 0.0–0.1)
ABS. EOSINOPHILS: 0.5 10*3/uL — ABNORMAL HIGH (ref 0.0–0.4)
ABS. IMM. GRANS.: 0 10*3/uL (ref 0.00–0.04)
ABS. LYMPHOCYTES: 2 10*3/uL (ref 0.8–3.5)
ABS. MONOCYTES: 0.9 10*3/uL (ref 0.0–1.0)
ABS. NEUTROPHILS: 7.1 10*3/uL (ref 1.8–8.0)
ABSOLUTE NRBC: 0 10*3/uL (ref 0.00–0.01)
BASOPHILS: 0 % (ref 0–1)
EOSINOPHILS: 4 % (ref 0–7)
HCT: 32.1 % — ABNORMAL LOW (ref 35.0–47.0)
HGB: 10.4 g/dL — ABNORMAL LOW (ref 11.5–16.0)
IMMATURE GRANULOCYTES: 0 % (ref 0.0–0.5)
LYMPHOCYTES: 19 % (ref 12–49)
MCH: 24.2 PG — ABNORMAL LOW (ref 26.0–34.0)
MCHC: 32.4 g/dL (ref 30.0–36.5)
MCV: 74.7 FL — ABNORMAL LOW (ref 80.0–99.0)
MONOCYTES: 9 % (ref 5–13)
MPV: 9.2 FL (ref 8.9–12.9)
NEUTROPHILS: 68 % (ref 32–75)
NRBC: 0 PER 100 WBC
PLATELET: 424 10*3/uL — ABNORMAL HIGH (ref 150–400)
RBC: 4.3 M/uL (ref 3.80–5.20)
RDW: 15.9 % — ABNORMAL HIGH (ref 11.5–14.5)
WBC: 10.5 10*3/uL (ref 3.6–11.0)

## 2018-11-24 LAB — URINALYSIS W/MICROSCOPIC
Bilirubin: NEGATIVE
Blood: NEGATIVE
Glucose: NEGATIVE mg/dL
Nitrites: NEGATIVE
Specific gravity: 1.026 (ref 1.003–1.030)
Urobilinogen: 1 EU/dL (ref 0.2–1.0)
pH (UA): 7 (ref 5.0–8.0)

## 2018-11-24 LAB — METABOLIC PANEL, COMPREHENSIVE
A-G Ratio: 0.8 — ABNORMAL LOW (ref 1.1–2.2)
ALT (SGPT): 23 U/L (ref 12–78)
AST (SGOT): 8 U/L — ABNORMAL LOW (ref 15–37)
Albumin: 3.3 g/dL — ABNORMAL LOW (ref 3.5–5.0)
Alk. phosphatase: 112 U/L (ref 45–117)
Anion gap: 5 mmol/L (ref 5–15)
BUN/Creatinine ratio: 12 (ref 12–20)
BUN: 9 MG/DL (ref 6–20)
Bilirubin, total: 0.3 MG/DL (ref 0.2–1.0)
CO2: 26 mmol/L (ref 21–32)
Calcium: 8.7 MG/DL (ref 8.5–10.1)
Chloride: 109 mmol/L — ABNORMAL HIGH (ref 97–108)
Creatinine: 0.77 MG/DL (ref 0.55–1.02)
GFR est AA: >60 mL/min/{1.73_m2} (ref >60)
GFR est non-AA: >60 mL/min/{1.73_m2} (ref >60)
Globulin: 4.2 g/dL — ABNORMAL HIGH (ref 2.0–4.0)
Glucose: 100 mg/dL (ref 65–100)
Potassium: 3.4 mmol/L — ABNORMAL LOW (ref 3.5–5.1)
Protein, total: 7.5 g/dL (ref 6.4–8.2)
Sodium: 140 mmol/L (ref 136–145)

## 2018-11-24 LAB — C REACTIVE PROTEIN, QT: C-Reactive protein: 3.23 mg/dL — ABNORMAL HIGH (ref 0.00–0.60)

## 2018-11-24 LAB — SAMPLES BEING HELD

## 2018-11-24 LAB — SED RATE (ESR): Sed rate, automated: 26 mm/hr — ABNORMAL HIGH (ref 0–20)

## 2018-11-24 LAB — URINE CULTURE HOLD SAMPLE

## 2018-11-24 MED ORDER — ACETAMINOPHEN 500 MG TAB
500 mg | ORAL_TABLET | Freq: Four times a day (QID) | ORAL | 0 refills | Status: DC | PRN
Start: 2018-11-24 — End: 2019-02-08

## 2018-11-24 MED ORDER — ACETAMINOPHEN 500 MG TAB
500 mg | ORAL | Status: AC
Start: 2018-11-24 — End: 2018-11-23
  Administered 2018-11-24: 03:00:00 via ORAL

## 2018-11-24 MED ORDER — SODIUM CHLORIDE 0.9% BOLUS IV
0.9 % | INTRAVENOUS | Status: AC
Start: 2018-11-24 — End: 2018-11-23
  Administered 2018-11-24: 03:00:00 via INTRAVENOUS

## 2018-11-24 MED ORDER — MESALAMINE CR 500 MG CAP
500 mg | ORAL_CAPSULE | Freq: Four times a day (QID) | ORAL | 0 refills | Status: AC
Start: 2018-11-24 — End: 2018-12-07

## 2018-11-24 MED ORDER — OXYCODONE 5 MG TAB
5 mg | ORAL_TABLET | Freq: Four times a day (QID) | ORAL | 0 refills | Status: AC | PRN
Start: 2018-11-24 — End: 2018-11-26

## 2018-11-24 MED ORDER — PREDNISONE 20 MG TAB
20 mg | ORAL | Status: AC
Start: 2018-11-24 — End: 2018-11-23
  Administered 2018-11-24: 03:00:00 via ORAL

## 2018-11-24 MED ORDER — ONDANSETRON (PF) 4 MG/2 ML INJECTION
4 mg/2 mL | INTRAMUSCULAR | Status: DC | PRN
Start: 2018-11-24 — End: 2018-11-24
  Administered 2018-11-24: 03:00:00 via INTRAVENOUS

## 2018-11-24 MED ORDER — FENTANYL CITRATE (PF) 50 MCG/ML IJ SOLN
50 mcg/mL | INTRAMUSCULAR | Status: DC | PRN
Start: 2018-11-24 — End: 2018-11-24
  Administered 2018-11-24: 04:00:00 via INTRAVENOUS

## 2018-11-24 MED ORDER — KETOROLAC TROMETHAMINE 30 MG/ML INJECTION
30 mg/mL (1 mL) | INTRAMUSCULAR | Status: AC
Start: 2018-11-24 — End: 2018-11-23
  Administered 2018-11-24: 03:00:00 via INTRAVENOUS

## 2018-11-24 MED ORDER — SUCRALFATE 1 GRAM TAB
1 gram | ORAL | Status: AC
Start: 2018-11-24 — End: 2018-11-23
  Administered 2018-11-24: 03:00:00 via ORAL

## 2018-11-24 MED ORDER — PREDNISONE 10 MG TAB
10 mg | ORAL_TABLET | ORAL | 0 refills | Status: DC
Start: 2018-11-24 — End: 2019-02-08

## 2018-11-24 MED FILL — ONDANSETRON (PF) 4 MG/2 ML INJECTION: 4 mg/2 mL | INTRAMUSCULAR | Qty: 2

## 2018-11-24 MED FILL — PREDNISONE 20 MG TAB: 20 mg | ORAL | Qty: 3

## 2018-11-24 MED FILL — SUCRALFATE 1 GRAM TAB: 1 gram | ORAL | Qty: 1

## 2018-11-24 MED FILL — MAPAP EXTRA STRENGTH 500 MG TABLET: 500 mg | ORAL | Qty: 2

## 2018-11-24 MED FILL — KETOROLAC TROMETHAMINE 30 MG/ML INJECTION: 30 mg/mL (1 mL) | INTRAMUSCULAR | Qty: 1

## 2018-11-24 MED FILL — SODIUM CHLORIDE 0.9 % IV: INTRAVENOUS | Qty: 1000

## 2018-11-24 MED FILL — FENTANYL CITRATE (PF) 50 MCG/ML IJ SOLN: 50 mcg/mL | INTRAMUSCULAR | Qty: 2

## 2018-12-14 ENCOUNTER — Ambulatory Visit: Attending: Family Medicine | Primary: Family Medicine

## 2018-12-14 ENCOUNTER — Ambulatory Visit
Admit: 2018-12-14 | Discharge: 2018-12-14 | Payer: PRIVATE HEALTH INSURANCE | Attending: Family Medicine | Primary: Family Medicine

## 2018-12-14 DIAGNOSIS — F331 Major depressive disorder, recurrent, moderate: Secondary | ICD-10-CM

## 2018-12-14 MED ORDER — SERTRALINE 50 MG TAB
50 mg | ORAL_TABLET | Freq: Every day | ORAL | 1 refills | Status: DC
Start: 2018-12-14 — End: 2019-02-08

## 2018-12-14 NOTE — Progress Notes (Signed)
Denise Huber is a 33 y.o. female   Chief Complaint   Patient presents with   ??? Follow-up    pt states she found out she has crohn's disease back in November.  Had a ct which suggested this then had a EGD colo which confiormed this Dec 2.   Pt sees Dr Steva Ready for GI.  Currently on mesalamine.  Has discussed biologics but was told would not be able to have kids in the future?  Pt unsure which biologic he was referring to.  Pt is current on pred 40mg  daily tapering.  Has an appt March 5th.  Has pain from the Crohns and states was advised to see myself to treat this, pt advised I do not manage Crohn's.    Pt states her depression has been worsening due to this.  She has stopped the wellbutrin due to fear of interaction.   Reports the wellbutrin was not even helping.  Pt denies hx of seizure disorder.    No SI/HI just frustrated with new diagnosis and lack of efficacy of meds thus far.    she is a 33 y.o. year old female who presents for evalution.      Reviewed PmHx, RxHx, FmHx, SocHx, AllgHx and updated and dated in the chart.    Review of Systems - negative except as listed above in the HPI    Objective:     Vitals:    12/14/18 1103   BP: 102/60   Pulse: 99   Resp: 16   Temp: 98 ??F (36.7 ??C)   TempSrc: Oral   SpO2: 96%   Weight: 197 lb (89.4 kg)   Height: 5\' 9"  (1.753 m)       Current Outpatient Medications   Medication Sig   ??? mesalamine ER (APRISO) 0.375 gram 24 hour capsule Take 1.5 g by mouth daily.   ??? oxyCODONE IR (ROXICODONE) 5 mg immediate release tablet Take 5 mg by mouth every four (4) hours as needed for Pain.   ??? sertraline (ZOLOFT) 50 mg tablet Take 1 Tab by mouth daily.   ??? predniSONE (DELTASONE) 10 mg tablet Take 5 tablets daily for 4 days, followed by 4 tablets for 2 days, followed by 3 tablets for 2 days, followed by 2 tablets for 2 days, followed by 1 tablet for 2 days for a total of 40 tablets over 12 days   ??? acetaminophen (TYLENOL) 500 mg tablet Take 2 Tabs by mouth every six (6) hours as  needed for Pain.   ??? hydrOXYzine pamoate (VISTARIL) 25 mg capsule TK 1 C PO TID PRN .   ??? ibuprofen (MOTRIN) 800 mg tablet TK 1 T PO Q 6 H PRN   ??? acetaminophen-codeine (TYLENOL #3) 300-30 mg per tablet TK 1 T PO Q 6 H PRN   ??? amoxicillin (AMOXIL) 500 mg capsule TK 1 C PO TID TAT     No current facility-administered medications for this visit.        Physical Examination: General appearance - alert, well appearing, and in no distress  Mental status - depressed mood  Chest - clear to auscultation, no wheezes, rales or rhonchi, symmetric air entry  Heart - normal rate, regular rhythm, normal S1, S2, no murmurs, rubs, clicks or gallops      Assessment/ Plan:   Diagnoses and all orders for this visit:    1. Moderate episode of recurrent major depressive disorder (HCC)  -     sertraline (ZOLOFT) 50 mg  tablet; Take 1 Tab by mouth daily.  reeval in 4-6 weeks  2. Crohn's disease with complication, unspecified gastrointestinal tract location [facility])  Advised this needs to be managed by GI     Follow-up and Dispositions    ?? Return in about 4 weeks (around 01/11/2019), or if symptoms worsen or fail to improve.           I have discussed the diagnosis with the patient and the intended plan as seen in the above orders.  The patient has received an after-visit summary and questions were answered concerning future plans. Pt conveyed understanding of plan.    Medication Side Effects and Warnings were discussed with patient      Dietrich Pates, DO

## 2018-12-14 NOTE — Progress Notes (Signed)
 Chief Complaint   Patient presents with   . Follow-up     Patient presents in office today for f/u to discuss her chron's disease and medications.  No other concerns.    1. Have you been to the ER, urgent care clinic since your last visit?  Hospitalized since your last visit?Yes When: 11/23/18 Where: [facility] ED Reason for visit: crohn's    2. Have you seen or consulted any other health care providers outside of the [facility] since your last visit?  Include any pap smears or colon screening. No    Learning Assessment 12/06/2014   PRIMARY LEARNER Patient   HIGHEST LEVEL OF EDUCATION - PRIMARY LEARNER  GRADUATED HIGH SCHOOL OR GED   BARRIERS PRIMARY LEARNER NONE   CO-LEARNER CAREGIVER No   PRIMARY LANGUAGE ENGLISH   LEARNER PREFERENCE PRIMARY READING   ANSWERED BY Patient   RELATIONSHIP SELF

## 2018-12-14 NOTE — Patient Instructions (Signed)
Crohn's Disease: Care Instructions  Your Care Instructions    Crohn's disease is a lifelong inflammatory bowel disease (IBD). Parts of the digestive tract get swollen and irritated and may develop deep sores called ulcers. Crohn's disease usually occurs in the last part of the small intestine and the first part of the large intestine. But it can develop anywhere from the mouth to the anus.  The main symptoms of Crohn's disease are belly pain, diarrhea, fever, and weight loss. Some people may have constipation. Crohn's disease also sometimes causes problems with the joints, eyes, or skin. Your symptoms may be mild at some times and severe at others. The disease can also go into remission, which means that it is not active and you have no symptoms. Bad attacks of Crohn's disease often have to be treated in the hospital so that you can get medicines and liquids through a tube in your vein, called an IV. This gives your digestive system time to rest and recover.  Talk with your doctor about the best treatments for you. You may need medicines that help prevent or treat flare-ups of the disease. You may need surgery to remove part of your bowel if you have an abnormal opening in the bowel (fistula), an abscess, or a bowel obstruction. In some cases, surgery is needed if medicines do not work. But symptoms often return to other areas of the intestines after surgery.  Learning good self-care can help you reduce your symptoms and manage Crohn's disease.  Follow-up care is a key part of your treatment and safety. Be sure to make and go to all appointments, and call your doctor if you are having problems. It's also a good idea to know your test results and keep a list of the medicines you take.  How can you care for yourself at home?  ?? Take your medicines exactly as prescribed. Call your doctor if you think you are having a problem with your medicine. You will get more details on  the specific medicines your doctor prescribes.  ?? Do not take anti-inflammatory medicines, such as aspirin, ibuprofen (Advil, Motrin), or naproxen (Aleve). They may make your symptoms worse. Do not take any other medicines or herbal products without talking to your doctor first.  ?? Avoid foods that make your symptoms worse. These might include milk, alcohol, high-fiber foods, or spicy foods.  ?? Eat a healthy diet. Make sure to get enough iron. Rectal bleeding may make you lose iron. Good sources of iron include beef, lentils, spinach, raisins, and iron-enriched breads and cereals.  ?? Drink liquid meal replacements if your doctor recommends them. These are high in calories and contain vitamins and minerals. Severe symptoms may make it hard for your body to absorb vitamins and minerals.  ?? Do not smoke. Smoking makes Crohn's disease worse. If you need help quitting, talk to your doctor about stop-smoking programs and medicines. These can increase your chances of quitting for good.  ?? Seek support from friends and family to help cope with Crohn's disease. The illness can affect all parts of your life. Get counseling if you need it.  When should you call for help?  Call 911 anytime you think you may need emergency care. For example, call if:  ?? ?? Your stools are maroon or very bloody.   ?? ?? You passed out (lost consciousness).   ??Call your doctor now or seek immediate medical care if:  ?? ?? You are vomiting.   ?? ?? You   have new or worse belly pain.   ?? ?? You have a fever.   ?? ?? You cannot pass stools or gas.   ?? ?? You have new or more blood in your stools.   ??Watch closely for changes in your health, and be sure to contact your doctor if:  ?? ?? You have new or worse symptoms.   ?? ?? You are losing weight.   ?? ?? You do not get better as expected.   Where can you learn more?  Go to http://www.healthwise.net/GoodHelpConnections.  Enter Y197 in the search box to learn more about "Crohn's Disease: Care Instructions."   Current as of: September 28, 2017  Content Version: 12.2  ?? 2006-2019 [employer]. Care instructions adapted under license by [employer] (which disclaims liability or warranty for this information). If you have questions about a medical condition or this instruction, always ask your healthcare professional. Healthwise, Incorporated disclaims any warranty or liability for your use of this information.

## 2018-12-14 NOTE — Progress Notes (Signed)
Denise Huber is a 33 y.o. female   Chief Complaint   Patient presents with   ??? Follow-up    pt states she found out she has crohn's disease back in November.  Had a ct which suggested this then had a EGD colo which confiormed this Dec 2.   Pt sees Dr Steva Ready for GI.  Currently on mesalamine.  Has discussed biologics but was told would not be able to have kids in the future?  Pt unsure which biologic he was referring to.  Pt is current on pred 40mg  daily tapering.  Has an appt March 5th.  Has pain from the Crohns and states was advised to see myself to treat this, pt advised I do not manage Crohn's.    Pt states her depression has been worsening due to this.  She has stopped the wellbutrin due to fear of interaction.   Reports the wellbutrin was not even helping.  Pt denies hx of seizure disorder.    No SI/HI just frustrated with new diagnosis and lack of efficacy of meds thus far.    she is a 33 y.o. year old female who presents for evalution.      Reviewed PmHx, RxHx, FmHx, SocHx, AllgHx and updated and dated in the chart.    Review of Systems - negative except as listed above in the HPI    Objective:     Vitals:    12/14/18 1103   BP: 102/60   Pulse: 99   Resp: 16   Temp: 98 ??F (36.7 ??C)   TempSrc: Oral   SpO2: 96%   Weight: 197 lb (89.4 kg)   Height: 5\' 9"  (1.753 m)       Current Outpatient Medications   Medication Sig   ??? mesalamine ER (APRISO) 0.375 gram 24 hour capsule Take 1.5 g by mouth daily.   ??? oxyCODONE IR (ROXICODONE) 5 mg immediate release tablet Take 5 mg by mouth every four (4) hours as needed for Pain.   ??? sertraline (ZOLOFT) 50 mg tablet Take 1 Tab by mouth daily.   ??? predniSONE (DELTASONE) 10 mg tablet Take 5 tablets daily for 4 days, followed by 4 tablets for 2 days, followed by 3 tablets for 2 days, followed by 2 tablets for 2 days, followed by 1 tablet for 2 days for a total of 40 tablets over 12 days   ??? acetaminophen (TYLENOL) 500 mg tablet Take 2 Tabs by mouth every six (6)  hours as needed for Pain.   ??? hydrOXYzine pamoate (VISTARIL) 25 mg capsule TK 1 C PO TID PRN .   ??? ibuprofen (MOTRIN) 800 mg tablet TK 1 T PO Q 6 H PRN   ??? acetaminophen-codeine (TYLENOL #3) 300-30 mg per tablet TK 1 T PO Q 6 H PRN   ??? amoxicillin (AMOXIL) 500 mg capsule TK 1 C PO TID TAT     No current facility-administered medications for this visit.        Physical Examination: General appearance - alert, well appearing, and in no distress  Mental status - depressed mood  Chest - clear to auscultation, no wheezes, rales or rhonchi, symmetric air entry  Heart - normal rate, regular rhythm, normal S1, S2, no murmurs, rubs, clicks or gallops      Assessment/ Plan:   Diagnoses and all orders for this visit:    1. Moderate episode of recurrent major depressive disorder (HCC)  -     sertraline (ZOLOFT) 50 mg  tablet; Take 1 Tab by mouth daily.  reeval in 4-6 weeks  2. Crohn's disease with complication, unspecified gastrointestinal tract location [facility])  Advised this needs to be managed by GI     Follow-up and Dispositions    ?? Return in about 4 weeks (around 01/11/2019), or if symptoms worsen or fail to improve.           I have discussed the diagnosis with the patient and the intended plan as seen in the above orders.  The patient has received an after-visit summary and questions were answered concerning future plans. Pt conveyed understanding of plan.    Medication Side Effects and Warnings were discussed with patient      Dietrich Pates, DO

## 2018-12-14 NOTE — Progress Notes (Signed)
Chief Complaint   Patient presents with   ??? Follow-up     Patient presents in office today for f/u to discuss her chron's disease and medications.  No other concerns.    1. Have you been to the ER, urgent care clinic since your last visit?  Hospitalized since your last visit?Yes When: 11/23/18 Where: [facility] ED Reason for visit: crohn's    2. Have you seen or consulted any other health care providers outside of the [facility] since your last visit?  Include any pap smears or colon screening. No    Learning Assessment 12/06/2014   PRIMARY LEARNER Patient   HIGHEST LEVEL OF EDUCATION - PRIMARY LEARNER  GRADUATED HIGH SCHOOL OR GED   BARRIERS PRIMARY LEARNER NONE   CO-LEARNER CAREGIVER No   PRIMARY LANGUAGE ENGLISH   LEARNER PREFERENCE PRIMARY READING   ANSWERED BY Patient   RELATIONSHIP SELF

## 2019-01-18 ENCOUNTER — Encounter: Attending: Family Medicine | Primary: Family Medicine

## 2019-02-08 ENCOUNTER — Ambulatory Visit: Primary: Family Medicine

## 2019-02-08 ENCOUNTER — Ambulatory Visit: Admit: 2019-02-08 | Payer: PRIVATE HEALTH INSURANCE | Primary: Family Medicine

## 2019-02-08 DIAGNOSIS — J069 Acute upper respiratory infection, unspecified: Secondary | ICD-10-CM

## 2019-02-08 LAB — AMB POC RAPID STREP A
Group A Strep Ag: NEGATIVE
Group A Strep Antigen, POC: NEGATIVE
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
## 2019-02-08 NOTE — Patient Instructions (Signed)
Upper Respiratory Infection (Cold): Care Instructions  Your Care Instructions    An upper respiratory infection, or URI, is an infection of the nose, sinuses, or throat. URIs are spread by coughs, sneezes, and direct contact. The common cold is the most frequent kind of URI. The flu and sinus infections are other kinds of URIs.  Almost all URIs are caused by viruses. Antibiotics won't cure them. But you can treat most infections with home care. This may include drinking lots of fluids and taking over-the-counter pain medicine. You will probably feel better in 4 to 10 days.  The doctor has checked you carefully, but problems can develop later. If you notice any problems or new symptoms, get medical treatment right away.  Follow-up care is a key part of your treatment and safety. Be sure to make and go to all appointments, and call your doctor if you are having problems. It's also a good idea to know your test results and keep a list of the medicines you take.  How can you care for yourself at home?  ?? To prevent dehydration, drink plenty of fluids, enough so that your urine is light yellow or clear like water. Choose water and other caffeine-free clear liquids until you feel better. If you have kidney, heart, or liver disease and have to limit fluids, talk with your doctor before you increase the amount of fluids you drink.  ?? Take an over-the-counter pain medicine, such as acetaminophen (Tylenol), ibuprofen (Advil, Motrin), or naproxen (Aleve). Read and follow all instructions on the label.  ?? Before you use cough and cold medicines, check the label. These medicines may not be safe for young children or for people with certain health problems.  ?? Be careful when taking over-the-counter cold or flu medicines and Tylenol at the same time. Many of these medicines have acetaminophen, which is Tylenol. Read the labels to make sure that you are not taking  more than the recommended dose. Too much acetaminophen (Tylenol) can be harmful.  ?? Get plenty of rest.  ?? Do not smoke or allow others to smoke around you. If you need help quitting, talk to your doctor about stop-smoking programs and medicines. These can increase your chances of quitting for good.  When should you call for help?  Call 911 anytime you think you may need emergency care. For example, call if:  ?? ?? You have severe trouble breathing.   ??Call your doctor now or seek immediate medical care if:  ?? ?? You seem to be getting much sicker.   ?? ?? You have new or worse trouble breathing.   ?? ?? You have a new or higher fever.   ?? ?? You have a new rash.   ??Watch closely for changes in your health, and be sure to contact your doctor if:  ?? ?? You have a new symptom, such as a sore throat, an earache, or sinus pain.   ?? ?? You cough more deeply or more often, especially if you notice more mucus or a change in the color of your mucus.   ?? ?? You do not get better as expected.   Where can you learn more?  Go to http://www.healthwise.net/GoodHelpConnections  Enter K520 in the search box to learn more about "Upper Respiratory Infection (Cold): Care Instructions."  Current as of: June 9, 2019Content Version: 12.4  ?? 2006-2020 [employer].  Care instructions adapted under license by [employer] (which disclaims liability or warranty for this information).   If you have questions about a medical condition or this instruction, always ask your healthcare professional. [employer], [employer] disclaims any warranty or liability for your use of this information.
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
## 2019-02-08 NOTE — Telephone Encounter (Signed)
Called and spoke with patient. She states that she has cough, and congestion. Denies any fever, chills and body aches, or SOB. States that she was tested at work today for flu and was negative but was sent home. She is currently being seen at [facility] being tested for strep. I advised that since she is being [facility] at the [facility] ask them to write a note for work and we are not currently seeing patients. Patient verbalized understanding.

## 2019-02-08 NOTE — Telephone Encounter (Signed)
Please see if she truly needs an appointment. Denise Huber

## 2019-02-08 NOTE — Telephone Encounter (Signed)
Patient called and states that she was sent home from work and needs to see her pcp before she can go back. She says she has a cough and congestion. No fever. Please call her back at [phone]

## 2019-02-11 LAB — UPPER RESPIRATORY CULTURE

## 2019-02-23 ENCOUNTER — Inpatient Hospital Stay
Admit: 2019-02-23 | Discharge: 2019-02-23 | Disposition: A | Discharge status: Home / Self Care | Payer: PRIVATE HEALTH INSURANCE | Attending: Student in an Organized Health Care Education/Training Program

## 2019-02-23 ENCOUNTER — Emergency Department: Admit: 2019-02-23 | Payer: PRIVATE HEALTH INSURANCE | Primary: Family Medicine

## 2019-02-23 DIAGNOSIS — K5090 Crohn's disease, unspecified, without complications: Secondary | ICD-10-CM

## 2019-02-23 LAB — URINALYSIS W/ REFLEX CULTURE
Bilirubin, Urine: NEGATIVE
Bilirubin: NEGATIVE
Blood, Urine: NEGATIVE
Blood: NEGATIVE
Glucose, Ur: NEGATIVE mg/dL
Glucose: NEGATIVE mg/dL
Ketone: NEGATIVE mg/dL
Ketones, Urine: NEGATIVE mg/dL
Leukocyte Esterase, Urine: NEGATIVE
Leukocyte Esterase: NEGATIVE
Nitrite, Urine: NEGATIVE
Nitrites: NEGATIVE
Protein, UA: NEGATIVE mg/dL
Protein: NEGATIVE mg/dL
Specific Gravity, UA: 1.023 (ref 1.003–1.030)
Specific gravity: 1.023 (ref 1.003–1.030)
Urobilinogen, UA, POCT: 0.2 EU/dL (ref 0.2–1.0)
Urobilinogen: 0.2 EU/dL (ref 0.2–1.0)
pH (UA): 6.5 (ref 5.0–8.0)
pH, UA: 6.5 (ref 5.0–8.0)

## 2019-02-23 LAB — CBC WITH AUTO DIFFERENTIAL
Basophils %: 0 % (ref 0–1)
Basophils Absolute: 0 10*3/uL (ref 0.0–0.1)
Eosinophils %: 4 % (ref 0–7)
Eosinophils Absolute: 0.5 10*3/uL — ABNORMAL HIGH (ref 0.0–0.4)
Granulocyte Absolute Count: 0.1 10*3/uL — ABNORMAL HIGH (ref 0.00–0.04)
Hematocrit: 34 % — ABNORMAL LOW (ref 35.0–47.0)
Hemoglobin: 10.8 g/dL — ABNORMAL LOW (ref 11.5–16.0)
Immature Granulocytes: 1 % — ABNORMAL HIGH (ref 0.0–0.5)
Lymphocytes %: 11 % — ABNORMAL LOW (ref 12–49)
Lymphocytes Absolute: 1.6 10*3/uL (ref 0.8–3.5)
MCH: 24.2 PG — ABNORMAL LOW (ref 26.0–34.0)
MCHC: 31.8 g/dL (ref 30.0–36.5)
MCV: 76.2 FL — ABNORMAL LOW (ref 80.0–99.0)
MPV: 9.5 FL (ref 8.9–12.9)
Monocytes %: 6 % (ref 5–13)
Monocytes Absolute: 0.8 10*3/uL (ref 0.0–1.0)
NRBC Absolute: 0 10*3/uL (ref 0.00–0.01)
Neutrophils %: 78 % — ABNORMAL HIGH (ref 32–75)
Neutrophils Absolute: 10.9 10*3/uL — ABNORMAL HIGH (ref 1.8–8.0)
Nucleated RBCs: 0 PER 100 WBC
Platelets: 484 10*3/uL — ABNORMAL HIGH (ref 150–400)
RBC: 4.46 M/uL (ref 3.80–5.20)
RDW: 16.9 % — ABNORMAL HIGH (ref 11.5–14.5)
WBC: 14 10*3/uL — ABNORMAL HIGH (ref 3.6–11.0)

## 2019-02-23 LAB — COMPREHENSIVE METABOLIC PANEL
ALT: 18 U/L (ref 12–78)
AST: 11 U/L — ABNORMAL LOW (ref 15–37)
Albumin/Globulin Ratio: 0.8 — ABNORMAL LOW (ref 1.1–2.2)
Albumin: 3.6 g/dL (ref 3.5–5.0)
Alkaline Phosphatase: 117 U/L (ref 45–117)
Anion Gap: 4 mmol/L — ABNORMAL LOW (ref 5–15)
BUN: 5 MG/DL — ABNORMAL LOW (ref 6–20)
Bun/Cre Ratio: 7 — ABNORMAL LOW (ref 12–20)
CO2: 28 mmol/L (ref 21–32)
Calcium: 9 MG/DL (ref 8.5–10.1)
Chloride: 107 mmol/L (ref 97–108)
Creatinine: 0.73 MG/DL (ref 0.55–1.02)
EGFR IF NonAfrican American: >60 mL/min/{1.73_m2} (ref >60)
GFR African American: >60 mL/min/{1.73_m2} (ref >60)
Globulin: 4.4 g/dL — ABNORMAL HIGH (ref 2.0–4.0)
Glucose: 84 mg/dL (ref 65–100)
Potassium: 3.5 mmol/L (ref 3.5–5.1)
Sodium: 139 mmol/L (ref 136–145)
Total Bilirubin: 0.2 MG/DL (ref 0.2–1.0)
Total Protein: 8 g/dL (ref 6.4–8.2)

## 2019-02-23 LAB — C-REACTIVE PROTEIN: CRP: 2.31 mg/dL — ABNORMAL HIGH (ref 0.00–0.60)

## 2019-02-23 LAB — LIPASE
Lipase: 178 U/L (ref 73–393)
Lipase: 178 U/L (ref 73–393)

## 2019-02-23 LAB — SEDIMENTATION RATE: Sed Rate: 19 mm/hr (ref 0–20)

## 2019-02-23 LAB — HCG URINE, QL. - POC
HCG, Pregnancy, Urine, POC: NEGATIVE
Pregnancy test,urine (POC): NEGATIVE

## 2019-02-23 LAB — METABOLIC PANEL, COMPREHENSIVE
A-G Ratio: 0.8 — ABNORMAL LOW (ref 1.1–2.2)
ALT (SGPT): 18 U/L (ref 12–78)
AST (SGOT): 11 U/L — ABNORMAL LOW (ref 15–37)
Albumin: 3.6 g/dL (ref 3.5–5.0)
Alk. phosphatase: 117 U/L (ref 45–117)
Anion gap: 4 mmol/L — ABNORMAL LOW (ref 5–15)
BUN/Creatinine ratio: 7 — ABNORMAL LOW (ref 12–20)
BUN: 5 MG/DL — ABNORMAL LOW (ref 6–20)
Bilirubin, total: 0.2 MG/DL (ref 0.2–1.0)
CO2: 28 mmol/L (ref 21–32)
Calcium: 9 MG/DL (ref 8.5–10.1)
Chloride: 107 mmol/L (ref 97–108)
Creatinine: 0.73 MG/DL (ref 0.55–1.02)
GFR est AA: >60 mL/min/{1.73_m2} (ref >60)
GFR est non-AA: >60 mL/min/{1.73_m2} (ref >60)
Globulin: 4.4 g/dL — ABNORMAL HIGH (ref 2.0–4.0)
Glucose: 84 mg/dL (ref 65–100)
Potassium: 3.5 mmol/L (ref 3.5–5.1)
Protein, total: 8 g/dL (ref 6.4–8.2)
Sodium: 139 mmol/L (ref 136–145)

## 2019-02-23 LAB — CBC WITH AUTOMATED DIFF
ABS. BASOPHILS: 0 10*3/uL (ref 0.0–0.1)
ABS. EOSINOPHILS: 0.5 10*3/uL — ABNORMAL HIGH (ref 0.0–0.4)
ABS. IMM. GRANS.: 0.1 10*3/uL — ABNORMAL HIGH (ref 0.00–0.04)
ABS. LYMPHOCYTES: 1.6 10*3/uL (ref 0.8–3.5)
ABS. MONOCYTES: 0.8 10*3/uL (ref 0.0–1.0)
ABS. NEUTROPHILS: 10.9 10*3/uL — ABNORMAL HIGH (ref 1.8–8.0)
ABSOLUTE NRBC: 0 10*3/uL (ref 0.00–0.01)
BASOPHILS: 0 % (ref 0–1)
EOSINOPHILS: 4 % (ref 0–7)
HCT: 34 % — ABNORMAL LOW (ref 35.0–47.0)
HGB: 10.8 g/dL — ABNORMAL LOW (ref 11.5–16.0)
IMMATURE GRANULOCYTES: 1 % — ABNORMAL HIGH (ref 0.0–0.5)
LYMPHOCYTES: 11 % — ABNORMAL LOW (ref 12–49)
MCH: 24.2 PG — ABNORMAL LOW (ref 26.0–34.0)
MCHC: 31.8 g/dL (ref 30.0–36.5)
MCV: 76.2 FL — ABNORMAL LOW (ref 80.0–99.0)
MONOCYTES: 6 % (ref 5–13)
MPV: 9.5 FL (ref 8.9–12.9)
NEUTROPHILS: 78 % — ABNORMAL HIGH (ref 32–75)
NRBC: 0 PER 100 WBC
PLATELET: 484 10*3/uL — ABNORMAL HIGH (ref 150–400)
RBC: 4.46 M/uL (ref 3.80–5.20)
RDW: 16.9 % — ABNORMAL HIGH (ref 11.5–14.5)
WBC: 14 10*3/uL — ABNORMAL HIGH (ref 3.6–11.0)

## 2019-02-23 LAB — C REACTIVE PROTEIN, QT: C-Reactive protein: 2.31 mg/dL — ABNORMAL HIGH (ref 0.00–0.60)

## 2019-02-23 LAB — SAMPLES BEING HELD

## 2019-02-23 LAB — SED RATE (ESR): Sed rate, automated: 19 mm/hr (ref 0–20)

## 2019-02-23 MED ORDER — SODIUM CHLORIDE 0.9% BOLUS IV
0.9 % | Freq: Once | INTRAVENOUS | Status: AC
Start: 2019-02-23 — End: 2019-02-23
  Administered 2019-02-23: 21:00:00 via INTRAVENOUS

## 2019-02-23 MED ORDER — SODIUM CHLORIDE 0.9 % IJ SYRG
Freq: Three times a day (TID) | INTRAMUSCULAR | Status: DC
Start: 2019-02-23 — End: 2019-02-25
  Administered 2019-02-24 – 2019-02-25 (×5): via INTRAVENOUS

## 2019-02-23 MED ORDER — SODIUM CHLORIDE 0.9 % IJ SYRG
Freq: Once | INTRAMUSCULAR | Status: AC
Start: 2019-02-23 — End: 2019-02-23
  Administered 2019-02-23: 21:00:00 via INTRAVENOUS

## 2019-02-23 MED ORDER — LEVOFLOXACIN IN D5W 500 MG/100 ML IV PIGGY BACK
500 mg/100 mL | INTRAVENOUS | Status: AC
Start: 2019-02-23 — End: 2019-02-24
  Administered 2019-02-23: 22:00:00 via INTRAVENOUS

## 2019-02-23 MED ORDER — DICYCLOMINE 10 MG/ML IM SOLN
10 mg/mL | INTRAMUSCULAR | Status: AC
Start: 2019-02-23 — End: 2019-02-23
  Administered 2019-02-23: 21:00:00 via INTRAMUSCULAR

## 2019-02-23 MED ORDER — SODIUM CHLORIDE 0.9% BOLUS IV
0.9 % | Freq: Once | INTRAVENOUS | Status: AC
Start: 2019-02-23 — End: 2019-02-24
  Administered 2019-02-24: 02:00:00 via INTRAVENOUS

## 2019-02-23 MED ORDER — METRONIDAZOLE IN SODIUM CHLORIDE (ISO-OSM) 500 MG/100 ML IV PIGGY BACK
500 mg/100 mL | Freq: Two times a day (BID) | INTRAVENOUS | Status: DC
Start: 2019-02-23 — End: 2019-02-25
  Administered 2019-02-23 – 2019-02-25 (×4): via INTRAVENOUS

## 2019-02-23 MED ORDER — NICOTINE 21 MG/24 HR DAILY PATCH
21 mg/24 hr | Freq: Every day | TRANSDERMAL | Status: DC
Start: 2019-02-23 — End: 2019-02-25

## 2019-02-23 MED ORDER — METHYLPREDNISOLONE (PF) 40 MG/ML IJ SOLR
40 mg/mL | Freq: Three times a day (TID) | INTRAMUSCULAR | Status: DC
Start: 2019-02-23 — End: 2019-02-25
  Administered 2019-02-24 – 2019-02-25 (×5): via INTRAVENOUS

## 2019-02-23 MED ORDER — ONDANSETRON (PF) 4 MG/2 ML INJECTION
4 mg/2 mL | INTRAMUSCULAR | Status: DC | PRN
Start: 2019-02-23 — End: 2019-02-25

## 2019-02-23 MED ORDER — METHYLPREDNISOLONE (PF) 40 MG/ML IJ SOLR
40 mg/mL | INTRAMUSCULAR | Status: AC
Start: 2019-02-23 — End: 2019-02-23
  Administered 2019-02-23: 22:00:00 via INTRAVENOUS

## 2019-02-23 MED ORDER — SODIUM CHLORIDE 0.9 % IV
INTRAVENOUS | Status: DC
Start: 2019-02-23 — End: 2019-02-25
  Administered 2019-02-24 – 2019-02-25 (×2): via INTRAVENOUS

## 2019-02-23 MED ORDER — IOPAMIDOL 76 % IV SOLN
76 % | Freq: Once | INTRAVENOUS | Status: AC
Start: 2019-02-23 — End: 2019-02-23
  Administered 2019-02-23: 21:00:00 via INTRAVENOUS

## 2019-02-23 MED ORDER — DIPHENHYDRAMINE HCL 50 MG/ML IJ SOLN
50 mg/mL | INTRAMUSCULAR | Status: DC | PRN
Start: 2019-02-23 — End: 2019-02-25

## 2019-02-23 MED ORDER — ONDANSETRON (PF) 4 MG/2 ML INJECTION
4 mg/2 mL | INTRAMUSCULAR | Status: AC
Start: 2019-02-23 — End: 2019-02-23
  Administered 2019-02-23: 21:00:00 via INTRAVENOUS

## 2019-02-23 MED ORDER — SODIUM CHLORIDE 0.9 % IJ SYRG
INTRAMUSCULAR | Status: DC | PRN
Start: 2019-02-23 — End: 2019-02-25

## 2019-02-23 MED ORDER — LEVOFLOXACIN IN D5W 750 MG/150 ML IV PIGGY BACK
750 mg/150 mL | INTRAVENOUS | Status: DC
Start: 2019-02-23 — End: 2019-02-25
  Administered 2019-02-24: 22:00:00 via INTRAVENOUS

## 2019-02-23 MED ORDER — METRONIDAZOLE IN SODIUM CHLORIDE (ISO-OSM) 500 MG/100 ML IV PIGGY BACK
500 mg/100 mL | INTRAVENOUS | Status: DC
Start: 2019-02-23 — End: 2019-02-23
  Administered 2019-02-24: 03:00:00 via INTRAVENOUS

## 2019-02-23 MED ORDER — DEXTROSE 5% IN WATER (D5W) IV
50 mg/mL | INTRAVENOUS | Status: DC | PRN
Start: 2019-02-23 — End: 2019-02-23

## 2019-02-23 MED ORDER — FENTANYL CITRATE (PF) 50 MCG/ML IJ SOLN
50 mcg/mL | INTRAMUSCULAR | Status: DC | PRN
Start: 2019-02-23 — End: 2019-02-25
  Administered 2019-02-23: 23:00:00 via INTRAVENOUS

## 2019-02-23 MED ORDER — SODIUM CHLORIDE 0.9% BOLUS IV
0.9 % | Freq: Once | INTRAVENOUS | Status: AC
Start: 2019-02-23 — End: 2019-02-24
  Administered 2019-02-23: 21:00:00 via INTRAVENOUS

## 2019-02-23 MED FILL — SODIUM CHLORIDE 0.9 % IV: INTRAVENOUS | Qty: 1000

## 2019-02-23 MED FILL — SOLU-MEDROL (PF) 40 MG/ML SOLUTION FOR INJECTION: 40 mg/mL | INTRAMUSCULAR | Qty: 1

## 2019-02-23 MED FILL — ONDANSETRON (PF) 4 MG/2 ML INJECTION: 4 mg/2 mL | INTRAMUSCULAR | Qty: 2

## 2019-02-23 MED FILL — LEVOFLOXACIN IN D5W 500 MG/100 ML IV PIGGY BACK: 500 mg/100 mL | INTRAVENOUS | Qty: 100

## 2019-02-23 MED FILL — METRO I.V. 500 MG/100 ML INTRAVENOUS PIGGYBACK: 500 mg/100 mL | INTRAVENOUS | Qty: 100

## 2019-02-23 MED FILL — ISOVUE-370  76 % INTRAVENOUS SOLUTION: 370 mg iodine /mL (76 %) | INTRAVENOUS | Qty: 100

## 2019-02-23 MED FILL — FENTANYL CITRATE (PF) 50 MCG/ML IJ SOLN: 50 mcg/mL | INTRAMUSCULAR | Qty: 2

## 2019-02-23 MED FILL — NORMAL SALINE FLUSH 0.9 % INJECTION SYRINGE: INTRAMUSCULAR | Qty: 10

## 2019-02-23 MED FILL — BENTYL 10 MG/ML INTRAMUSCULAR SOLUTION: 10 mg/mL | INTRAMUSCULAR | Qty: 2

## 2019-02-23 MED FILL — SODIUM CHLORIDE 0.9 % IV: INTRAVENOUS | Qty: 100
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
## 2019-02-23 NOTE — ED Notes (Signed)
Patient to CT.

## 2019-02-23 NOTE — Progress Notes (Signed)
Pt. requesting ice chips with NPO order and having 8/10 pain in abd. RN paged hospitalist, Dr. Lum Babe, who ordered ice chips and Fentanyl 25 mcg one time dose. RN will administer and re-assess.

## 2019-02-23 NOTE — H&P (Signed)
[facility]  HISTORY AND PHYSICAL    Name:  Denise Huber, Denise Huber  MR#:  [id number]  DOB:  [birth year]  ACCOUNT #:  [id number]  ADMIT DATE:  02/23/2019      ADMITTING ATTENDING:  Teofilo Pod, MD    CHIEF COMPLAINT:  Abdominal pain.    HISTORY OF PRESENT ILLNESS:  This is a 33 year old African American female with a past medical history of Crohn's disease that was diagnosed last November, asthma, depression, sickle cell trait.  She comes over here because of abdominal pain that started since last two days.  She claims that since last November she has been having off and on abdominal pain, but since last two days she has been having consistent right lower quadrant abdominal pain, which was initially about 7/10 in intensity, sharp, stabbing, radiating to back.  She did not have any fever.  No nausea or vomiting.  She usually has four to five bowel movements a day, which has not changed since last 2 days.  In that same regard, she spoke to her gastroenterologist, who is Dr. Steva Ready, and she was asked to take 1000 mg of Tylenol four times daily, but despite that her pain got worse and she had to come into the ER.    REVIEW OF SYSTEMS:  Pertinent positives as above.  Rest of review of systems were done.  There were all negative except for above.    PAST MEDICAL HISTORY:  1.  Crohn's disease.  2.  Depression.  3.  Sickle cell trait.    PAST SURGERIES:  Spinal fusion in 2009.    FAMILY HISTORY:  Significant for cancer.    SOCIAL HISTORY:  The patient is a smoker.  She smokes about 5 cigarettes per day.  Nonalcoholic, non-drug abuser.    ALLERGIES:  THE PATIENT CLAIMS THAT SHE IS ALLERGIC TO LORTAB AND SHE BREAKS INTO HIVES (I SPOKE TO THE PHARMACIST AND SHE WAS ABLE TO CONFIRM THAT SHE HAD FILLED NORCO AS LATE AS JANUARY OF THIS YEAR).    HOME MEDICATIONS:  The patient is on following medication:  Tylenol 1000 mg p.o. q.6 hours. p.r.n.    PHYSICAL EXAMINATION:  VITAL SIGNS:  At the time when the patient was  seen, the patient's vitals were as follows:  Blood pressure 129/70, pulse 94, afebrile, respiratory rate 16, saturating 99% on room air.  GENERAL:  The patient seems to be in quite obvious distress with abdominal pain.  HEENT:  Head:  Normocephalic, atraumatic.  Eyes:  PERRLA.  EOMI. Ears:  Bilateral hearing normal.  No growth.  Nose:  No polyp or bleeding.  Mouth:  Dry mucous membranes.  Decent oral hygiene.  NECK:  Supple.  No JVD.  No thyromegaly.  RESPIRATORY:  Clear to auscultation bilaterally.  No adventitious breath sounds.  CARDIOVASCULAR:  S1 and S2 normal.  No murmurs, rubs, or gallops.  GI:  Bowel sounds present.  Soft.  Tenderness in the right lower quadrant, but no rebound, no guarding.  There is no tenderness on the left side of the abdomen.  NEUROLOGICAL:  Cranial nerves II-XII intact.  Motor 5/5 bilaterally in upper limbs and lower limbs.  Sensory normal.  PSYCHIATRIC:  Mood and affect appropriate.  Alert, awake, and oriented x3.    LABORATORY AND RADIOLOGICAL RESULTS:  WBC 14.0 hemoglobin 10.8, hematocrit 34, and platelet count of 484.  Urinalysis shows no signs of infection, although 2+ bacteria.  Sodium 139, potassium 3.5, chloride 107, bicarb 28, BUN  5, creatinine 0.73, ALT of 18, AST of 11.  C-reactive protein of 2.31.  CT scan of the abdomen was done, which shows inflammation and wall thickening of the terminal ileum, cecum, and proximal ascending colon, involvement of the appendix as well.  There is possible fistulous connection between the tip of the appendix and the terminal ileum.  No drainable fluid collection.    ASSESSMENT AND PLAN:  This is a 33 year old [place] American female with a past medical history of Crohn's disease since 09/2018.  She comes over here with worsening abdominal pain.    1.  Worsening abdominal pain, likely secondary to Crohn's exacerbation.  We will admit the patient as inpatient.  We will put the patient on IV pain medications, n.p.o., and we will start the  patient on IV steroids and Levaquin and Flagyl.  I have already spoken to Dr. Steva Ready, greatly appreciate his recommendations.  He has kindly agreed on following the patient tomorrow.  2.  Asymptomatic bacteriuria.  The patient does not need to be treated for that.  3.  Smoking.  I counseled the patient extensively against smoking.  The patient understands, especially in light of the Crohn's, and for now, I will put the patient on nicotine patch.    I spent about 45 minutes in taking care of this patient.        Teofilo Pod, MD      [id number]  D:  02/23/2019 18:47  T:  02/23/2019 20:08  JOB #:  [id number]

## 2019-02-23 NOTE — ED Notes (Signed)
Triage: Patient arrives ambulatory from home with c/o RLQ abdominal pain, constant in nature, for 3 days. Reports using tylenol without relief. Hx crohns dz. Denies melena, vomiting and nausea. + Chronic diarrhea.

## 2019-02-23 NOTE — Progress Notes (Signed)
[id number]  Abd pain  Teofilo Pod, MD

## 2019-02-23 NOTE — Progress Notes (Signed)
Clinical Pharmacy Note: Metronidazole Dosing    Please note that the metronidazole dose for Denise Huber has been changed to 500 mg IV q12h per [facility]-approved protocol.  Please contact the pharmacy with any questions.    Monia Pouch, PHARMD

## 2019-02-23 NOTE — Progress Notes (Signed)
[id number]  Abd pain  Denise Mergenthaler, MD

## 2019-02-23 NOTE — ED Triage Notes (Signed)
Triage: Patient arrives ambulatory from home with c/o RLQ abdominal pain, constant in nature, for 3 days. Reports using tylenol without relief. Hx crohns dz. Denies melena, vomiting and nausea. + Chronic diarrhea.

## 2019-02-23 NOTE — H&P (Signed)
[facility]  HISTORY AND PHYSICAL    Name:  Denise Huber, Denise Huber  MR#:  [id number]  DOB:  [birth year]  ACCOUNT #:  [id number]  ADMIT DATE:  02/23/2019      ADMITTING ATTENDING:  Teofilo Pod, MD    CHIEF COMPLAINT:  Abdominal pain.    HISTORY OF PRESENT ILLNESS:  This is a 33 year old African American female with a past medical history of Crohn's disease that was diagnosed last November, asthma, depression, sickle cell trait.  She comes over here because of abdominal pain that started since last two days.  She claims that since last November she has been having off and on abdominal pain, but since last two days she has been having consistent right lower quadrant abdominal pain, which was initially about 7/10 in intensity, sharp, stabbing, radiating to back.  She did not have any fever.  No nausea or vomiting.  She usually has four to five bowel movements a day, which has not changed since last 2 days.  In that same regard, she spoke to her gastroenterologist, who is Dr. Steva Ready, and she was asked to take 1000 mg of Tylenol four times daily, but despite that her pain got worse and she had to come into the ER.    REVIEW OF SYSTEMS:  Pertinent positives as above.  Rest of review of systems were done.  There were all negative except for above.    PAST MEDICAL HISTORY:  1.  Crohn's disease.  2.  Depression.  3.  Sickle cell trait.    PAST SURGERIES:  Spinal fusion in 2009.    FAMILY HISTORY:  Significant for cancer.    SOCIAL HISTORY:  The patient is a smoker.  She smokes about 5 cigarettes per day.  Nonalcoholic, non-drug abuser.    ALLERGIES:  THE PATIENT CLAIMS THAT SHE IS ALLERGIC TO LORTAB AND SHE BREAKS INTO HIVES (I SPOKE TO THE PHARMACIST AND SHE WAS ABLE TO CONFIRM THAT SHE HAD FILLED NORCO AS LATE AS JANUARY OF THIS YEAR).    HOME MEDICATIONS:  The patient is on following medication:  Tylenol 1000 mg p.o. q.6 hours. p.r.n.    PHYSICAL EXAMINATION:   VITAL SIGNS:  At the time when the patient was seen, the patient's vitals were as follows:  Blood pressure 129/70, pulse 94, afebrile, respiratory rate 16, saturating 99% on room air.  GENERAL:  The patient seems to be in quite obvious distress with abdominal pain.  HEENT:  Head:  Normocephalic, atraumatic.  Eyes:  PERRLA.  EOMI. Ears:  Bilateral hearing normal.  No growth.  Nose:  No polyp or bleeding.  Mouth:  Dry mucous membranes.  Decent oral hygiene.  NECK:  Supple.  No JVD.  No thyromegaly.  RESPIRATORY:  Clear to auscultation bilaterally.  No adventitious breath sounds.  CARDIOVASCULAR:  S1 and S2 normal.  No murmurs, rubs, or gallops.  GI:  Bowel sounds present.  Soft.  Tenderness in the right lower quadrant, but no rebound, no guarding.  There is no tenderness on the left side of the abdomen.  NEUROLOGICAL:  Cranial nerves II-XII intact.  Motor 5/5 bilaterally in upper limbs and lower limbs.  Sensory normal.  PSYCHIATRIC:  Mood and affect appropriate.  Alert, awake, and oriented x3.    LABORATORY AND RADIOLOGICAL RESULTS:  WBC 14.0 hemoglobin 10.8, hematocrit 34, and platelet count of 484.  Urinalysis shows no signs of infection, although 2+ bacteria.  Sodium 139, potassium 3.5, chloride 107, bicarb 28, BUN  5, creatinine 0.73, ALT of 18, AST of 11.  C-reactive protein of 2.31.  CT scan of the abdomen was done, which shows inflammation and wall thickening of the terminal ileum, cecum, and proximal ascending colon, involvement of the appendix as well.  There is possible fistulous connection between the tip of the appendix and the terminal ileum.  No drainable fluid collection.    ASSESSMENT AND PLAN:  This is a 33 year old [place] American female with a past medical history of Crohn's disease since 09/2018.  She comes over here with worsening abdominal pain.    1.  Worsening abdominal pain, likely secondary to Crohn's exacerbation.  We will admit the patient as inpatient.  We will put the patient on IV  pain medications, n.p.o., and we will start the patient on IV steroids and Levaquin and Flagyl.  I have already spoken to Dr. Steva Ready, greatly appreciate his recommendations.  He has kindly agreed on following the patient tomorrow.  2.  Asymptomatic bacteriuria.  The patient does not need to be treated for that.  3.  Smoking.  I counseled the patient extensively against smoking.  The patient understands, especially in light of the Crohn's, and for now, I will put the patient on nicotine patch.    I spent about 45 minutes in taking care of this patient.        Teofilo Pod, MD      [id number]  D:  02/23/2019 18:47  T:  02/23/2019 20:08  JOB #:  [id number]

## 2019-02-23 NOTE — Other (Signed)
TRANSFER - OUT REPORT:    Verbal report given to [facility] RN(name) on DUTCHESS IGNACIO  being transferred to 5 east(unit) for routine progression of care       Report consisted of patient???s Situation, Background, Assessment and   Recommendations(SBAR).     Information from the following report(s) SBAR, ED Summary, [facility] and Recent Results was reviewed with the receiving nurse.    Lines:   Peripheral IV 02/23/19 Right Antecubital (Active)        Opportunity for questions and clarification was provided.      Patient transported with:   [employer]
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
## 2019-02-23 NOTE — Progress Notes (Signed)
Clinical Pharmacy Note: Metronidazole Dosing    Please note that the metronidazole dose for Denise Huber has been changed to 500 mg IV q12h per [facility]-approved protocol.  Please contact the pharmacy with any questions.    Andrea Pierce, PHARMD

## 2019-02-23 NOTE — Progress Notes (Signed)
Pt. requesting ice chips with NPO order and having 8/10 pain in abd. RN paged hospitalist, Dr. Eniola, who ordered ice chips and Fentanyl 25 mcg one time dose. RN will administer and re-assess.

## 2019-02-24 LAB — CBC WITH AUTO DIFFERENTIAL
Basophils %: 0 % (ref 0–1)
Basophils Absolute: 0 10*3/uL (ref 0.0–0.1)
Eosinophils %: 0 % (ref 0–7)
Eosinophils Absolute: 0 10*3/uL (ref 0.0–0.4)
Granulocyte Absolute Count: 0 10*3/uL (ref 0.00–0.04)
Hematocrit: 31.1 % — ABNORMAL LOW (ref 35.0–47.0)
Hemoglobin: 10 g/dL — ABNORMAL LOW (ref 11.5–16.0)
Immature Granulocytes: 0 % (ref 0.0–0.5)
Lymphocytes %: 5 % — ABNORMAL LOW (ref 12–49)
Lymphocytes Absolute: 0.7 10*3/uL — ABNORMAL LOW (ref 0.8–3.5)
MCH: 24.2 PG — ABNORMAL LOW (ref 26.0–34.0)
MCHC: 32.2 g/dL (ref 30.0–36.5)
MCV: 75.3 FL — ABNORMAL LOW (ref 80.0–99.0)
MPV: 9.4 FL (ref 8.9–12.9)
Monocytes %: 1 % — ABNORMAL LOW (ref 5–13)
Monocytes Absolute: 0.1 10*3/uL (ref 0.0–1.0)
NRBC Absolute: 0 10*3/uL (ref 0.00–0.01)
Neutrophils %: 94 % — ABNORMAL HIGH (ref 32–75)
Neutrophils Absolute: 12.9 10*3/uL — ABNORMAL HIGH (ref 1.8–8.0)
Nucleated RBCs: 0 PER 100 WBC
Platelets: 410 10*3/uL — ABNORMAL HIGH (ref 150–400)
RBC: 4.13 M/uL (ref 3.80–5.20)
RDW: 16.5 % — ABNORMAL HIGH (ref 11.5–14.5)
WBC: 13.7 10*3/uL — ABNORMAL HIGH (ref 3.6–11.0)

## 2019-02-24 LAB — COMPREHENSIVE METABOLIC PANEL
ALT: 15 U/L (ref 12–78)
AST: 8 U/L — ABNORMAL LOW (ref 15–37)
Albumin/Globulin Ratio: 0.8 — ABNORMAL LOW (ref 1.1–2.2)
Albumin: 3 g/dL — ABNORMAL LOW (ref 3.5–5.0)
Alkaline Phosphatase: 104 U/L (ref 45–117)
Anion Gap: 7 mmol/L (ref 5–15)
BUN: 4 MG/DL — ABNORMAL LOW (ref 6–20)
Bun/Cre Ratio: 7 — ABNORMAL LOW (ref 12–20)
CO2: 23 mmol/L (ref 21–32)
Calcium: 8.4 MG/DL — ABNORMAL LOW (ref 8.5–10.1)
Chloride: 110 mmol/L — ABNORMAL HIGH (ref 97–108)
Creatinine: 0.56 MG/DL (ref 0.55–1.02)
EGFR IF NonAfrican American: >60 mL/min/{1.73_m2} (ref >60)
GFR African American: >60 mL/min/{1.73_m2} (ref >60)
Globulin: 4 g/dL (ref 2.0–4.0)
Glucose: 134 mg/dL — ABNORMAL HIGH (ref 65–100)
Potassium: 3.8 mmol/L (ref 3.5–5.1)
Sodium: 140 mmol/L (ref 136–145)
Total Bilirubin: 0.2 MG/DL (ref 0.2–1.0)
Total Protein: 7 g/dL (ref 6.4–8.2)

## 2019-02-24 LAB — CBC WITH AUTOMATED DIFF
ABS. BASOPHILS: 0 10*3/uL (ref 0.0–0.1)
ABS. EOSINOPHILS: 0 10*3/uL (ref 0.0–0.4)
ABS. IMM. GRANS.: 0 10*3/uL (ref 0.00–0.04)
ABS. LYMPHOCYTES: 0.7 10*3/uL — ABNORMAL LOW (ref 0.8–3.5)
ABS. MONOCYTES: 0.1 10*3/uL (ref 0.0–1.0)
ABS. NEUTROPHILS: 12.9 10*3/uL — ABNORMAL HIGH (ref 1.8–8.0)
ABSOLUTE NRBC: 0 10*3/uL (ref 0.00–0.01)
BASOPHILS: 0 % (ref 0–1)
EOSINOPHILS: 0 % (ref 0–7)
HCT: 31.1 % — ABNORMAL LOW (ref 35.0–47.0)
HGB: 10 g/dL — ABNORMAL LOW (ref 11.5–16.0)
IMMATURE GRANULOCYTES: 0 % (ref 0.0–0.5)
LYMPHOCYTES: 5 % — ABNORMAL LOW (ref 12–49)
MCH: 24.2 PG — ABNORMAL LOW (ref 26.0–34.0)
MCHC: 32.2 g/dL (ref 30.0–36.5)
MCV: 75.3 FL — ABNORMAL LOW (ref 80.0–99.0)
MONOCYTES: 1 % — ABNORMAL LOW (ref 5–13)
MPV: 9.4 FL (ref 8.9–12.9)
NEUTROPHILS: 94 % — ABNORMAL HIGH (ref 32–75)
NRBC: 0 PER 100 WBC
PLATELET: 410 10*3/uL — ABNORMAL HIGH (ref 150–400)
RBC: 4.13 M/uL (ref 3.80–5.20)
RDW: 16.5 % — ABNORMAL HIGH (ref 11.5–14.5)
WBC: 13.7 10*3/uL — ABNORMAL HIGH (ref 3.6–11.0)

## 2019-02-24 LAB — METABOLIC PANEL, COMPREHENSIVE
A-G Ratio: 0.8 — ABNORMAL LOW (ref 1.1–2.2)
ALT (SGPT): 15 U/L (ref 12–78)
AST (SGOT): 8 U/L — ABNORMAL LOW (ref 15–37)
Albumin: 3 g/dL — ABNORMAL LOW (ref 3.5–5.0)
Alk. phosphatase: 104 U/L (ref 45–117)
Anion gap: 7 mmol/L (ref 5–15)
BUN/Creatinine ratio: 7 — ABNORMAL LOW (ref 12–20)
BUN: 4 MG/DL — ABNORMAL LOW (ref 6–20)
Bilirubin, total: 0.2 MG/DL (ref 0.2–1.0)
CO2: 23 mmol/L (ref 21–32)
Calcium: 8.4 MG/DL — ABNORMAL LOW (ref 8.5–10.1)
Chloride: 110 mmol/L — ABNORMAL HIGH (ref 97–108)
Creatinine: 0.56 MG/DL (ref 0.55–1.02)
GFR est AA: >60 mL/min/{1.73_m2} (ref >60)
GFR est non-AA: >60 mL/min/{1.73_m2} (ref >60)
Globulin: 4 g/dL (ref 2.0–4.0)
Glucose: 134 mg/dL — ABNORMAL HIGH (ref 65–100)
Potassium: 3.8 mmol/L (ref 3.5–5.1)
Protein, total: 7 g/dL (ref 6.4–8.2)
Sodium: 140 mmol/L (ref 136–145)

## 2019-02-24 MED ORDER — FENTANYL CITRATE (PF) 50 MCG/ML IJ SOLN
50 mcg/mL | Freq: Once | INTRAMUSCULAR | Status: AC
Start: 2019-02-24 — End: 2019-02-23
  Administered 2019-02-24: 02:00:00 via INTRAVENOUS

## 2019-02-24 MED FILL — SOLU-MEDROL (PF) 40 MG/ML SOLUTION FOR INJECTION: 40 mg/mL | INTRAMUSCULAR | Qty: 1

## 2019-02-24 MED FILL — NORMAL SALINE FLUSH 0.9 % INJECTION SYRINGE: INTRAMUSCULAR | Qty: 10

## 2019-02-24 MED FILL — METRO I.V. 500 MG/100 ML INTRAVENOUS PIGGYBACK: 500 mg/100 mL | INTRAVENOUS | Qty: 100

## 2019-02-24 MED FILL — LEVOFLOXACIN IN D5W 750 MG/150 ML IV PIGGY BACK: 750 mg/150 mL | INTRAVENOUS | Qty: 150

## 2019-02-24 MED FILL — FENTANYL CITRATE (PF) 50 MCG/ML IJ SOLN: 50 mcg/mL | INTRAMUSCULAR | Qty: 2

## 2019-02-24 NOTE — Progress Notes (Signed)
Bedside shift change report given to Rachel, RN (oncoming nurse) by Marc, RN (offgoing nurse). Report included the following information SBAR, Kardex, Intake/Output and MAR.

## 2019-02-24 NOTE — Progress Notes (Signed)
Progress  Notes by Vanessa Kick, MD at 02/24/19 1448                Author: Vanessa Kick, MD  Service: Internal Medicine  Author Type: Physician       Filed: 02/24/19 1453  Date of Service: 02/24/19 1448  Status: Signed          Editor: Vanessa Kick, MD (Physician)                    [facility]                                                                                              Hospitalist Progress Note   Vanessa Kick, MD   Answering service: [phone] OR [phone] from in house phone            Date of Service:  02/24/2019   NAME:  Denise Huber   DOB:  01-Jun-1986   MRN:  [id number]           Admission Summary:           This is a 32 year old African American female with a past medical history of Crohn's disease that was diagnosed last November, asthma, depression, sickle cell trait.  She comes over here because of abdominal pain  that started since last two days.  She claims that since last November she has been having off and on abdominal pain, but since last two days she has been having consistent right lower quadrant abdominal pain, which was initially about 7/10 in intensity,  sharp, stabbing, radiating to back.  She did not have any fever.  No nausea or vomiting.  She usually has four to five bowel movements a day, which has not changed since last 2 days.  In that same regard, she spoke to her gastroenterologist, who is  Dr. Steva Ready, and she was asked to take 1000 mg of Tylenol four times daily, but despite that her pain got worse and she had to come into the ER.   ??   Interval history / Subjective:        Doing really good today.  No issues.  She said her diarrhea and abdominal pain is much better on antibiotics and steroids.  Discussed case with the GI doctor.  Is back with a headache          Assessment & Plan:             Abdominal pain.   History of Crohn's disease.  And presence of fistula.  Started on antibiotics and IV steroids.  GI on board.  Patient  is feeling much better today start on clear liquids.      Asymptomatic bacteriuria    Follow-up urine culture.      Smoking   Cessation counseling      Sickle cell trait         Code status:    DVT prophylaxis:       Care Plan discussed with: Patient/Family   Anticipated Disposition: Home w/Family   Anticipated Discharge:  Less than 24 hours           Hospital Problems   Date Reviewed:  02/23/2019                         Codes  Class  Noted  POA              * (Principal) Abdominal pain  ICD-10-CM: R10.9   ICD-9-CM: 789.00    02/23/2019  Yes                               Review of Systems:     A comprehensive review of systems was negative except for that written in the HPI.            Vital Signs:      Last 24hrs VS reviewed since prior progress note. Most recent are:   Visit Vitals      BP  115/67     Pulse  81     Temp  97.7 ??F (36.5 ??C)     Resp  18     SpO2  97%        Breastfeeding  No           No intake or output data in the 24 hours ending 02/24/19 1449         Physical Examination:                     Constitutional:   No acute distress, cooperative, pleasant      ENT:   Oral mucosa moist, oropharynx benign.      Resp:   CTA bilaterally. No wheezing/rhonchi/rales. No accessory muscle use     CV:   Regular rhythm, normal rate, no murmurs, gallops, rubs         GI:   Soft, mild tenderness         Musculoskeletal:   No edema, warm, 2+ pulses throughout         Neurologic:   Moves all extremities.  AAOx3, CN II-XII reviewed                      Data Review:      Review and/or order of clinical lab test           Labs:          Recent Labs            02/24/19   0423  02/23/19   1619     WBC  13.7*  14.0*     HGB  10.0*  10.8*     HCT  31.1*  34.0*         PLT  410*  484*          Recent Labs            02/24/19   0423  02/23/19   1619     NA  140  139     K  3.8  3.5     CL  110*  107     CO2  23  28     BUN  4*  5*     CREA  0.56  0.73     GLU  134*  84         CA  8.4*  9.0  Recent Labs             02/24/19   0423  02/23/19   1619     SGOT  8*  11*     ALT  15  18     AP  104  117     TBILI  0.2  0.2     TP  7.0  8.0     ALB  3.0*  3.6     GLOB  4.0  4.4*         LPSE   --   178        No results for input(s): INR, PTP, APTT, INREXT in the last 72 hours.    No results for input(s): FE, TIBC, PSAT, FERR in the last 72 hours.    No results found for: FOL, RBCF    No results for input(s): PH, PCO2, PO2 in the last 72 hours.   No results for input(s): CPK, CKNDX, TROIQ in the last 72 hours.      No lab exists for component: CPKMB   No results found for: CHOL, CHOLX, CHLST, CHOLV, HDL, HDLP, LDL, LDLC, DLDLP, TGLX, TRIGL, TRIGP, CHHD, CHHDX   No results found for: [facility]     Lab Results         Component  Value  Date/Time            Color  YELLOW/STRAW  02/23/2019 04:37 PM       Appearance  CLOUDY (A)  02/23/2019 04:37 PM       Specific gravity  1.023  02/23/2019 04:37 PM       Specific gravity  1.015  10/07/2018 11:19 AM       pH (UA)  6.5  02/23/2019 04:37 PM       Protein  NEGATIVE   02/23/2019 04:37 PM       Glucose  NEGATIVE   02/23/2019 04:37 PM       Ketone  NEGATIVE   02/23/2019 04:37 PM       Bilirubin  NEGATIVE   02/23/2019 04:37 PM       Urobilinogen  0.2  02/23/2019 04:37 PM       Nitrites  NEGATIVE   02/23/2019 04:37 PM       Leukocyte Esterase  NEGATIVE   02/23/2019 04:37 PM       Epithelial cells  MANY (A)  02/23/2019 04:37 PM       Bacteria  2+ (A)  02/23/2019 04:37 PM       WBC  0-4  02/23/2019 04:37 PM            RBC  0-5  02/23/2019 04:37 PM                Medications Reviewed:          Current Facility-Administered Medications          Medication  Dose  Route  Frequency           ?  sodium chloride (NS) flush 5-40 mL   5-40 mL  IntraVENous  Q8H     ?  sodium chloride (NS) flush 5-40 mL   5-40 mL  IntraVENous  PRN     ?  0.9% sodium chloride infusion   125 mL/hr  IntraVENous  CONTINUOUS     ?  ondansetron (ZOFRAN) injection 4 mg   4 mg  IntraVENous  Q4H PRN     ?  methylPREDNISolone (  PF)  (SOLU-MEDROL) injection 40 mg   40 mg  IntraVENous  Q8H     ?  levoFLOXacin (LEVAQUIN) 750 mg in D5W IVPB   750 mg  IntraVENous  Q24H     ?  metroNIDAZOLE (FLAGYL) IVPB premix 500 mg   500 mg  IntraVENous  Q12H     ?  fentaNYL citrate (PF) injection 25 mcg   25 mcg  IntraVENous  Q4H PRN     ?  nicotine (NICODERM CQ) 21 mg/24 hr patch 1 Patch   1 Patch  TransDERmal  DAILY           ?  diphenhydrAMINE (BENADRYL) injection 12.5 mg   12.5 mg  IntraVENous  Q4H PRN        ______________________________________________________________________   EXPECTED LENGTH OF STAY: - - -   ACTUAL LENGTH OF STAY:          1                    Vanessa Kick, MD

## 2019-02-24 NOTE — Progress Notes (Signed)
[facility]                                                                                          Hospitalist Progress Note  Vanessa Kick, MD  Answering service: [phone] OR [phone] from in house phone        Date of Service:  02/24/2019  NAME:  Denise Huber  DOB:  [birth year]  MRN:  [id number]      Admission Summary:     This is a 33 year old African American female with a past medical history of Crohn's disease that was diagnosed last November, asthma, depression, sickle cell trait.  She comes over here because of abdominal pain that started since last two days.  She claims that since last November she has been having off and on abdominal pain, but since last two days she has been having consistent right lower quadrant abdominal pain, which was initially about 7/10 in intensity, sharp, stabbing, radiating to back.  She did not have any fever.  No nausea or vomiting.  She usually has four to five bowel movements a day, which has not changed since last 2 days.  In that same regard, she spoke to her gastroenterologist, who is Dr. Steva Ready, and she was asked to take 1000 mg of Tylenol four times daily, but despite that her pain got worse and she had to come into the ER.  ??  Interval history / Subjective:   Doing really good today.  No issues.  She said her diarrhea and abdominal pain is much better on antibiotics and steroids.  Discussed case with the GI doctor.  Is back with a headache     Assessment & Plan:       Abdominal pain.  History of Crohn's disease.  And presence of fistula.  Started on antibiotics and IV steroids.  GI on board.  Patient is feeling much better today start on clear liquids.    Asymptomatic bacteriuria   Follow-up urine culture.    Smoking  Cessation counseling    Sickle cell trait      Code status:   DVT prophylaxis:     Care Plan discussed with: Patient/Family  Anticipated Disposition: Home w/Family  Anticipated Discharge: Less than 24 hours      Hospital Problems  Date Reviewed: 03-20-2019          Codes Class Noted POA    * (Principal) Abdominal pain ICD-10-CM: R10.9  ICD-9-CM: 789.00  03/20/19 Yes                Review of Systems:   A comprehensive review of systems was negative except for that written in the HPI.       Vital Signs:    Last 24hrs VS reviewed since prior progress note. Most recent are:  Visit Vitals  BP 115/67   Pulse 81   Temp 97.7 ??F (36.5 ??C)   Resp 18   SpO2 97%   Breastfeeding No       No intake or output data in the 24 hours ending 02/24/19 1449     Physical  Examination:             Constitutional:  No acute distress, cooperative, pleasant    ENT:  Oral mucosa moist, oropharynx benign.    Resp:  CTA bilaterally. No wheezing/rhonchi/rales. No accessory muscle use   CV:  Regular rhythm, normal rate, no murmurs, gallops, rubs    GI:  Soft, mild tenderness    Musculoskeletal:  No edema, warm, 2+ pulses throughout    Neurologic:  Moves all extremities.  AAOx3, CN II-XII reviewed            Data Review:    Review and/or order of clinical lab test      Labs:     Recent Labs     02/24/19  0423 02/23/19  1619   WBC 13.7* 14.0*   HGB 10.0* 10.8*   HCT 31.1* 34.0*   PLT 410* 484*     Recent Labs     02/24/19  0423 02/23/19  1619   NA 140 139   K 3.8 3.5   CL 110* 107   CO2 23 28   BUN 4* 5*   CREA 0.56 0.73   GLU 134* 84   CA 8.4* 9.0     Recent Labs     02/24/19  0423 02/23/19  1619   SGOT 8* 11*   ALT 15 18   AP 104 117   TBILI 0.2 0.2   TP 7.0 8.0   ALB 3.0* 3.6   GLOB 4.0 4.4*   LPSE  --  178     No results for input(s): INR, PTP, APTT, INREXT in the last 72 hours.   No results for input(s): FE, TIBC, PSAT, FERR in the last 72 hours.   No results found for: FOL, RBCF   No results for input(s): PH, PCO2, PO2 in the last 72 hours.  No results for input(s): CPK, CKNDX, TROIQ in the last 72 hours.    No lab exists for component: CPKMB  No results found for: CHOL, CHOLX, CHLST, CHOLV, HDL, HDLP, LDL, LDLC, DLDLP, TGLX, TRIGL, TRIGP, CHHD, CHHDX   No results found for: [facility]  Lab Results   Component Value Date/Time    Color YELLOW/STRAW 02/23/2019 04:37 PM    Appearance CLOUDY (A) 02/23/2019 04:37 PM    Specific gravity 1.023 02/23/2019 04:37 PM    Specific gravity 1.015 10/07/2018 11:19 AM    pH (UA) 6.5 02/23/2019 04:37 PM    Protein NEGATIVE  02/23/2019 04:37 PM    Glucose NEGATIVE  02/23/2019 04:37 PM    Ketone NEGATIVE  02/23/2019 04:37 PM    Bilirubin NEGATIVE  02/23/2019 04:37 PM    Urobilinogen 0.2 02/23/2019 04:37 PM    Nitrites NEGATIVE  02/23/2019 04:37 PM    Leukocyte Esterase NEGATIVE  02/23/2019 04:37 PM    Epithelial cells MANY (A) 02/23/2019 04:37 PM    Bacteria 2+ (A) 02/23/2019 04:37 PM    WBC 0-4 02/23/2019 04:37 PM    RBC 0-5 02/23/2019 04:37 PM         Medications Reviewed:     Current Facility-Administered Medications   Medication Dose Route Frequency   ??? sodium chloride (NS) flush 5-40 mL  5-40 mL IntraVENous Q8H   ??? sodium chloride (NS) flush 5-40 mL  5-40 mL IntraVENous PRN   ??? 0.9% sodium chloride infusion  125 mL/hr IntraVENous CONTINUOUS   ??? ondansetron (ZOFRAN) injection 4 mg  4 mg IntraVENous Q4H PRN   ??? methylPREDNISolone (PF) (  SOLU-MEDROL) injection 40 mg  40 mg IntraVENous Q8H   ??? levoFLOXacin (LEVAQUIN) 750 mg in D5W IVPB  750 mg IntraVENous Q24H   ??? metroNIDAZOLE (FLAGYL) IVPB premix 500 mg  500 mg IntraVENous Q12H   ??? fentaNYL citrate (PF) injection 25 mcg  25 mcg IntraVENous Q4H PRN   ??? nicotine (NICODERM CQ) 21 mg/24 hr patch 1 Patch  1 Patch TransDERmal DAILY   ??? diphenhydrAMINE (BENADRYL) injection 12.5 mg  12.5 mg IntraVENous Q4H PRN     ______________________________________________________________________  EXPECTED LENGTH OF STAY: - - -  ACTUAL LENGTH OF STAY:          1                 Vanessa Kick, MD

## 2019-02-25 LAB — CULTURE, URINE
Colonies Counted: 13000
Colony Count: 13000

## 2019-02-25 MED ORDER — LEVOFLOXACIN 500 MG TAB
500 mg | ORAL_TABLET | Freq: Every day | ORAL | 0 refills | Status: DC
Start: 2019-02-25 — End: 2019-02-25

## 2019-02-25 MED ORDER — METHYLPREDNISOLONE (PF) 40 MG/ML IJ SOLR
40 mg/mL | Freq: Two times a day (BID) | INTRAMUSCULAR | Status: DC
Start: 2019-02-25 — End: 2019-02-25

## 2019-02-25 MED ORDER — METRONIDAZOLE 500 MG TAB
500 mg | ORAL_TABLET | Freq: Three times a day (TID) | ORAL | 0 refills | Status: AC
Start: 2019-02-25 — End: 2019-03-04

## 2019-02-25 MED ORDER — ACETAMINOPHEN-CODEINE 300 MG-30 MG TAB
300-30 mg | ORAL_TABLET | ORAL | 0 refills | Status: DC | PRN
Start: 2019-02-25 — End: 2019-02-25

## 2019-02-25 MED ORDER — PREDNISONE 10 MG TAB
10 mg | ORAL_TABLET | ORAL | 0 refills | Status: DC
Start: 2019-02-25 — End: 2021-01-28

## 2019-02-25 MED ORDER — ACETAMINOPHEN-CODEINE 300 MG-30 MG TAB
300-30 mg | ORAL_TABLET | ORAL | 0 refills | Status: AC | PRN
Start: 2019-02-25 — End: 2019-02-28

## 2019-02-25 MED ORDER — LEVOFLOXACIN 500 MG TAB
500 mg | ORAL_TABLET | Freq: Every day | ORAL | 0 refills | Status: AC
Start: 2019-02-25 — End: 2019-03-04

## 2019-02-25 MED ORDER — PREDNISONE 10 MG TAB
10 mg | ORAL_TABLET | ORAL | 0 refills | Status: DC
Start: 2019-02-25 — End: 2019-02-25

## 2019-02-25 MED ORDER — METRONIDAZOLE 500 MG TAB
500 mg | ORAL_TABLET | Freq: Three times a day (TID) | ORAL | 0 refills | Status: DC
Start: 2019-02-25 — End: 2019-02-25

## 2019-02-25 MED FILL — NORMAL SALINE FLUSH 0.9 % INJECTION SYRINGE: INTRAMUSCULAR | Qty: 10

## 2019-02-25 MED FILL — SOLU-MEDROL (PF) 40 MG/ML SOLUTION FOR INJECTION: 40 mg/mL | INTRAMUSCULAR | Qty: 1

## 2019-02-25 MED FILL — METRO I.V. 500 MG/100 ML INTRAVENOUS PIGGYBACK: 500 mg/100 mL | INTRAVENOUS | Qty: 100
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
## 2019-02-25 NOTE — Discharge Summary (Signed)
Discharge Summary by Vanessa Kick, MD at 02/25/19 1204                Author: Vanessa Kick, MD  Service: Internal Medicine  Author Type: Physician       Filed: 02/25/19 1205  Date of Service: 02/25/19 1204  Status: Signed          Editor: Vanessa Kick, MD (Physician)                       Discharge Summary           PATIENT ID: Denise Huber   MRN: [id number]    DATE OF BIRTH: 02-Aug-1986     DATE OF ADMISSION: 02/23/2019  4:03 PM     DATE OF DISCHARGE: 02/25/2019     PRIMARY CARE PROVIDER: Dietrich Pates, DO        ATTENDING PHYSICIAN:    DISCHARGING PROVIDER: Vanessa Kick, MD     To contact this individual call [phone] and ask the operator to page.  If unavailable ask to be transferred the Adult Hospitalist Department.      CONSULTATIONS: IP CONSULT TO HOSPITALIST   IP CONSULT TO GASTROENTEROLOGY      PROCEDURES/SURGERIES: * No surgery found *      ADMITTING DIAGNOSES & HOSPITAL COURSE:       This is a 33 year old African American female with a past medical history of Crohn's disease that was diagnosed last November, asthma, depression, sickle cell trait. ??She  comes over here because of abdominal pain that started since last two days. ??She claims that since last November she has been having off and on abdominal pain, but since last two days she has been having consistent right lower quadrant abdominal pain,  which was initially about 7/10 in intensity, sharp, stabbing, radiating to back. ??She did not have any fever. ??No nausea or vomiting. ??She usually has four to five bowel movements a day, which has not changed since last 2 days. ??In that same  regard, she spoke to her gastroenterologist, who is Dr. Steva Ready, and she was asked to take 1000 mg of Tylenol four times daily, but despite that her pain got worse and she had to come into the ER.      Abdominal pain.   History of Crohn's disease.  And presence of fistula.  Started on antibiotics and IV steroids.  GI on board.  Patient is feeling much better  today , change to solid diet and tolerated and discharged on oral antibiotics and steroids    ??   Asymptomatic bacteriuria    Follow-up urine culture.   ??   Smoking   Cessation counseling   ??   Sickle cell trait        DISCHARGE DIAGNOSES / PLAN:           Above          ADDITIONAL CARE RECOMMENDATIONS:        followup    PENDING TEST RESULTS:    At the time of discharge the following test results are still pending:       FOLLOW UP APPOINTMENTS:       Follow-up Information               Follow up With  Specialties  Details  Why  Contact Info              Lobb, Romie Jumper, DO  Family Practice  In 1  week    [address] [place] [address]   [phone]                       DIET: Regular Diet   Oral Nutritional Supplements:      ACTIVITY: Activity as tolerated      WOUND CARE:       EQUIPMENT needed:          DISCHARGE MEDICATIONS:     Current Discharge Medication List              START taking these medications          Details        metroNIDAZOLE (Flagyl) 500 mg tablet  Take 1 Tab by mouth three (3) times daily for 7 days.   Qty: 21 Tab, Refills:  0               levoFLOXacin (Levaquin) 500 mg tablet  Take 1 Tab by mouth daily for 7 days.   Qty: 7 Tab, Refills:  0               predniSONE (DELTASONE) 10 mg tablet  Take 4 tablets daily oral for 3 days and then    Take 3 tabs daily oral for 3 days and then    Take 2 tablets daily for 3 days and then    Take 1 tablet daily for 3 days and stop   Qty: 30 Tab, Refills:  0                            NOTIFY YOUR PHYSICIAN FOR ANY OF THE FOLLOWING:    Fever over 101 degrees for 24 hours.    Chest pain, shortness of breath, fever, chills, nausea, vomiting, diarrhea, change in mentation, falling, weakness, bleeding. Severe pain or pain not relieved by medications.   Or, any other signs or symptoms that you may have questions about.      DISPOSITION:      x   Home With:     OT    PT    HH    RN                   Long term SNF/Inpatient Rehab        Independent/assisted living          Hospice          Other:           PATIENT CONDITION AT DISCHARGE:       Functional status         Poor        Deconditioned           Independent         Cognition        xx  Lucid        Forgetful           Dementia         Catheters/lines (plus indication)         Foley        PICC        PEG         x  None         Code status       x   Full code  DNR         PHYSICAL EXAMINATION AT DISCHARGE:   General:          Alert, cooperative, no distress, appears stated age.      HEENT:           Atraumatic, anicteric sclerae, pink conjunctivae                           No oral ulcers, mucosa moist, throat clear, dentition fair   Neck:               Supple, symmetrical   Lungs:             Clear to auscultation bilaterally.  No Wheezing or Rhonchi. No rales.   Chest wall:      No tenderness  No Accessory muscle use.   Heart:              Regular  rhythm,  No  murmur   No edema   Abdomen:        Soft, non-tender. Not distended.  Bowel sounds normal   Extremities:     No cyanosis.  No clubbing,                             Skin turgor normal, Capillary refill normal   Skin:                Not pale.  Not Jaundiced  No rashes    Psych:             Not anxious or agitated.   Neurologic:      Alert, moves all extremities, answers questions appropriately and responds to commands          CHRONIC MEDICAL DIAGNOSES:      Problem List as of 02/25/2019  Date Reviewed:  03-20-19                        Codes  Class  Noted - Resolved             * (Principal) Abdominal pain  ICD-10-CM: R10.9   ICD-9-CM: 789.00    03/20/19 - Present                       Crohn's disease with complication (HCC)  ICD-10-CM: K50.919   ICD-9-CM: 555.9    12/14/2018 - Present                       Insomnia  ICD-10-CM: G47.00   ICD-9-CM: 780.52    03/13/2013 - Present                       Pap smear, as part of routine gynecological examination  ICD-10-CM: Z01.419   ICD-9-CM: V76.2    03/24/2011 - Present           Overview Signed 10/31/2014  9:24 AM by Christene Slates, LPN            Dr. Carlye Grippe [facility]                                   Depression  ICD-10-CM: Z61.0   ICD-9-CM: 311    12/25/2008 - Present  Allergic rhinitis  ICD-10-CM: J30.9   ICD-9-CM: 477.9    07/19/2008 - Present                       Asthma  ICD-10-CM: J45.909   ICD-9-CM: 493.90    07/19/2008 - Present                       Lymphadenopathy  ICD-10-CM: R59.1   ICD-9-CM: 785.6    07/19/2008 - Present                          Greater than 30  minutes were spent with the patient on counseling and coordination of care      Signed:    Vanessa Kick, MD   02/25/2019   12:04 PM

## 2019-02-25 NOTE — Progress Notes (Signed)
Patient educated with discharge instructions and Rx. Patient verbalize understanding with adequate teach back. Patient signed copy of the discharge instructions that were then placed on the hard chart. Patient awaiting transport home.

## 2019-02-25 NOTE — Progress Notes (Signed)
Bedside and Verbal shift change report given to Rachel, RN (oncoming nurse) by Lisa, RN (offgoing nurse). Report included the following information SBAR, Kardex, ED Summary, Intake/Output, MAR and Recent Results.

## 2019-02-25 NOTE — Progress Notes (Signed)
Appointment Information  The following appointments have been successfully scheduled:    Date/time Wednesday, February 28, 2019 11:15 AM  Patient Denise Huber, Denise Huber 1986-11-03 [phone] F) #[id number] N#[id number]  Department [facility]  Appointment type Telemedicine 15 My Chart  Provider KEEFE LOBB

## 2019-02-25 NOTE — Progress Notes (Signed)
Progress  Notes by Vanessa Kick, MD at 02/25/19 1016                Author: Vanessa Kick, MD  Service: Internal Medicine  Author Type: Physician       Filed: 02/25/19 1021  Date of Service: 02/25/19 1016  Status: Signed          Editor: Vanessa Kick, MD (Physician)                    [facility]                                                                                              Hospitalist Progress Note   Vanessa Kick, MD   Answering service: [phone] OR [phone] from in house phone            Date of Service:  02/25/2019   NAME:  Denise Huber   DOB:  [birth year]   MRN:  [id number]           Admission Summary:           This is a 33 year old African American female with a past medical history of Crohn's disease that was diagnosed last November, asthma, depression, sickle cell trait.  She comes over here because of abdominal pain  that started since last two days.  She claims that since last November she has been having off and on abdominal pain, but since last two days she has been having consistent right lower quadrant abdominal pain, which was initially about 7/10 in intensity,  sharp, stabbing, radiating to back.  She did not have any fever.  No nausea or vomiting.  She usually has four to five bowel movements a day, which has not changed since last 2 days.  In that same regard, she spoke to her gastroenterologist, who is  Dr. Steva Ready, and she was asked to take 1000 mg of Tylenol four times daily, but despite that her pain got worse and she had to come into the ER.   ??   Interval history / Subjective:              Doing really good today.  No issues.  She said her diarrhea and abdominal pain is much better on antibiotics and steroids and had no pain yesterday    Will advance diet today           Assessment & Plan:             Abdominal pain.   History of Crohn's disease.  And presence of fistula.  Started on antibiotics and IV steroids.  GI on board.  Patient  is feeling much better today , change to solid diet       Asymptomatic bacteriuria    Follow-up urine culture.      Smoking   Cessation counseling      Sickle cell trait         Code status:    DVT prophylaxis:       Care Plan discussed with: Patient/Family  Anticipated Disposition: Home w/Family   Anticipated Discharge: Less than 24 hours           Hospital Problems   Date Reviewed:  02/23/2019                         Codes  Class  Noted  POA              * (Principal) Abdominal pain  ICD-10-CM: R10.9   ICD-9-CM: 789.00    02/23/2019  Yes                               Review of Systems:     A comprehensive review of systems was negative except for that written in the HPI.            Vital Signs:      Last 24hrs VS reviewed since prior progress note. Most recent are:   Visit Vitals      BP  144/71 (BP 1 Location: Right arm, BP Patient Position: At rest)     Pulse  60     Temp  98.6 ??F (37 ??C)     Resp  16     SpO2  98%        Breastfeeding  No              Intake/Output Summary (Last 24 hours) at 02/25/2019 1016   Last data filed at 02/24/2019 1208     Gross per 24 hour        Intake  480 ml        Output  --        Net  480 ml              Physical Examination:                     Constitutional:   No acute distress, cooperative, pleasant      ENT:   Oral mucosa moist, oropharynx benign.      Resp:   CTA bilaterally. No wheezing/rhonchi/rales. No accessory muscle use     CV:   Regular rhythm, normal rate, no murmurs, gallops, rubs         GI:   Soft, mild tenderness         Musculoskeletal:   No edema, warm, 2+ pulses throughout         Neurologic:   Moves all extremities.  AAOx3, CN II-XII reviewed                      Data Review:      Review and/or order of clinical lab test           Labs:          Recent Labs            02/24/19   0423  02/23/19   1619     WBC  13.7*  14.0*     HGB  10.0*  10.8*     HCT  31.1*  34.0*         PLT  410*  484*          Recent Labs            02/24/19   0423  02/23/19   1619     NA  140   139  K  3.8  3.5     CL  110*  107     CO2  23  28     BUN  4*  5*     CREA  0.56  0.73     GLU  134*  84         CA  8.4*  9.0          Recent Labs            02/24/19   0423  02/23/19   1619     SGOT  8*  11*     ALT  15  18     AP  104  117     TBILI  0.2  0.2     TP  7.0  8.0     ALB  3.0*  3.6     GLOB  4.0  4.4*         LPSE   --   178        No results for input(s): INR, PTP, APTT, INREXT, INREXT in the last 72 hours.    No results for input(s): FE, TIBC, PSAT, FERR in the last 72 hours.    No results found for: FOL, RBCF    No results for input(s): PH, PCO2, PO2 in the last 72 hours.   No results for input(s): CPK, CKNDX, TROIQ in the last 72 hours.      No lab exists for component: CPKMB   No results found for: CHOL, CHOLX, CHLST, CHOLV, HDL, HDLP, LDL, LDLC, DLDLP, TGLX, TRIGL, TRIGP, CHHD, CHHDX   No results found for: [facility]     Lab Results         Component  Value  Date/Time            Color  YELLOW/STRAW  02/23/2019 04:37 PM       Appearance  CLOUDY (A)  02/23/2019 04:37 PM       Specific gravity  1.023  02/23/2019 04:37 PM       Specific gravity  1.015  10/07/2018 11:19 AM       pH (UA)  6.5  02/23/2019 04:37 PM       Protein  NEGATIVE   02/23/2019 04:37 PM       Glucose  NEGATIVE   02/23/2019 04:37 PM       Ketone  NEGATIVE   02/23/2019 04:37 PM       Bilirubin  NEGATIVE   02/23/2019 04:37 PM       Urobilinogen  0.2  02/23/2019 04:37 PM       Nitrites  NEGATIVE   02/23/2019 04:37 PM       Leukocyte Esterase  NEGATIVE   02/23/2019 04:37 PM       Epithelial cells  MANY (A)  02/23/2019 04:37 PM       Bacteria  2+ (A)  02/23/2019 04:37 PM       WBC  0-4  02/23/2019 04:37 PM            RBC  0-5  02/23/2019 04:37 PM                Medications Reviewed:          Current Facility-Administered Medications          Medication  Dose  Route  Frequency           ?  sodium chloride (NS) flush 5-40 mL  5-40 mL  IntraVENous  Q8H     ?  sodium chloride (NS) flush 5-40 mL   5-40 mL  IntraVENous  PRN     ?  0.9%  sodium chloride infusion   125 mL/hr  IntraVENous  CONTINUOUS     ?  ondansetron (ZOFRAN) injection 4 mg   4 mg  IntraVENous  Q4H PRN     ?  methylPREDNISolone (PF) (SOLU-MEDROL) injection 40 mg   40 mg  IntraVENous  Q8H     ?  levoFLOXacin (LEVAQUIN) 750 mg in D5W IVPB   750 mg  IntraVENous  Q24H     ?  metroNIDAZOLE (FLAGYL) IVPB premix 500 mg   500 mg  IntraVENous  Q12H           ?  fentaNYL citrate (PF) injection 25 mcg   25 mcg  IntraVENous  Q4H PRN           ?  nicotine (NICODERM CQ) 21 mg/24 hr patch 1 Patch   1 Patch  TransDERmal  DAILY           ?  diphenhydrAMINE (BENADRYL) injection 12.5 mg   12.5 mg  IntraVENous  Q4H PRN        ______________________________________________________________________   EXPECTED LENGTH OF STAY: - - -   ACTUAL LENGTH OF STAY:          2                    Vanessa KickGuneet Zenola Dezarn, MD

## 2019-02-25 NOTE — Progress Notes (Signed)
Reason for Admission: Abdominal Pain                     RUR Score: 11%- LOW                    Plan for utilizing home health: TBD       PCP: First and Last name: Christy Gentles DO   Name of Practice: [facility]    Are you a current patient: Yes/No: Yes   Approximate date of last visit: Jan 2020                    Current Advanced Directive/Advance Care Plan: Pt does not have an AMD. Pt would not like further information at this time.                            Transition of Care Plan: CM contacted pt via telephone to discuss CM role and to assess pt needs. CM verified demographics including insurance and emergency contact information. Pt lives with ex-fiancee in an apartment; there are 10+ steps to enter home. Transportation: Pt reports friend will provide transportation home after discharge. Pt reports no DME use and IADLs prior to admission. Pt uses [facility] on [address] and reports no concerns with getting medications. Pt verified PCP and reports no concerns for discharge at this time. CM to follow and assist with disposition needs as they arise.    Care Management Interventions  PCP Verified by CM: Yes  Palliative Care Criteria Met (RRAT>21 & CHF Dx)?: No  Mode of Transport at Discharge: Other (see comment)(Friend)  Transition of Care Consult (CM Consult): Other(Intial Assessment)  MyChart Signup: No(MyChart Active)  Discharge Durable Medical Equipment: No  Physical Therapy Consult: No  Occupational Therapy Consult: No  Speech Therapy Consult: No  Current Support Network: Other  Confirm Follow Up Transport: Friends  Discharge Location  Discharge Placement: Home(Disposition TBD/subject to change pending care recommendations)    Silva Bandy RN, BSN  Care Management Department

## 2019-02-25 NOTE — Progress Notes (Signed)
Bedside and Verbal shift change report given to Rachel, RN (oncoming nurse) by Huntleigh Doolen, RN (offgoing nurse). Report included the following information SBAR, Kardex, ED Summary, Intake/Output, MAR and Recent Results.

## 2019-02-25 NOTE — Other (Signed)
DOWNGRADED TO OBSERVATION    ONCE RECIEVED AND COMPLETED PLEASE FAX BACK CONFIRMATION AND NEW OBS AUTH# OR WHETHER OR NOT OBS REQUIRES AN AUTH#    THANK YOU

## 2019-02-25 NOTE — Progress Notes (Signed)
Patient educated with discharge instructions and Rx. Patient verbalize understanding with adequate teach back. Patient signed copy of the discharge instructions that were then placed on the hard chart. Patient awaiting transport home.

## 2019-02-25 NOTE — Progress Notes (Signed)
[facility]                                                                                          Hospitalist Progress Note  Vanessa Kick, MD  Answering service: [phone] OR [phone] from in house phone        Date of Service:  02/25/2019  NAME:  Denise Huber  DOB:  December 21, 1985  MRN:  [id number]      Admission Summary:     This is a 33 year old African American female with a past medical history of Crohn's disease that was diagnosed last November, asthma, depression, sickle cell trait.  She comes over here because of abdominal pain that started since last two days.  She claims that since last November she has been having off and on abdominal pain, but since last two days she has been having consistent right lower quadrant abdominal pain, which was initially about 7/10 in intensity, sharp, stabbing, radiating to back.  She did not have any fever.  No nausea or vomiting.  She usually has four to five bowel movements a day, which has not changed since last 2 days.  In that same regard, she spoke to her gastroenterologist, who is Dr. Steva Ready, and she was asked to take 1000 mg of Tylenol four times daily, but despite that her pain got worse and she had to come into the ER.  ??  Interval history / Subjective:       Doing really good today.  No issues.  She said her diarrhea and abdominal pain is much better on antibiotics and steroids and had no pain yesterday   Will advance diet today      Assessment & Plan:       Abdominal pain.  History of Crohn's disease.  And presence of fistula.  Started on antibiotics and IV steroids.  GI on board.  Patient is feeling much better today , change to solid diet     Asymptomatic bacteriuria   Follow-up urine culture.    Smoking  Cessation counseling    Sickle cell trait      Code status:   DVT prophylaxis:     Care Plan discussed with: Patient/Family  Anticipated Disposition: Home w/Family  Anticipated Discharge: Less than 24 hours      Hospital Problems  Date Reviewed: 2019-02-25          Codes Class Noted POA    * (Principal) Abdominal pain ICD-10-CM: R10.9  ICD-9-CM: 789.00  February 25, 2019 Yes                Review of Systems:   A comprehensive review of systems was negative except for that written in the HPI.       Vital Signs:    Last 24hrs VS reviewed since prior progress note. Most recent are:  Visit Vitals  BP 144/71 (BP 1 Location: Right arm, BP Patient Position: At rest)   Pulse 60   Temp 98.6 ??F (37 ??C)   Resp 16   SpO2 98%   Breastfeeding No  Intake/Output Summary (Last 24 hours) at 02/25/2019 1016  Last data filed at 02/24/2019 1208  Gross per 24 hour   Intake 480 ml   Output ???   Net 480 ml        Physical Examination:             Constitutional:  No acute distress, cooperative, pleasant    ENT:  Oral mucosa moist, oropharynx benign.    Resp:  CTA bilaterally. No wheezing/rhonchi/rales. No accessory muscle use   CV:  Regular rhythm, normal rate, no murmurs, gallops, rubs    GI:  Soft, mild tenderness    Musculoskeletal:  No edema, warm, 2+ pulses throughout    Neurologic:  Moves all extremities.  AAOx3, CN II-XII reviewed            Data Review:    Review and/or order of clinical lab test      Labs:     Recent Labs     02/24/19  0423 02/23/19  1619   WBC 13.7* 14.0*   HGB 10.0* 10.8*   HCT 31.1* 34.0*   PLT 410* 484*     Recent Labs     02/24/19  0423 02/23/19  1619   NA 140 139   K 3.8 3.5   CL 110* 107   CO2 23 28   BUN 4* 5*   CREA 0.56 0.73   GLU 134* 84   CA 8.4* 9.0     Recent Labs     02/24/19  0423 02/23/19  1619   SGOT 8* 11*   ALT 15 18   AP 104 117   TBILI 0.2 0.2   TP 7.0 8.0   ALB 3.0* 3.6   GLOB 4.0 4.4*   LPSE  --  178     No results for input(s): INR, PTP, APTT, INREXT, INREXT in the last 72 hours.   No results for input(s): FE, TIBC, PSAT, FERR in the last 72 hours.   No results found for: FOL, RBCF   No results for input(s): PH, PCO2, PO2 in the last 72 hours.   No results for input(s): CPK, CKNDX, TROIQ in the last 72 hours.    No lab exists for component: CPKMB  No results found for: CHOL, CHOLX, CHLST, CHOLV, HDL, HDLP, LDL, LDLC, DLDLP, TGLX, TRIGL, TRIGP, CHHD, CHHDX  No results found for: [facility]  Lab Results   Component Value Date/Time    Color YELLOW/STRAW 02/23/2019 04:37 PM    Appearance CLOUDY (A) 02/23/2019 04:37 PM    Specific gravity 1.023 02/23/2019 04:37 PM    Specific gravity 1.015 10/07/2018 11:19 AM    pH (UA) 6.5 02/23/2019 04:37 PM    Protein NEGATIVE  02/23/2019 04:37 PM    Glucose NEGATIVE  02/23/2019 04:37 PM    Ketone NEGATIVE  02/23/2019 04:37 PM    Bilirubin NEGATIVE  02/23/2019 04:37 PM    Urobilinogen 0.2 02/23/2019 04:37 PM    Nitrites NEGATIVE  02/23/2019 04:37 PM    Leukocyte Esterase NEGATIVE  02/23/2019 04:37 PM    Epithelial cells MANY (A) 02/23/2019 04:37 PM    Bacteria 2+ (A) 02/23/2019 04:37 PM    WBC 0-4 02/23/2019 04:37 PM    RBC 0-5 02/23/2019 04:37 PM         Medications Reviewed:     Current Facility-Administered Medications   Medication Dose Route Frequency   ??? sodium chloride (NS) flush 5-40 mL  5-40 mL IntraVENous Q8H   ???  sodium chloride (NS) flush 5-40 mL  5-40 mL IntraVENous PRN   ??? 0.9% sodium chloride infusion  125 mL/hr IntraVENous CONTINUOUS   ??? ondansetron (ZOFRAN) injection 4 mg  4 mg IntraVENous Q4H PRN   ??? methylPREDNISolone (PF) (SOLU-MEDROL) injection 40 mg  40 mg IntraVENous Q8H   ??? levoFLOXacin (LEVAQUIN) 750 mg in D5W IVPB  750 mg IntraVENous Q24H   ??? metroNIDAZOLE (FLAGYL) IVPB premix 500 mg  500 mg IntraVENous Q12H   ??? fentaNYL citrate (PF) injection 25 mcg  25 mcg IntraVENous Q4H PRN   ??? nicotine (NICODERM CQ) 21 mg/24 hr patch 1 Patch  1 Patch TransDERmal DAILY   ??? diphenhydrAMINE (BENADRYL) injection 12.5 mg  12.5 mg IntraVENous Q4H PRN     ______________________________________________________________________  EXPECTED LENGTH OF STAY: - - -  ACTUAL LENGTH OF STAY:          2                  Vanessa Kick, MD

## 2019-02-25 NOTE — Progress Notes (Signed)
Reason for Admission: Abdominal Pain                     RUR Score: 11%- LOW                    Plan for utilizing home health: TBD       PCP: First and Last name: Keefe Lobb DO   Name of Practice: [facility]    Are you a current patient: Yes/No: Yes   Approximate date of last visit: Jan 2020                    Current Advanced Directive/Advance Care Plan: Pt does not have an AMD. Pt would not like further information at this time.                            Transition of Care Plan: CM contacted pt via telephone to discuss CM role and to assess pt needs. CM verified demographics including insurance and emergency contact information. Pt lives with ex-fiancee in an apartment; there are 10+ steps to enter home. Transportation: Pt reports friend will provide transportation home after discharge. Pt reports no DME use and IADLs prior to admission. Pt uses [facility] on [address] and reports no concerns with getting medications. Pt verified PCP and reports no concerns for discharge at this time. CM to follow and assist with disposition needs as they arise.    Care Management Interventions  PCP Verified by CM: Yes  Palliative Care Criteria Met (RRAT>21 & CHF Dx)?: No  Mode of Transport at Discharge: Other (see comment)(Friend)  Transition of Care Consult (CM Consult): Other(Intial Assessment)  MyChart Signup: No(MyChart Active)  Discharge Durable Medical Equipment: No  Physical Therapy Consult: No  Occupational Therapy Consult: No  Speech Therapy Consult: No  Current Support Network: Other  Confirm Follow Up Transport: Friends  Discharge Location  Discharge Placement: Home(Disposition TBD/subject to change pending care recommendations)    Jesseca White RN, BSN  Care Management Department
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
## 2019-02-25 NOTE — Progress Notes (Signed)
Appointment Information  The following appointments have been successfully scheduled:    Date/time Wednesday, February 28, 2019 11:15 AM  Patient Denise Huber, Denise Huber 05/12/1986 (33yo F) #[id number]  Department [facility]  Appointment type Telemedicine 15 My Chart  Provider KEEFE LOBB

## 2019-02-25 NOTE — Discharge Summary (Signed)
Discharge Summary       PATIENT ID: Denise Huber  MRN: [id number]   DATE OF BIRTH: Dec 11, 1985    DATE OF ADMISSION: 02/23/2019  4:03 PM    DATE OF DISCHARGE: 02/25/2019    PRIMARY CARE PROVIDER: Dietrich Pates, DO     ATTENDING PHYSICIAN:   DISCHARGING PROVIDER: Vanessa Kick, MD    To contact this individual call [phone] and ask the operator to page.  If unavailable ask to be transferred the Adult Hospitalist Department.    CONSULTATIONS: IP CONSULT TO HOSPITALIST  IP CONSULT TO GASTROENTEROLOGY    PROCEDURES/SURGERIES: * No surgery found *    ADMITTING DIAGNOSES & HOSPITAL COURSE:     This is a 33 year old African American female with a past medical history of Crohn's disease that was diagnosed last November, asthma, depression, sickle cell trait. ??She comes over here because of abdominal pain that started since last two days. ??She claims that since last November she has been having off and on abdominal pain, but since last two days she has been having consistent right lower quadrant abdominal pain, which was initially about 7/10 in intensity, sharp, stabbing, radiating to back. ??She did not have any fever. ??No nausea or vomiting. ??She usually has four to five bowel movements a day, which has not changed since last 2 days. ??In that same regard, she spoke to her gastroenterologist, who is Dr. Steva Ready, and she was asked to take 1000 mg of Tylenol four times daily, but despite that her pain got worse and she had to come into the ER.    Abdominal pain.  History of Crohn's disease.  And presence of fistula.  Started on antibiotics and IV steroids.  GI on board.  Patient is feeling much better today , change to solid diet and tolerated and discharged on oral antibiotics and steroids   ??  Asymptomatic bacteriuria   Follow-up urine culture.  ??  Smoking  Cessation counseling  ??  Sickle cell trait    DISCHARGE DIAGNOSES / PLAN:      Above       ADDITIONAL CARE RECOMMENDATIONS:      followup   PENDING TEST RESULTS:    At the time of discharge the following test results are still pending:     FOLLOW UP APPOINTMENTS:    Follow-up Information     Follow up With Specialties Details Why Contact Info    Dietrich Pates, DO Family Practice In 1 week  [address]  [address]  [place] [address]  [phone]               DIET: Regular Diet  Oral Nutritional Supplements:    ACTIVITY: Activity as tolerated    WOUND CARE:     EQUIPMENT needed:       DISCHARGE MEDICATIONS:  Current Discharge Medication List      START taking these medications    Details   metroNIDAZOLE (Flagyl) 500 mg tablet Take 1 Tab by mouth three (3) times daily for 7 days.  Qty: 21 Tab, Refills: 0      levoFLOXacin (Levaquin) 500 mg tablet Take 1 Tab by mouth daily for 7 days.  Qty: 7 Tab, Refills: 0      predniSONE (DELTASONE) 10 mg tablet Take 4 tablets daily oral for 3 days and then   Take 3 tabs daily oral for 3 days and then   Take 2 tablets daily for 3 days and then  Take 1 tablet daily for 3 days and stop  Qty: 30 Tab, Refills: 0               NOTIFY YOUR PHYSICIAN FOR ANY OF THE FOLLOWING:   Fever over 101 degrees for 24 hours.   Chest pain, shortness of breath, fever, chills, nausea, vomiting, diarrhea, change in mentation, falling, weakness, bleeding. Severe pain or pain not relieved by medications.  Or, any other signs or symptoms that you may have questions about.    DISPOSITION:  x  Home With:   OT  PT  HH  RN       Long term SNF/Inpatient Rehab    Independent/assisted living    Hospice    Other:       PATIENT CONDITION AT DISCHARGE:     Functional status    Poor     Deconditioned     Independent      Cognition    xx Lucid     Forgetful     Dementia      Catheters/lines (plus indication)    Foley     PICC     PEG    x None      Code status   x  Full code     DNR      PHYSICAL EXAMINATION AT DISCHARGE:  General:          Alert, cooperative, no distress, appears stated age.     HEENT:           Atraumatic, anicteric sclerae, pink conjunctivae                           No oral ulcers, mucosa moist, throat clear, dentition fair  Neck:               Supple, symmetrical  Lungs:             Clear to auscultation bilaterally.  No Wheezing or Rhonchi. No rales.  Chest wall:      No tenderness  No Accessory muscle use.  Heart:              Regular  rhythm,  No  murmur   No edema  Abdomen:        Soft, non-tender. Not distended.  Bowel sounds normal  Extremities:     No cyanosis.  No clubbing,                            Skin turgor normal, Capillary refill normal  Skin:                Not pale.  Not Jaundiced  No rashes   Psych:             Not anxious or agitated.  Neurologic:      Alert, moves all extremities, answers questions appropriately and responds to commands       CHRONIC MEDICAL DIAGNOSES:  Problem List as of 02/25/2019 Date Reviewed: 03-06-19          Codes Class Noted - Resolved    * (Principal) Abdominal pain ICD-10-CM: R10.9  ICD-9-CM: 789.00  2019/03/06 - Present        Crohn's disease with complication (HCC) ICD-10-CM: K50.919  ICD-9-CM: 555.9  12/14/2018 - Present        Insomnia ICD-10-CM: G47.00  ICD-9-CM: 780.52  03/13/2013 - Present  Pap smear, as part of routine gynecological examination ICD-10-CM: Z01.419  ICD-9-CM: V76.2  03/24/2011 - Present    Overview Signed 10/31/2014  9:24 AM by Christene Slates, LPN     Dr. Carlye Grippe [facility]             Depression ICD-10-CM: F32.9  ICD-9-CM: 311  12/25/2008 - Present        Allergic rhinitis ICD-10-CM: J30.9  ICD-9-CM: 477.9  07/19/2008 - Present        Asthma ICD-10-CM: J45.909  ICD-9-CM: 493.90  07/19/2008 - Present        Lymphadenopathy ICD-10-CM: R59.1  ICD-9-CM: 785.6  07/19/2008 - Present              Greater than 30  minutes were spent with the patient on counseling and coordination of care    Signed:   Vanessa Kick, MD  02/25/2019  12:04 PM

## 2019-02-26 NOTE — Progress Notes (Signed)
 COVID-19 Screening Initial Follow-up Note    Patient contacted regarding COVID-19  risk.  Care Transition Nurse/ Ambulatory Care Manager contacted the patient by telephone to perform post discharge assessment. Verified name and DOB with patient as identifiers. Provided introduction to self, and explanation of the CTN/ACM role, and reason for call due to risk factors for infection and/or exposure to COVID-19.     Symptoms reviewed with patient who verbalized the following symptoms:no respiratory symptoms. Had some discomfort last night, ? From something she ate. Related to the Crohns flare.   Has scheduled f/u with Dr.Lobb for 4/8.  Dr.Sandhu, 4/20.       Patient has following risk factors of: Crohns,immunocompromised. CTN/ACM reviewed discharge instructions, medical action plan and red flags such as increased shortness of breath, increasing fever and signs of decompensation with patient who verbalized understanding.   Discussed exposure protocols and quarantine with CDC Guidelines "What to do if you are sick with coronavirus disease 2019" Patient who was given an opportunity for questions and concerns. The patient agrees to contact the [facility] exposure line [phone], local health department [place]  Department of Health  [phone] and PCP office for questions related to their healthcare. CTN/ACM provided contact information for future reference.    Reviewed and educated patient on any new and changed medications related to discharge diagnosis.      Plan for follow-up call in 14 days based on severity of symptoms and risk factors

## 2019-02-26 NOTE — Progress Notes (Signed)
COVID-19 Screening Initial Follow-up Note    Patient contacted regarding COVID-19  risk.  Care Transition Nurse/ Ambulatory Care Manager contacted the patient by telephone to perform post discharge assessment. Verified name and DOB with patient as identifiers. Provided introduction to self, and explanation of the CTN/ACM role, and reason for call due to risk factors for infection and/or exposure to COVID-19.     Symptoms reviewed with patient who verbalized the following symptoms:no respiratory symptoms. Had some discomfort last night, ? From something she ate. Related to the Crohns flare.   Has scheduled f/u with Dr.Lobb for 4/8.  Dr.Sandhu, 4/20.       Patient has following risk factors of: Crohns,immunocompromised. CTN/ACM reviewed discharge instructions, medical action plan and red flags such as increased shortness of breath, increasing fever and signs of decompensation with patient who verbalized understanding.   Discussed exposure protocols and quarantine with CDC Guidelines ???What to do if you are sick with coronavirus disease 2019??? Patient who was given an opportunity for questions and concerns. The patient agrees to contact the [facility] exposure line [phone], local health department [place] Department of Health  ([phone] and PCP office for questions related to their healthcare. CTN/ACM provided contact information for future reference.    Reviewed and educated patient on any new and changed medications related to discharge diagnosis.      Plan for follow-up call in 14 days based on severity of symptoms and risk factors

## 2019-02-28 ENCOUNTER — Telehealth: Attending: Family Medicine | Primary: Family Medicine

## 2019-02-28 ENCOUNTER — Encounter: Attending: Family Medicine | Primary: Family Medicine

## 2019-02-28 ENCOUNTER — Telehealth
Admit: 2019-02-28 | Discharge: 2019-02-28 | Payer: PRIVATE HEALTH INSURANCE | Attending: Family Medicine | Primary: Family Medicine

## 2019-02-28 DIAGNOSIS — K50919 Crohn's disease, unspecified, with unspecified complications: Secondary | ICD-10-CM

## 2019-02-28 NOTE — Progress Notes (Signed)
Denise Huber is a 33 y.o. female   Chief Complaint   Patient presents with   ??? Abdominal Pain    Pt here for recent hospital follow up for crohn's flare.  Is on levaquin flagyl and pred tapering.  Pt states she had a BM today first time since Saturday.  BM was not bloody, no vomiting.  Pt sees Dr Steva Ready for GI and has an appt on the 20th and will likely have another colonoscopy.  Was on mesalamine and was d/c due to not helping.  Pt has thought of possible biologic.  Has 3 children not planning on anymore.  Pt will disuss further with GI    she is a 33 y.o. year old female who presents for evalution.      Reviewed PmHx, RxHx, FmHx, SocHx, AllgHx and updated and dated in the chart.    Review of Systems - negative except as listed above in the HPI    Objective:   There were no vitals filed for this visit.    Current Outpatient Medications   Medication Sig   ??? acetaminophen-codeine (Tylenol-Codeine #3) 300-30 mg per tablet Take 1 Tab by mouth every four (4) hours as needed for Pain for up to 3 days. Max Daily Amount: 6 Tabs.   ??? levoFLOXacin (Levaquin) 500 mg tablet Take 1 Tab by mouth daily for 7 days.   ??? metroNIDAZOLE (Flagyl) 500 mg tablet Take 1 Tab by mouth three (3) times daily for 7 days.   ??? predniSONE (DELTASONE) 10 mg tablet Take 4 tablets daily oral for 3 days and then   Take 3 tabs daily oral for 3 days and then   Take 2 tablets daily for 3 days and then   Take 1 tablet daily for 3 days and stop     No current facility-administered medications for this visit.        Physical Examination: General appearance - alert, well appearing, and in no distress  Abdomen - tenderness noted very mild RLQ when asked pt to palpate      Assessment/ Plan:   Diagnoses and all orders for this visit:    1. Crohn's disease with complication, unspecified gastrointestinal tract location [facility])     improving  Discussed precautions on pred with coronavirus.  Follow-up and Dispositions    ?? Return if symptoms worsen or fail to  improve.           I have discussed the diagnosis with the patient and the intended plan as seen in the above orders.  The patient has received an after-visit summary and questions were answered concerning future plans. Pt conveyed understanding of plan.    Medication Side Effects and Warnings were discussed with patient      Dietrich Pates, DO   Pursuant to the emergency declaration under the [facility] Act and [employer], [phone] waiver authority and the [employer] Act, this Virtual Visit was conducted, with patient's consent, to reduce the patient's risk of exposure to COVID-19 and provide continuity of care for an established patient.     Services were provided through a video synchronous discussion virtually to substitute for in-person appointment.

## 2019-02-28 NOTE — Patient Instructions (Signed)
A Healthy Lifestyle: Care Instructions  Your Care Instructions    A healthy lifestyle can help you feel good, stay at a healthy weight, and have plenty of energy for both work and play. A healthy lifestyle is something you can share with your whole family.  A healthy lifestyle also can lower your risk for serious health problems, such as high blood pressure, heart disease, and diabetes.  You can follow a few steps listed below to improve your health and the health of your family.  Follow-up care is a key part of your treatment and safety. Be sure to make and go to all appointments, and call your doctor if you are having problems. It's also a good idea to know your test results and keep a list of the medicines you take.  How can you care for yourself at home?  ?? Do not eat too much sugar, fat, or fast foods. You can still have dessert and treats now and then. The goal is moderation.  ?? Start small to improve your eating habits. Pay attention to portion sizes, drink less juice and soda pop, and eat more fruits and vegetables.  ? Eat a healthy amount of food. A 3-ounce serving of meat, for example, is about the size of a deck of cards. Fill the rest of your plate with vegetables and whole grains.  ? Limit the amount of soda and sports drinks you have every day. Drink more water when you are thirsty.  ? Eat at least 5 servings of fruits and vegetables every day. It may seem like a lot, but it is not hard to reach this goal. A serving or helping is 1 piece of fruit, 1 cup of vegetables, or 2 cups of leafy, raw vegetables. Have an apple or some carrot sticks as an afternoon snack instead of a candy bar. Try to have fruits and/or vegetables at every meal.  ?? Make exercise part of your daily routine. You may want to start with simple activities, such as walking, bicycling, or slow swimming. Try to be active 30 to 60 minutes every day. You do not need to do all 30 to 60  minutes all at once. For example, you can exercise 3 times a day for 10 or 20 minutes. Moderate exercise is safe for most people, but it is always a good idea to talk to your doctor before starting an exercise program.  ?? Keep moving. Mow the lawn, work in the garden, or clean your house. Take the stairs instead of the elevator at work.  ?? If you smoke, quit. People who smoke have an increased risk for heart attack, stroke, cancer, and other lung illnesses. Quitting is hard, but there are ways to boost your chance of quitting tobacco for good.  ? Use nicotine gum, patches, or lozenges.  ? Ask your doctor about stop-smoking programs and medicines.  ? Keep trying.  In addition to reducing your risk of diseases in the future, you will notice some benefits soon after you stop using tobacco. If you have shortness of breath or asthma symptoms, they will likely get better within a few weeks after you quit.  ?? Limit how much alcohol you drink. Moderate amounts of alcohol (up to 2 drinks a day for men, 1 drink a day for women) are okay. But drinking too much can lead to liver problems, high blood pressure, and other health problems.  Family health  If you have a family, there are many things you   can do together to improve your health.  ?? Eat meals together as a family as often as possible.  ?? Eat healthy foods. This includes fruits, vegetables, lean meats and dairy, and whole grains.  ?? Include your family in your fitness plan. Most people think of activities such as jogging or tennis as the way to fitness, but there are many ways you and your family can be more active. Anything that makes you breathe hard and gets your heart pumping is exercise. Here are some tips:  ? Walk to do errands or to take your child to school or the bus.  ? Go for a family bike ride after dinner instead of watching TV.  Where can you learn more?  Go to http://www.healthwise.net/GoodHelp[employer]ctions   Enter U807 in the search box to learn more about "A Healthy Lifestyle: Care Instructions."  Current as of: May 28, 2019Content Version: 12.4  ?? 2006-2020 Healthwise[employer].  Care instructions adapted under license by [employer] (which disclaims liability or warranty for this information). If you have questions about a medical condition or this instruction, always ask your healthcare professional. Healthwise, Incorporated disclaims any warranty or liability for your use of this information.

## 2019-02-28 NOTE — Progress Notes (Signed)
JAVIONNA BURCH is a 33 y.o. female   Chief Complaint   Patient presents with   ??? Abdominal Pain    Pt here for recent hospital follow up for crohn's flare.  Is on levaquin flagyl and pred tapering.  Pt states she had a BM today first time since Saturday.  BM was not bloody, no vomiting.  Pt sees Dr Steva Ready for GI and has an appt on the 20th and will likely have another colonoscopy.  Was on mesalamine and was d/c due to not helping.  Pt has thought of possible biologic.  Has 3 children not planning on anymore.  Pt will disuss further with GI    she is a 33 y.o. year old female who presents for evalution.      Reviewed PmHx, RxHx, FmHx, SocHx, AllgHx and updated and dated in the chart.    Review of Systems - negative except as listed above in the HPI    Objective:   There were no vitals filed for this visit.    Current Outpatient Medications   Medication Sig   ??? acetaminophen-codeine (Tylenol-Codeine #3) 300-30 mg per tablet Take 1 Tab by mouth every four (4) hours as needed for Pain for up to 3 days. Max Daily Amount: 6 Tabs.   ??? levoFLOXacin (Levaquin) 500 mg tablet Take 1 Tab by mouth daily for 7 days.   ??? metroNIDAZOLE (Flagyl) 500 mg tablet Take 1 Tab by mouth three (3) times daily for 7 days.   ??? predniSONE (DELTASONE) 10 mg tablet Take 4 tablets daily oral for 3 days and then   Take 3 tabs daily oral for 3 days and then   Take 2 tablets daily for 3 days and then   Take 1 tablet daily for 3 days and stop     No current facility-administered medications for this visit.        Physical Examination: General appearance - alert, well appearing, and in no distress  Abdomen - tenderness noted very mild RLQ when asked pt to palpate      Assessment/ Plan:   Diagnoses and all orders for this visit:    1. Crohn's disease with complication, unspecified gastrointestinal tract location [facility])     improving  Discussed precautions on pred with coronavirus.  Follow-up and Dispositions     ?? Return if symptoms worsen or fail to improve.           I have discussed the diagnosis with the patient and the intended plan as seen in the above orders.  The patient has received an after-visit summary and questions were answered concerning future plans. Pt conveyed understanding of plan.    Medication Side Effects and Warnings were discussed with patient      Dietrich Pates, DO   Pursuant to the emergency declaration under the [facility] Act and [employer], [phone] waiver authority and the [employer] Act, this Virtual Visit was conducted, with patient's consent, to reduce the patient's risk of exposure to COVID-19 and provide continuity of care for an established patient.     Services were provided through a video synchronous discussion virtually to substitute for in-person appointment.

## 2019-03-13 NOTE — Progress Notes (Signed)
Patient resolved from Transition of Care episode on 03/13/2019  Patient/family has been provided the following resources and education related to COVID-19:                         Signs, symptoms and red flags related to COVID-19            CDC exposure and quarantine guidelines            [facility] exposure contact - [phone]            Contact for their local Department of Health                 Patient currently reports that the following symptoms have improved:  still no respiratory symptoms. Occasional abd pain, just saw GI, has prescribed new meds for her. Reminded to isolate, wash hands, wear mask if around others.    No further outreach scheduled with this CTN/ACM.  Episode of Care resolved.  Patient has this CTN/ACM contact information if future needs arise.

## 2019-03-13 NOTE — Progress Notes (Signed)
Patient resolved from Transition of Care episode on 03/13/2019  Patient/family has been provided the following resources and education related to COVID-19:                         Signs, symptoms and red flags related to COVID-19            CDC exposure and quarantine guidelines            [facility] exposure contact - [phone]            Contact for their local Department of Health                 Patient currently reports that the following symptoms have improved:  still no respiratory symptoms. Occasional abd pain, just saw GI, has prescribed new meds for her. Reminded to isolate, wash hands, wear mask if around others.    No further outreach scheduled with this CTN/ACM.  Episode of Care resolved.  Patient has this CTN/ACM contact information if future needs arise.

## 2019-04-26 ENCOUNTER — Inpatient Hospital Stay: Admit: 2019-04-26 | Payer: PRIVATE HEALTH INSURANCE | Attending: Gastroenterology | Primary: Family Medicine

## 2019-04-26 ENCOUNTER — Encounter

## 2019-04-26 DIAGNOSIS — K5090 Crohn's disease, unspecified, without complications: Secondary | ICD-10-CM

## 2019-06-22 ENCOUNTER — Ambulatory Visit: Attending: Family Medicine | Primary: Family Medicine

## 2019-06-22 ENCOUNTER — Ambulatory Visit: Admit: 2019-06-22 | Payer: PRIVATE HEALTH INSURANCE | Attending: Family Medicine | Primary: Family Medicine

## 2019-06-22 DIAGNOSIS — N926 Irregular menstruation, unspecified: Secondary | ICD-10-CM

## 2019-06-22 MED ORDER — LEVONORGESTREL-ETHINYL ESTRADIOL 0.1 MG-20 MCG TAB
PACK | Freq: Every day | ORAL | 11 refills | Status: DC
Start: 2019-06-22 — End: 2020-03-28

## 2019-06-22 NOTE — Progress Notes (Signed)
Progress Note    she is a 33 y.o. year old female who presents for evalution.    Subjective:     Pt wants to start a birth control.  Pt currently has 3 children.  Does have irregular periods at times.  Pt is a smoker and strongly urged to stop smoking and if she continues to smoke at 51 wil d/c ocp.      Pt has crohns disease and started humira a month ago.  States stomach pains are finally better.      Reviewed PmHx, RxHx, FmHx, SocHx, AllgHx and updated and dated in the chart.    Review of Systems - negative except as listed above in the HPI    Objective:     Vitals:    06/22/19 1419   BP: 108/70   Pulse: 87   Resp: 16   Temp: 98.1 ??F (36.7 ??C)   TempSrc: Oral   SpO2: 98%   Weight: 177 lb (80.3 kg)   Height: 5\' 9"  (1.753 m)       Current Outpatient Medications   Medication Sig   ??? adalimumab (Humira) 40 mg/0.8 mL injection by SubCUTAneous route once.   ??? levonorgestrel-ethinyl estradiol (AVIANE, ALESSE, LESSINA) 0.1-20 mg-mcg tab Take 1 Tab by mouth daily.   ??? predniSONE (DELTASONE) 10 mg tablet Take 4 tablets daily oral for 3 days and then   Take 3 tabs daily oral for 3 days and then   Take 2 tablets daily for 3 days and then   Take 1 tablet daily for 3 days and stop     No current facility-administered medications for this visit.        Physical Examination: General appearance - alert, well appearing, and in no distress  Chest - clear to auscultation, no wheezes, rales or rhonchi, symmetric air entry  Heart - normal rate, regular rhythm, normal S1, S2, no murmurs, rubs, clicks or gallops      Assessment/ Plan:   Diagnoses and all orders for this visit:    1. Irregular periods  -     levonorgestrel-ethinyl estradiol (AVIANE, ALESSE, LESSINA) 0.1-20 mg-mcg tab; Take 1 Tab by mouth daily.     discussed smoking cessation  Follow-up and Dispositions    ?? Return in about 1 year (around 06/21/2020), or if symptoms worsen or fail to improve.           I have discussed the diagnosis with the patient and the  intended plan as seen in the above orders.  The patient has received an after-visit summary and questions were answered concerning future plans. Pt conveyed understanding of plan.    Medication Side Effects and Warnings were discussed with patient      Esau Grew, DO

## 2019-06-22 NOTE — Progress Notes (Signed)
 Chief Complaint   Patient presents with   . Contraception     Patient presents in office today to discuss going on [facility].  No concerns.    1. Have you been to the ER, urgent care clinic since your last visit?  Hospitalized since your last visit?No    2. Have you seen or consulted any other health care providers outside of the [facility] since your last visit?  Include any pap smears or colon screening. No    Learning Assessment 12/06/2014   PRIMARY LEARNER Patient   HIGHEST LEVEL OF EDUCATION - PRIMARY LEARNER  GRADUATED HIGH SCHOOL OR GED   BARRIERS PRIMARY LEARNER NONE   CO-LEARNER CAREGIVER No   PRIMARY LANGUAGE ENGLISH   LEARNER PREFERENCE PRIMARY READING   ANSWERED BY Patient   RELATIONSHIP SELF

## 2019-06-22 NOTE — Progress Notes (Signed)
Chief Complaint   Patient presents with   ??? Contraception     Patient presents in office today to discuss going on BC.  No concerns.    1. Have you been to the ER, urgent care clinic since your last visit?  Hospitalized since your last visit?No    2. Have you seen or consulted any other health care providers outside of the [facility] since your last visit?  Include any pap smears or colon screening. No    Learning Assessment 12/06/2014   PRIMARY LEARNER Patient   HIGHEST LEVEL OF EDUCATION - PRIMARY LEARNER  GRADUATED HIGH SCHOOL OR GED   BARRIERS PRIMARY LEARNER NONE   CO-LEARNER CAREGIVER No   PRIMARY LANGUAGE ENGLISH   LEARNER PREFERENCE PRIMARY READING   ANSWERED BY Patient   RELATIONSHIP SELF

## 2019-06-22 NOTE — Progress Notes (Signed)
Progress Note    she is a 33 y.o. year old female who presents for evalution.    Subjective:     Pt wants to start a birth control.  Pt currently has 3 children.  Does have irregular periods at times.  Pt is a smoker and strongly urged to stop smoking and if she continues to smoke at 45 wil d/c ocp.      Pt has crohns disease and started humira a month ago.  States stomach pains are finally better.      Reviewed PmHx, RxHx, FmHx, SocHx, AllgHx and updated and dated in the chart.    Review of Systems - negative except as listed above in the HPI    Objective:     Vitals:    06/22/19 1419   BP: 108/70   Pulse: 87   Resp: 16   Temp: 98.1 ??F (36.7 ??C)   TempSrc: Oral   SpO2: 98%   Weight: 177 lb (80.3 kg)   Height: 5\' 9"  (1.753 m)       Current Outpatient Medications   Medication Sig   ??? adalimumab (Humira) 40 mg/0.8 mL injection by SubCUTAneous route once.   ??? levonorgestrel-ethinyl estradiol (AVIANE, ALESSE, LESSINA) 0.1-20 mg-mcg tab Take 1 Tab by mouth daily.   ??? predniSONE (DELTASONE) 10 mg tablet Take 4 tablets daily oral for 3 days and then   Take 3 tabs daily oral for 3 days and then   Take 2 tablets daily for 3 days and then   Take 1 tablet daily for 3 days and stop     No current facility-administered medications for this visit.        Physical Examination: General appearance - alert, well appearing, and in no distress  Chest - clear to auscultation, no wheezes, rales or rhonchi, symmetric air entry  Heart - normal rate, regular rhythm, normal S1, S2, no murmurs, rubs, clicks or gallops      Assessment/ Plan:   Diagnoses and all orders for this visit:    1. Irregular periods  -     levonorgestrel-ethinyl estradiol (AVIANE, ALESSE, LESSINA) 0.1-20 mg-mcg tab; Take 1 Tab by mouth daily.     discussed smoking cessation  Follow-up and Dispositions    ?? Return in about 1 year (around 06/21/2020), or if symptoms worsen or fail to improve.            I have discussed the diagnosis with the patient and the intended plan as seen in the above orders.  The patient has received an after-visit summary and questions were answered concerning future plans. Pt conveyed understanding of plan.    Medication Side Effects and Warnings were discussed with patient      Esau Grew, DO

## 2019-06-22 NOTE — Patient Instructions (Signed)
A Healthy Lifestyle: Care Instructions  Your Care Instructions     A healthy lifestyle can help you feel good, stay at a healthy weight, and have plenty of energy for both work and play. A healthy lifestyle is something you can share with your whole family.  A healthy lifestyle also can lower your risk for serious health problems, such as high blood pressure, heart disease, and diabetes.  You can follow a few steps listed below to improve your health and the health of your family.  Follow-up care is a key part of your treatment and safety. Be sure to make and go to all appointments, and call your doctor if you are having problems. It's also a good idea to know your test results and keep a list of the medicines you take.  How can you care for yourself at home?  ?? Do not eat too much sugar, fat, or fast foods. You can still have dessert and treats now and then. The goal is moderation.  ?? Start small to improve your eating habits. Pay attention to portion sizes, drink less juice and soda pop, and eat more fruits and vegetables.  ? Eat a healthy amount of food. A 3-ounce serving of meat, for example, is about the size of a deck of cards. Fill the rest of your plate with vegetables and whole grains.  ? Limit the amount of soda and sports drinks you have every day. Drink more water when you are thirsty.  ? Eat at least 5 servings of fruits and vegetables every day. It may seem like a lot, but it is not hard to reach this goal. A serving or helping is 1 piece of fruit, 1 cup of vegetables, or 2 cups of leafy, raw vegetables. Have an apple or some carrot sticks as an afternoon snack instead of a candy bar. Try to have fruits and/or vegetables at every meal.  ?? Make exercise part of your daily routine. You may want to start with simple activities, such as walking, bicycling, or slow swimming. Try to be active 30 to 60 minutes every day. You do not need to do all 30 to 60  minutes all at once. For example, you can exercise 3 times a day for 10 or 20 minutes. Moderate exercise is safe for most people, but it is always a good idea to talk to your doctor before starting an exercise program.  ?? Keep moving. Mow the lawn, work in the garden, or clean your house. Take the stairs instead of the elevator at work.  ?? If you smoke, quit. People who smoke have an increased risk for heart attack, stroke, cancer, and other lung illnesses. Quitting is hard, but there are ways to boost your chance of quitting tobacco for good.  ? Use nicotine gum, patches, or lozenges.  ? Ask your doctor about stop-smoking programs and medicines.  ? Keep trying.  In addition to reducing your risk of diseases in the future, you will notice some benefits soon after you stop using tobacco. If you have shortness of breath or asthma symptoms, they will likely get better within a few weeks after you quit.  ?? Limit how much alcohol you drink. Moderate amounts of alcohol (up to 2 drinks a day for men, 1 drink a day for women) are okay. But drinking too much can lead to liver problems, high blood pressure, and other health problems.  Family health  If you have a family, there are many things   you can do together to improve your health.  ?? Eat meals together as a family as often as possible.  ?? Eat healthy foods. This includes fruits, vegetables, lean meats and dairy, and whole grains.  ?? Include your family in your fitness plan. Most people think of activities such as jogging or tennis as the way to fitness, but there are many ways you and your family can be more active. Anything that makes you breathe hard and gets your heart pumping is exercise. Here are some tips:  ? Walk to do errands or to take your child to school or the bus.  ? Go for a family bike ride after dinner instead of watching TV.  Where can you learn more?  Go to http://www.healthwise.net/GoodHelpConnections   Enter U807 in the search box to learn more about "A Healthy Lifestyle: Care Instructions."  Current as of: December 22, 2018??????????????????????????????Content Version: 12.5  ?? 2006-2020 Healthwise, Incorporated.   Care instructions adapted under license by [employer] (which disclaims liability or warranty for this information). If you have questions about a medical condition or this instruction, always ask your healthcare professional. Healthwise, Incorporated disclaims any warranty or liability for your use of this information.

## 2019-10-23 ENCOUNTER — Telehealth: Attending: Family Medicine | Primary: Family Medicine

## 2019-10-23 ENCOUNTER — Telehealth
Admit: 2019-10-23 | Discharge: 2019-10-23 | Payer: PRIVATE HEALTH INSURANCE | Attending: Family Medicine | Primary: Family Medicine

## 2019-10-23 DIAGNOSIS — N946 Dysmenorrhea, unspecified: Secondary | ICD-10-CM

## 2019-10-23 MED ORDER — MEDROXYPROGESTERONE (DEPO-PROVERA) 150 MG/ML IM SYRINGE
150 mg/mL | INJECTION | Freq: Once | INTRAMUSCULAR | 3 refills | Status: AC
Start: 2019-10-23 — End: 2019-10-23

## 2019-10-23 NOTE — Progress Notes (Signed)
Progress Note    she is a 33 y.o. year old female who presents for evalution.    Subjective:   Pt currently on OCP for dysmenorrhea and states forgets sometimes so would like to switch to depo shot.  Was on this many years ago and did fine with it.  Gets cramps and painful periods at times.        Reviewed PmHx, RxHx, FmHx, SocHx, AllgHx and updated and dated in the chart.    Review of Systems - negative except as listed above in the HPI    Objective:   There were no vitals filed for this visit.    Current Outpatient Medications   Medication Sig   ??? medroxyPROGESTERone (DEPO-PROVERA) 150 mg/mL syrg 1 mL by IntraMUSCular route once for 1 dose.   ??? adalimumab (Humira) 40 mg/0.8 mL injection by SubCUTAneous route once.   ??? levonorgestrel-ethinyl estradiol (AVIANE, ALESSE, LESSINA) 0.1-20 mg-mcg tab Take 1 Tab by mouth daily.   ??? predniSONE (DELTASONE) 10 mg tablet Take 4 tablets daily oral for 3 days and then   Take 3 tabs daily oral for 3 days and then   Take 2 tablets daily for 3 days and then   Take 1 tablet daily for 3 days and stop     No current facility-administered medications for this visit.        Physical Examination: General appearance - alert, well appearing, and in no distress  Mental status - alert, oriented to person, place, and time      Assessment/ Plan:   Diagnoses and all orders for this visit:    1. Dysmenorrhea  -     medroxyPROGESTERone (DEPO-PROVERA) 150 mg/mL syrg; 1 mL by IntraMUSCular route once for 1 dose.     has appt for 3rd for injection only.    Follow-up and Dispositions    ?? Return if symptoms worsen or fail to improve.           I have discussed the diagnosis with the patient and the intended plan as seen in the above orders.  The patient has received an after-visit summary and questions were answered concerning future plans. Pt conveyed understanding of plan.    Medication Side Effects and Warnings were discussed with patient      Esau Grew, DO         EDEE NIFONG is  a 33 y.o. female being evaluated by a Virtual Visit (video visit) encounter to address concerns as mentioned above.  A caregiver was present when appropriate. 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  Pursuant to the emergency declaration under the [place], [place] waiver authority and the [employer] Act, this Virtual Visit was conducted with patient's (and/or legal guardian's) consent, to reduce the risk of exposure to COVID-19 and provide necessary medical care.      Services were provided through a video synchronous discussion virtually to substitute for in-person encounter.    --Esau Grew, DO on 10/23/2019 at 8:23 AM    An electronic signature was used to authenticate this note.

## 2019-10-23 NOTE — Progress Notes (Signed)
Progress Note    she is a 33 y.o. year old female who presents for evalution.    Subjective:   Pt currently on OCP for dysmenorrhea and states forgets sometimes so would like to switch to depo shot.  Was on this many years ago and did fine with it.  Gets cramps and painful periods at times.        Reviewed PmHx, RxHx, FmHx, SocHx, AllgHx and updated and dated in the chart.    Review of Systems - negative except as listed above in the HPI    Objective:   There were no vitals filed for this visit.    Current Outpatient Medications   Medication Sig   ??? medroxyPROGESTERone (DEPO-PROVERA) 150 mg/mL syrg 1 mL by IntraMUSCular route once for 1 dose.   ??? adalimumab (Humira) 40 mg/0.8 mL injection by SubCUTAneous route once.   ??? levonorgestrel-ethinyl estradiol (AVIANE, ALESSE, LESSINA) 0.1-20 mg-mcg tab Take 1 Tab by mouth daily.   ??? predniSONE (DELTASONE) 10 mg tablet Take 4 tablets daily oral for 3 days and then   Take 3 tabs daily oral for 3 days and then   Take 2 tablets daily for 3 days and then   Take 1 tablet daily for 3 days and stop     No current facility-administered medications for this visit.        Physical Examination: General appearance - alert, well appearing, and in no distress  Mental status - alert, oriented to person, place, and time      Assessment/ Plan:   Diagnoses and all orders for this visit:    1. Dysmenorrhea  -     medroxyPROGESTERone (DEPO-PROVERA) 150 mg/mL syrg; 1 mL by IntraMUSCular route once for 1 dose.     has appt for 3rd for injection only.    Follow-up and Dispositions    ?? Return if symptoms worsen or fail to improve.           I have discussed the diagnosis with the patient and the intended plan as seen in the above orders.  The patient has received an after-visit summary and questions were answered concerning future plans. Pt conveyed understanding of plan.    Medication Side Effects and Warnings were discussed with patient      Esau Grew, DO          ANASTAISA WOODING is a 33 y.o. female being evaluated by a Virtual Visit (video visit) encounter to address concerns as mentioned above.  A caregiver was present when appropriate. 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  Pursuant to the emergency declaration under the [place], [place] waiver authority and the [employer] Act, this Virtual Visit was conducted with patient's (and/or legal guardian's) consent, to reduce the risk of exposure to COVID-19 and provide necessary medical care.      Services were provided through a video synchronous discussion virtually to substitute for in-person encounter.    --Esau Grew, DO on 10/23/2019 at 8:23 AM    An electronic signature was used to authenticate this note.

## 2019-10-23 NOTE — Patient Instructions (Signed)
A Healthy Lifestyle: Care Instructions  Your Care Instructions     A healthy lifestyle can help you feel good, stay at a healthy weight, and have plenty of energy for both work and play. A healthy lifestyle is something you can share with your whole family.  A healthy lifestyle also can lower your risk for serious health problems, such as high blood pressure, heart disease, and diabetes.  You can follow a few steps listed below to improve your health and the health of your family.  Follow-up care is a key part of your treatment and safety. Be sure to make and go to all appointments, and call your doctor if you are having problems. It's also a good idea to know your test results and keep a list of the medicines you take.  How can you care for yourself at home?  ?? Do not eat too much sugar, fat, or fast foods. You can still have dessert and treats now and then. The goal is moderation.  ?? Start small to improve your eating habits. Pay attention to portion sizes, drink less juice and soda pop, and eat more fruits and vegetables.  ? Eat a healthy amount of food. A 3-ounce serving of meat, for example, is about the size of a deck of cards. Fill the rest of your plate with vegetables and whole grains.  ? Limit the amount of soda and sports drinks you have every day. Drink more water when you are thirsty.  ? Eat at least 5 servings of fruits and vegetables every day. It may seem like a lot, but it is not hard to reach this goal. A serving or helping is 1 piece of fruit, 1 cup of vegetables, or 2 cups of leafy, raw vegetables. Have an apple or some carrot sticks as an afternoon snack instead of a candy bar. Try to have fruits and/or vegetables at every meal.  ?? Make exercise part of your daily routine. You may want to start with simple activities, such as walking, bicycling, or slow swimming. Try to be active 30 to 60 minutes every day. You do not need to do all 30 to 60  minutes all at once. For example, you can exercise 3 times a day for 10 or 20 minutes. Moderate exercise is safe for most people, but it is always a good idea to talk to your doctor before starting an exercise program.  ?? Keep moving. Mow the lawn, work in the garden, or clean your house. Take the stairs instead of the elevator at work.  ?? If you smoke, quit. People who smoke have an increased risk for heart attack, stroke, cancer, and other lung illnesses. Quitting is hard, but there are ways to boost your chance of quitting tobacco for good.  ? Use nicotine gum, patches, or lozenges.  ? Ask your doctor about stop-smoking programs and medicines.  ? Keep trying.  In addition to reducing your risk of diseases in the future, you will notice some benefits soon after you stop using tobacco. If you have shortness of breath or asthma symptoms, they will likely get better within a few weeks after you quit.  ?? Limit how much alcohol you drink. Moderate amounts of alcohol (up to 2 drinks a day for men, 1 drink a day for women) are okay. But drinking too much can lead to liver problems, high blood pressure, and other health problems.  Family health  If you have a family, there are many things   you can do together to improve your health.  ?? Eat meals together as a family as often as possible.  ?? Eat healthy foods. This includes fruits, vegetables, lean meats and dairy, and whole grains.  ?? Include your family in your fitness plan. Most people think of activities such as jogging or tennis as the way to fitness, but there are many ways you and your family can be more active. Anything that makes you breathe hard and gets your heart pumping is exercise. Here are some tips:  ? Walk to do errands or to take your child to school or the bus.  ? Go for a family bike ride after dinner instead of watching TV.  Where can you learn more?  Go to https://www.healthwise.net/GoodHelpConnections   Enter U807 in the search box to learn more about "A Healthy Lifestyle: Care Instructions."  Current as of: December 22, 2018??????????????????????????????Content Version: 12.6  ?? 2006-2020 Healthwise, Incorporated.   Care instructions adapted under license by [employer] (which disclaims liability or warranty for this information). If you have questions about a medical condition or this instruction, always ask your healthcare professional. Healthwise, Incorporated disclaims any warranty or liability for your use of this information.

## 2019-10-25 ENCOUNTER — Ambulatory Visit: Attending: Family Medicine | Primary: Family Medicine

## 2019-10-25 ENCOUNTER — Ambulatory Visit
Admit: 2019-10-25 | Discharge: 2019-10-25 | Payer: PRIVATE HEALTH INSURANCE | Attending: Family Medicine | Primary: Family Medicine

## 2019-10-25 DIAGNOSIS — N946 Dysmenorrhea, unspecified: Secondary | ICD-10-CM

## 2019-10-25 LAB — AMB POC URINE PREGNANCY TEST, VISUAL COLOR COMPARISON
HCG urine, Ql. (POC): NEGATIVE
HCG, Pregnancy, Urine, POC: NEGATIVE

## 2019-10-25 MED ORDER — MEDROXYPROGESTERONE (DEPO-PROVERA) 150 MG/ML IM SYRINGE
150 mg/mL | Freq: Once | INTRAMUSCULAR | 0 refills | Status: AC
Start: 2019-10-25 — End: 2019-10-25

## 2019-10-25 MED ORDER — MEDROXYPROGESTERONE 150 MG/ML INTRAMUSCULAR SUSPENSION
150 mg/mL | Freq: Once | INTRAMUSCULAR | 0 refills | Status: AC
Start: 2019-10-25 — End: 2019-10-25

## 2019-10-25 NOTE — Progress Notes (Signed)
Nurse only visit

## 2019-10-25 NOTE — Progress Notes (Signed)
 Chief Complaint   Patient presents with   . Depo     Patient presents in office today for NV only for depo.  This is her first injection after stopping oral contraceptives.  UPT ordered.

## 2019-10-25 NOTE — Progress Notes (Signed)
Chief Complaint   Patient presents with   ??? Depo     Patient presents in office today for NV only for depo.  This is her first injection after stopping oral contraceptives.  UPT ordered.

## 2019-10-25 NOTE — Patient Instructions (Signed)
A Healthy Lifestyle: Care Instructions  Your Care Instructions     A healthy lifestyle can help you feel good, stay at a healthy weight, and have plenty of energy for both work and play. A healthy lifestyle is something you can share with your whole family.  A healthy lifestyle also can lower your risk for serious health problems, such as high blood pressure, heart disease, and diabetes.  You can follow a few steps listed below to improve your health and the health of your family.  Follow-up care is a key part of your treatment and safety. Be sure to make and go to all appointments, and call your doctor if you are having problems. It's also a good idea to know your test results and keep a list of the medicines you take.  How can you care for yourself at home?  ?? Do not eat too much sugar, fat, or fast foods. You can still have dessert and treats now and then. The goal is moderation.  ?? Start small to improve your eating habits. Pay attention to portion sizes, drink less juice and soda pop, and eat more fruits and vegetables.  ? Eat a healthy amount of food. A 3-ounce serving of meat, for example, is about the size of a deck of cards. Fill the rest of your plate with vegetables and whole grains.  ? Limit the amount of soda and sports drinks you have every day. Drink more water when you are thirsty.  ? Eat at least 5 servings of fruits and vegetables every day. It may seem like a lot, but it is not hard to reach this goal. A serving or helping is 1 piece of fruit, 1 cup of vegetables, or 2 cups of leafy, raw vegetables. Have an apple or some carrot sticks as an afternoon snack instead of a candy bar. Try to have fruits and/or vegetables at every meal.  ?? Make exercise part of your daily routine. You may want to start with simple activities, such as walking, bicycling, or slow swimming. Try to be active 30 to 60 minutes every day. You do not need to do all 30 to 60  minutes all at once. For example, you can exercise 3 times a day for 10 or 20 minutes. Moderate exercise is safe for most people, but it is always a good idea to talk to your doctor before starting an exercise program.  ?? Keep moving. Mow the lawn, work in the garden, or clean your house. Take the stairs instead of the elevator at work.  ?? If you smoke, quit. People who smoke have an increased risk for heart attack, stroke, cancer, and other lung illnesses. Quitting is hard, but there are ways to boost your chance of quitting tobacco for good.  ? Use nicotine gum, patches, or lozenges.  ? Ask your doctor about stop-smoking programs and medicines.  ? Keep trying.  In addition to reducing your risk of diseases in the future, you will notice some benefits soon after you stop using tobacco. If you have shortness of breath or asthma symptoms, they will likely get better within a few weeks after you quit.  ?? Limit how much alcohol you drink. Moderate amounts of alcohol (up to 2 drinks a day for men, 1 drink a day for women) are okay. But drinking too much can lead to liver problems, high blood pressure, and other health problems.  Family health  If you have a family, there are many things   you can do together to improve your health.  ?? Eat meals together as a family as often as possible.  ?? Eat healthy foods. This includes fruits, vegetables, lean meats and dairy, and whole grains.  ?? Include your family in your fitness plan. Most people think of activities such as jogging or tennis as the way to fitness, but there are many ways you and your family can be more active. Anything that makes you breathe hard and gets your heart pumping is exercise. Here are some tips:  ? Walk to do errands or to take your child to school or the bus.  ? Go for a family bike ride after dinner instead of watching TV.  Where can you learn more?  Go to https://www.healthwise.net/GoodHelpConnections   Enter U807 in the search box to learn more about "A Healthy Lifestyle: Care Instructions."  Current as of: December 22, 2018??????????????????????????????Content Version: 12.6  ?? 2006-2020 Healthwise, Incorporated.   Care instructions adapted under license by [employer] (which disclaims liability or warranty for this information). If you have questions about a medical condition or this instruction, always ask your healthcare professional. Healthwise, Incorporated disclaims any warranty or liability for your use of this information.

## 2020-01-02 LAB — COVID-19: SARS-CoV-2, NAA: POSITIVE

## 2020-01-02 LAB — NOVEL CORONAVIRUS (COVID-19): SARS-CoV-2, NAA: POSITIVE

## 2020-01-31 NOTE — Telephone Encounter (Signed)
-----   Message from Marcy Salvo sent at 01/31/2020  7:49 AM EST -----  Regarding: [place]/telephone  Pt is requesting an appointment to have a depo shot.Pts number is [phone].

## 2020-01-31 NOTE — Telephone Encounter (Signed)
Returned call and spoke with pt and made office visit for 3/18.

## 2020-02-07 ENCOUNTER — Ambulatory Visit: Attending: Family Medicine | Primary: Family Medicine

## 2020-02-07 ENCOUNTER — Ambulatory Visit
Admit: 2020-02-07 | Discharge: 2020-02-07 | Payer: PRIVATE HEALTH INSURANCE | Attending: Family Medicine | Primary: Family Medicine

## 2020-02-07 DIAGNOSIS — F419 Anxiety disorder, unspecified: Secondary | ICD-10-CM

## 2020-02-07 LAB — AMB POC URINE PREGNANCY TEST, VISUAL COLOR COMPARISON
HCG urine, Ql. (POC): NEGATIVE
HCG, Pregnancy, Urine, POC: NEGATIVE

## 2020-02-07 MED ORDER — HYDROXYZINE PAMOATE 25 MG CAP
25 mg | ORAL_CAPSULE | Freq: Three times a day (TID) | ORAL | 5 refills | Status: DC | PRN
Start: 2020-02-07 — End: 2021-06-10

## 2020-02-07 MED ORDER — MEDROXYPROGESTERONE (DEPO-PROVERA) 150 MG/ML IM SYRINGE
150 mg/mL | Freq: Once | INTRAMUSCULAR | 0 refills | Status: AC
Start: 2020-02-07 — End: 2020-02-07

## 2020-02-07 MED ORDER — MEDROXYPROGESTERONE 150 MG/ML INTRAMUSCULAR SUSPENSION
150 mg/mL | Freq: Once | INTRAMUSCULAR | 0 refills | Status: AC
Start: 2020-02-07 — End: 2020-02-07

## 2020-02-07 NOTE — Progress Notes (Signed)
Progress Note    she is a 34 y.o. year old female who presents for evalution.    Subjective:     Pt here for her depo inj for her dysmenorrhea and is working well.  Pt also with anxiety and has been worse with covid pandemic.  Would like Rx for vistaril tid prn.  Has used in past and worked well.      Reviewed PmHx, RxHx, FmHx, SocHx, AllgHx and updated and dated in the chart.    Review of Systems - negative except as listed above in the HPI    Objective:     Vitals:    02/07/20 1547   BP: 111/75   Pulse: (!) 101   Resp: 16   Temp: 97.7 ??F (36.5 ??C)   TempSrc: Temporal   SpO2: 96%   Weight: 177 lb (80.3 kg)   Height: 5\' 9"  (1.753 m)       Current Outpatient Medications   Medication Sig   ??? medroxyPROGESTERone (DEPO-PROVERA) 150 mg/mL syrg 1 mL by IntraMUSCular route once for 1 dose.   ??? medroxyPROGESTERone (DEPO-PROVERA) 150 mg/mL injection 1 mL by IntraMUSCular route once for 1 dose.   ??? hydrOXYzine pamoate (VISTARIL) 25 mg capsule Take 1 Cap by mouth three (3) times daily as needed for Anxiety.   ??? adalimumab (Humira) 40 mg/0.8 mL injection by SubCUTAneous route once.   ??? levonorgestrel-ethinyl estradiol (AVIANE, ALESSE, LESSINA) 0.1-20 mg-mcg tab Take 1 Tab by mouth daily.   ??? predniSONE (DELTASONE) 10 mg tablet Take 4 tablets daily oral for 3 days and then   Take 3 tabs daily oral for 3 days and then   Take 2 tablets daily for 3 days and then   Take 1 tablet daily for 3 days and stop     No current facility-administered medications for this visit.        Physical Examination: General appearance - alert, well appearing, and in no distress  Mental status - alert, oriented to person, place, and time      Assessment/ Plan:   Diagnoses and all orders for this visit:    1. Anxiety  -     hydrOXYzine pamoate (VISTARIL) 25 mg capsule; Take 1 Cap by mouth three (3) times daily as needed for Anxiety.    2. Dysmenorrhea  -     medroxyPROGESTERone (DEPO-PROVERA) 150 mg/mL syrg; 1 mL by IntraMUSCular route once for  1 dose.  -     medroxyPROGESTERone (DEPO-PROVERA) 150 mg/mL injection; 1 mL by IntraMUSCular route once for 1 dose.  -     PR MEDROXYPROGESTERONE ACETATE, 1MG   -     PR THER/PROPH/DIAG INJECTION, SUBCUT/IM    3. Depo-Provera contraceptive status  -     AMB POC URINE PREGNANCY TEST, VISUAL COLOR COMPARISON       Follow-up and Dispositions    ?? Return in about 3 months (around 05/09/2020), or if symptoms worsen or fail to improve.           I have discussed the diagnosis with the patient and the intended plan as seen in the above orders.  The patient has received an after-visit summary and questions were answered concerning future plans. Pt conveyed understanding of plan.    Medication Side Effects and Warnings were discussed with patient    An electronic signature was used to authenticate this note  , DO

## 2020-02-07 NOTE — Progress Notes (Signed)
Chief Complaint   Patient presents with   . Medication Refill     hydroxyzine     Patient presents in office today for med refill of hydroxyzine.  Also her for depo.  No concerns    1. Have you been to the ER, urgent care clinic since your last visit?  Hospitalized since your last visit?No    2. Have you seen or consulted any other health care providers outside of the [facility] since your last visit?  Include any pap smears or colon screening. No    .  Learning Assessment 12/06/2014   PRIMARY LEARNER Patient   HIGHEST LEVEL OF EDUCATION - PRIMARY LEARNER  GRADUATED HIGH SCHOOL OR GED   BARRIERS PRIMARY LEARNER NONE   CO-LEARNER CAREGIVER No   PRIMARY LANGUAGE ENGLISH   LEARNER PREFERENCE PRIMARY READING   ANSWERED BY Patient   RELATIONSHIP SELF

## 2020-02-07 NOTE — Patient Instructions (Signed)
A Healthy Lifestyle: Care Instructions  Your Care Instructions     A healthy lifestyle can help you feel good, stay at a healthy weight, and have plenty of energy for both work and play. A healthy lifestyle is something you can share with your whole family.  A healthy lifestyle also can lower your risk for serious health problems, such as high blood pressure, heart disease, and diabetes.  You can follow a few steps listed below to improve your health and the health of your family.  Follow-up care is a key part of your treatment and safety. Be sure to make and go to all appointments, and call your doctor if you are having problems. It's also a good idea to know your test results and keep a list of the medicines you take.  How can you care for yourself at home?  ?? Do not eat too much sugar, fat, or fast foods. You can still have dessert and treats now and then. The goal is moderation.  ?? Start small to improve your eating habits. Pay attention to portion sizes, drink less juice and soda pop, and eat more fruits and vegetables.  ? Eat a healthy amount of food. A 3-ounce serving of meat, for example, is about the size of a deck of cards. Fill the rest of your plate with vegetables and whole grains.  ? Limit the amount of soda and sports drinks you have every day. Drink more water when you are thirsty.  ? Eat at least 5 servings of fruits and vegetables every day. It may seem like a lot, but it is not hard to reach this goal. A serving or helping is 1 piece of fruit, 1 cup of vegetables, or 2 cups of leafy, raw vegetables. Have an apple or some carrot sticks as an afternoon snack instead of a candy bar. Try to have fruits and/or vegetables at every meal.  ?? Make exercise part of your daily routine. You may want to start with simple activities, such as walking, bicycling, or slow swimming. Try to be active 30 to 60 minutes every day. You do not need to do all 30 to 60 minutes all at once. For example, you can  exercise 3 times a day for 10 or 20 minutes. Moderate exercise is safe for most people, but it is always a good idea to talk to your doctor before starting an exercise program.  ?? Keep moving. Mow the lawn, work in the garden, or clean your house. Take the stairs instead of the elevator at work.  ?? If you smoke, quit. People who smoke have an increased risk for heart attack, stroke, cancer, and other lung illnesses. Quitting is hard, but there are ways to boost your chance of quitting tobacco for good.  ? Use nicotine gum, patches, or lozenges.  ? Ask your doctor about stop-smoking programs and medicines.  ? Keep trying.  In addition to reducing your risk of diseases in the future, you will notice some benefits soon after you stop using tobacco. If you have shortness of breath or asthma symptoms, they will likely get better within a few weeks after you quit.  ?? Limit how much alcohol you drink. Moderate amounts of alcohol (up to 2 drinks a day for men, 1 drink a day for women) are okay. But drinking too much can lead to liver problems, high blood pressure, and other health problems.  Family health  If you have a family, there are many things   you can do together to improve your health.  ?? Eat meals together as a family as often as possible.  ?? Eat healthy foods. This includes fruits, vegetables, lean meats and dairy, and whole grains.  ?? Include your family in your fitness plan. Most people think of activities such as jogging or tennis as the way to fitness, but there are many ways you and your family can be more active. Anything that makes you breathe hard and gets your heart pumping is exercise. Here are some tips:  ? Walk to do errands or to take your child to school or the bus.  ? Go for a family bike ride after dinner instead of watching TV.  Where can you learn more?  Go to https://www.healthwise.net/[employer]  Enter U807 in the search box to learn more about "A Healthy Lifestyle: Care Instructions."   Current as of: December 22, 2018??????????????????????????????Content Version: 12.6  ?? 2006-2020 Healthwise[employer].   Care instructions adapted under license by [employer] (which disclaims liability or warranty for this information). If you have questions about a medical condition or this instruction, always ask your healthcare professional. Healthwise, Incorporated disclaims any warranty or liability for your use of this information.

## 2020-02-07 NOTE — Progress Notes (Signed)
Progress Note    she is a 34 y.o. year old female who presents for evalution.    Subjective:     Pt here for her depo inj for her dysmenorrhea and is working well.  Pt also with anxiety and has been worse with covid pandemic.  Would like Rx for vistaril tid prn.  Has used in past and worked well.      Reviewed PmHx, RxHx, FmHx, SocHx, AllgHx and updated and dated in the chart.    Review of Systems - negative except as listed above in the HPI    Objective:     Vitals:    02/07/20 1547   BP: 111/75   Pulse: (!) 101   Resp: 16   Temp: 97.7 ??F (36.5 ??C)   TempSrc: Temporal   SpO2: 96%   Weight: 177 lb (80.3 kg)   Height: 5' 9" (1.753 m)       Current Outpatient Medications   Medication Sig   ??? medroxyPROGESTERone (DEPO-PROVERA) 150 mg/mL syrg 1 mL by IntraMUSCular route once for 1 dose.   ??? medroxyPROGESTERone (DEPO-PROVERA) 150 mg/mL injection 1 mL by IntraMUSCular route once for 1 dose.   ??? hydrOXYzine pamoate (VISTARIL) 25 mg capsule Take 1 Cap by mouth three (3) times daily as needed for Anxiety.   ??? adalimumab (Humira) 40 mg/0.8 mL injection by SubCUTAneous route once.   ??? levonorgestrel-ethinyl estradiol (AVIANE, ALESSE, LESSINA) 0.1-20 mg-mcg tab Take 1 Tab by mouth daily.   ??? predniSONE (DELTASONE) 10 mg tablet Take 4 tablets daily oral for 3 days and then   Take 3 tabs daily oral for 3 days and then   Take 2 tablets daily for 3 days and then   Take 1 tablet daily for 3 days and stop     No current facility-administered medications for this visit.        Physical Examination: General appearance - alert, well appearing, and in no distress  Mental status - alert, oriented to person, place, and time      Assessment/ Plan:   Diagnoses and all orders for this visit:    1. Anxiety  -     hydrOXYzine pamoate (VISTARIL) 25 mg capsule; Take 1 Cap by mouth three (3) times daily as needed for Anxiety.    2. Dysmenorrhea  -     medroxyPROGESTERone (DEPO-PROVERA) 150 mg/mL syrg; 1 mL by IntraMUSCular route once for  1 dose.  -     medroxyPROGESTERone (DEPO-PROVERA) 150 mg/mL injection; 1 mL by IntraMUSCular route once for 1 dose.  -     PR MEDROXYPROGESTERONE ACETATE, 1MG  -     PR THER/PROPH/DIAG INJECTION, SUBCUT/IM    3. Depo-Provera contraceptive status  -     AMB POC URINE PREGNANCY TEST, VISUAL COLOR COMPARISON       Follow-up and Dispositions    ?? Return in about 3 months (around 05/09/2020), or if symptoms worsen or fail to improve.           I have discussed the diagnosis with the patient and the intended plan as seen in the above orders.  The patient has received an after-visit summary and questions were answered concerning future plans. Pt conveyed understanding of plan.    Medication Side Effects and Warnings were discussed with patient    An electronic signature was used to authenticate this note  Vermon Grays H Anniece Bleiler, DO

## 2020-02-07 NOTE — Progress Notes (Signed)
Chief Complaint   Patient presents with   ??? Medication Refill     hydroxyzine     Patient presents in office today for med refill of hydroxyzine.  Also her for depo.  No concerns    1. Have you been to the ER, urgent care clinic since your last visit?  Hospitalized since your last visit?No    2. Have you seen or consulted any other health care providers outside of the [facility] since your last visit?  Include any pap smears or colon screening. No    .  Learning Assessment 12/06/2014   PRIMARY LEARNER Patient   HIGHEST LEVEL OF EDUCATION - PRIMARY LEARNER  GRADUATED HIGH SCHOOL OR GED   BARRIERS PRIMARY LEARNER NONE   CO-LEARNER CAREGIVER No   PRIMARY LANGUAGE ENGLISH   LEARNER PREFERENCE PRIMARY READING   ANSWERED BY Patient   RELATIONSHIP SELF

## 2020-03-17 LAB — COVID-19: SARS-CoV-2, NAA: NOT DETECTED

## 2020-03-17 LAB — NOVEL CORONAVIRUS (COVID-19): SARS-CoV-2, NAA: NOT DETECTED

## 2020-03-25 ENCOUNTER — Inpatient Hospital Stay: Admit: 2020-03-25 | Payer: PRIVATE HEALTH INSURANCE | Primary: Family Medicine

## 2020-03-25 ENCOUNTER — Encounter

## 2020-03-25 DIAGNOSIS — Z01812 Encounter for preprocedural laboratory examination: Secondary | ICD-10-CM

## 2020-03-26 LAB — COVID-19: SARS-CoV-2: NOT DETECTED

## 2020-03-26 LAB — NOVEL CORONAVIRUS (COVID-19): SARS-CoV-2: NOT DETECTED

## 2020-03-28 ENCOUNTER — Inpatient Hospital Stay: Payer: PRIVATE HEALTH INSURANCE

## 2020-03-28 LAB — HCG URINE, QL. - POC
HCG, Pregnancy, Urine, POC: NEGATIVE
Pregnancy test,urine (POC): NEGATIVE

## 2020-03-28 MED ORDER — NALOXONE 0.4 MG/ML INJECTION
0.4 mg/mL | INTRAMUSCULAR | Status: DC | PRN
Start: 2020-03-28 — End: 2020-03-28

## 2020-03-28 MED ORDER — PROPOFOL 10 MG/ML IV EMUL
10 mg/mL | INTRAVENOUS | Status: AC
Start: 2020-03-28 — End: ?

## 2020-03-28 MED ORDER — ATROPINE 0.1 MG/ML SYRINGE
0.1 mg/mL | Freq: Once | INTRAMUSCULAR | Status: DC | PRN
Start: 2020-03-28 — End: 2020-03-28

## 2020-03-28 MED ORDER — SODIUM CHLORIDE 0.9 % IV
INTRAVENOUS | Status: DC | PRN
Start: 2020-03-28 — End: 2020-03-28
  Administered 2020-03-28: 12:00:00 via INTRAVENOUS

## 2020-03-28 MED ORDER — SIMETHICONE 40 MG/0.6 ML ORAL DROPS, SUSP
40 mg/0.6 mL | ORAL | Status: DC | PRN
Start: 2020-03-28 — End: 2020-03-28
  Administered 2020-03-28: 12:00:00 via ORAL

## 2020-03-28 MED ORDER — SODIUM CHLORIDE 0.9 % IV
INTRAVENOUS | Status: DC
Start: 2020-03-28 — End: 2020-03-28

## 2020-03-28 MED ORDER — PROPOFOL 10 MG/ML IV EMUL
10 mg/mL | INTRAVENOUS | Status: DC | PRN
Start: 2020-03-28 — End: 2020-03-28
  Administered 2020-03-28 (×9): via INTRAVENOUS

## 2020-03-28 MED ORDER — SODIUM CHLORIDE 0.9 % IJ SYRG
INTRAMUSCULAR | Status: DC | PRN
Start: 2020-03-28 — End: 2020-03-28

## 2020-03-28 MED ORDER — FLUMAZENIL 0.1 MG/ML IV SOLN
0.1 mg/mL | INTRAVENOUS | Status: DC | PRN
Start: 2020-03-28 — End: 2020-03-28

## 2020-03-28 MED ORDER — LIDOCAINE (PF) 20 MG/ML (2 %) IJ SOLN
20 mg/mL (2 %) | INTRAMUSCULAR | Status: DC | PRN
Start: 2020-03-28 — End: 2020-03-28
  Administered 2020-03-28: 12:00:00 via INTRAVENOUS

## 2020-03-28 MED ORDER — EPINEPHRINE 0.1 MG/ML SYRINGE
0.1 mg/mL | Freq: Once | INTRAMUSCULAR | Status: DC | PRN
Start: 2020-03-28 — End: 2020-03-28

## 2020-03-28 MED ORDER — SODIUM CHLORIDE 0.9 % IJ SYRG
Freq: Three times a day (TID) | INTRAMUSCULAR | Status: DC
Start: 2020-03-28 — End: 2020-03-28

## 2020-03-28 MED FILL — NORMAL SALINE FLUSH 0.9 % INJECTION SYRINGE: INTRAMUSCULAR | Qty: 40

## 2020-03-28 MED FILL — DIPRIVAN 10 MG/ML INTRAVENOUS EMULSION: 10 mg/mL | INTRAVENOUS | Qty: 500

## 2020-03-28 MED FILL — INFANTS GAS RELIEF 40 MG/0.6 ML ORAL DROPS,SUSPENSION: 40 mg/0.6 mL | ORAL | Qty: 60

## 2020-03-28 MED FILL — SODIUM CHLORIDE 0.9 % IV: INTRAVENOUS | Qty: 1000

## 2020-03-28 NOTE — Anesthesia Post-Procedure Evaluation (Signed)
Post-Anesthesia Evaluation and Assessment    Patient: Denise Huber MRN: [id number]  SSN: [id number]    Date of Birth: 1986/03/02  Age: 34 y.o.  Sex: female      I have evaluated the patient and they are stable and ready for discharge from the PACU.     Cardiovascular Function/Vital Signs  Visit Vitals  BP (!) 86/50   Pulse 87   Temp 36.9 ??C (98.4 ??F)   Resp 21   Ht 5' 9.5" (1.765 m)   Wt 84.4 kg (186 lb)   SpO2 100%   BMI 27.07 kg/m??       Patient is status post MAC anesthesia for Procedure(s):  COLONOSCOPY   :-.    Nausea/Vomiting: None    Postoperative hydration reviewed and adequate.    Pain:      Managed    Neurological Status:       At baseline    Mental Status, Level of Consciousness: Alert and  oriented to person, place, and time    Pulmonary Status:   O2 Device: CO2 nasal cannula (03/28/20 0828)   Adequate oxygenation and airway patent    Complications related to anesthesia: None    Post-anesthesia assessment completed. No concerns    Signed By: Eber Jones, MD     Mar 28, 2020              Procedure(s):  COLONOSCOPY   :-.    MAC    <BSHSIANPOST>    INITIAL Post-op Vital signs:   Vitals Value Taken Time   BP 102/66 03/28/20 0845   Temp     Pulse 87 03/28/20 0849   Resp 22 03/28/20 0849   SpO2 97 % 03/28/20 0849   Vitals shown include unvalidated device data.

## 2020-03-28 NOTE — Interval H&P Note (Signed)
Initial RN admission and assessment performed and documented in Endoscopy navigator.     Patient evaluated by anesthesia in pre-procedure holding.     All procedural vital signs, airway assessment, and level of consciousness information monitored and recorded by anesthesia staff on the anesthesia record.     Report received from CRNA post procedure.  Patient transported to recovery area by RN.    Endoscope was pre-cleaned at bedside immediately following procedure by Joe.

## 2020-03-28 NOTE — Procedures (Signed)
Procedures  by Aline Brochure, MD at 03/28/20 0830                Author: Aline Brochure, MD  Service: Gastroenterology  Author Type: Physician       Filed: 03/28/20 0832  Date of Service: 03/28/20 0830  Status: Signed          Editor: Aline Brochure, MD (Physician)            Pre-procedure Diagnoses        1. Crohn's disease of colon with complication ([place]) [Y69.485]                           Post-procedure Diagnoses        1. Crohn's disease of colon with complication ([place]) [I62.703]        2. Internal hemorrhoids without complication [J00.9]                           Procedures        1. [facility]   [place], [place]   [phone]                                      Colonoscopy Procedure Note         Indications:    Crohn's colitis (screening colon too)       Operator:  Aline Brochure, MD      Surgical Assistant: Endoscopy Technician-1: Roselee Culver IV   Endoscopy RN-1: Duffy Rhody, RN      Implants: none      Referring Provider: Esau Grew, DO      Sedation:  MAC anesthesia      Procedure Details:  After informed consent was obtained with all risks and benefits of procedure explained and preoperative exam completed,  the patient was taken to the endoscopy suite and placed in the left lateral decubitus position.  Upon sequential sedation as per above, a digital rectal exam was performed  And was normal.  The Olympus videocolonoscope  was inserted in the rectum and  carefully advanced to the cecum, which was identified by the ileocecal valve and appendiceal orifice.  The quality of preparation was good.   The colonoscope was slowly withdrawn with careful evaluation between folds. Retroflexion in the rectum was performed and was normal..       Findings:    Rectum: no mucosal lesion appreciated   Grade 2 internal and external hemorrhoid(s);   Sigmoid: no  mucosal lesion appreciated   Descending Colon: no mucosal lesion appreciated   Transverse Colon: no mucosal lesion appreciated   Ascending Colon: Mild congestion and a healing ulcer about 4 mm in size was noted in the proximal ascending colon.  Rest of the ascending colon  and cecum has healed really well.   Cecum: Mild diffuse congestion with loss of vascular pattern.  No ulceration or erythema was noted   Terminal Ileum: not intubated      Interventions:  none      Specimen Removed:  * No specimens in log *  Complications: None.       EBL:  None.      Recommendations:    -Regular diet.   -Repeat colonoscopy in 2 years      -Resume normal medication(s).    -Advice on smoking cessation      Discharge Disposition:  Home in the company of a driver when able to ambulate.      Aline Brochure, MD   03/28/2020  8:30 AM
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
## 2020-03-28 NOTE — Anesthesia Pre-Procedure Evaluation (Signed)
Relevant Problems   No relevant active problems       Anesthetic History   No history of anesthetic complications            Review of Systems / Medical History  Patient summary reviewed, nursing notes reviewed and pertinent labs reviewed    Pulmonary  Within defined limits                 Neuro/Psych   Within defined limits           Cardiovascular  Within defined limits                     GI/Hepatic/Renal  Within defined limits              Endo/Other  Within defined limits           Other Findings   Comments: Crohn's           Physical Exam    Airway  Mallampati: I  TM Distance: > 6 cm  Neck ROM: normal range of motion   Mouth opening: Normal     Cardiovascular  Regular rate and rhythm,  S1 and S2 normal,  no murmur, click, rub, or gallop             Dental  No notable dental hx       Pulmonary  Breath sounds clear to auscultation               Abdominal  GI exam deferred       Other Findings            Anesthetic Plan    ASA: 2  Anesthesia type: MAC            Anesthetic plan and risks discussed with: Patient
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
## 2020-03-28 NOTE — Anesthesia Post-Procedure Evaluation (Signed)
Post-Anesthesia Evaluation and Assessment    Patient: Denise Huber MRN: [id number]  SSN: [id number]    Date of Birth: 07/18/1986  Age: 34 y.o.  Sex: female      I have evaluated the patient and they are stable and ready for discharge from the PACU.     Cardiovascular Function/Vital Signs  Visit Vitals  BP (!) 86/50   Pulse 87   Temp 36.9 ??C (98.4 ??F)   Resp 21   Ht 5' 9.5" (1.765 m)   Wt 84.4 kg (186 lb)   SpO2 100%   BMI 27.07 kg/m??       Patient is status post MAC anesthesia for Procedure(s):  COLONOSCOPY   :-.    Nausea/Vomiting: None    Postoperative hydration reviewed and adequate.    Pain:      Managed    Neurological Status:       At baseline    Mental Status, Level of Consciousness: Alert and  oriented to person, place, and time    Pulmonary Status:   O2 Device: CO2 nasal cannula (03/28/20 0828)   Adequate oxygenation and airway patent    Complications related to anesthesia: None    Post-anesthesia assessment completed. No concerns    Signed By: Elexia Friedt C Dainelle Hun, MD     Mar 28, 2020              Procedure(s):  COLONOSCOPY   :-.    MAC    <BSHSIANPOST>    INITIAL Post-op Vital signs:   Vitals Value Taken Time   BP 102/66 03/28/20 0845   Temp     Pulse 87 03/28/20 0849   Resp 22 03/28/20 0849   SpO2 97 % 03/28/20 0849   Vitals shown include unvalidated device data.

## 2020-03-28 NOTE — Procedures (Signed)
[facility]  [address] [place], [place]  [phone]                              Colonoscopy Procedure Note      Indications:    Crohn's colitis (screening colon too)     Operator:  Aline Brochure, MD    Surgical Assistant: Endoscopy Technician-1: Roselee Culver IV  Endoscopy RN-1: Duffy Rhody, RN    Implants: none    Referring Provider: Esau Grew, DO    Sedation:  MAC anesthesia    Procedure Details:  After informed consent was obtained with all risks and benefits of procedure explained and preoperative exam completed, the patient was taken to the endoscopy suite and placed in the left lateral decubitus position.  Upon sequential sedation as per above, a digital rectal exam was performed  And was normal.  The Olympus videocolonoscope  was inserted in the rectum and carefully advanced to the cecum, which was identified by the ileocecal valve and appendiceal orifice.  The quality of preparation was good.  The colonoscope was slowly withdrawn with careful evaluation between folds. Retroflexion in the rectum was performed and was normal..     Findings:   Rectum: no mucosal lesion appreciated  Grade 2 internal and external hemorrhoid(s);  Sigmoid: no mucosal lesion appreciated  Descending Colon: no mucosal lesion appreciated  Transverse Colon: no mucosal lesion appreciated  Ascending Colon: Mild congestion and a healing ulcer about 4 mm in size was noted in the proximal ascending colon.  Rest of the ascending colon and cecum has healed really well.  Cecum: Mild diffuse congestion with loss of vascular pattern.  No ulceration or erythema was noted  Terminal Ileum: not intubated    Interventions:  none    Specimen Removed:  * No specimens in log *    Complications: None.     EBL:  None.    Recommendations:   -Regular diet.  -Repeat colonoscopy in 2 years     -Resume normal medication(s).   -Advice on smoking cessation    Discharge Disposition:  Home in the  company of a driver when able to ambulate.    Aline Brochure, MD  03/28/2020  8:30 AM

## 2020-03-28 NOTE — Other (Signed)
Report received from Kim VW RN

## 2020-03-28 NOTE — Anesthesia Pre-Procedure Evaluation (Signed)
Relevant Problems   No relevant active problems       Anesthetic History   No history of anesthetic complications            Review of Systems / Medical History  Patient summary reviewed, nursing notes reviewed and pertinent labs reviewed    Pulmonary  Within defined limits                 Neuro/Psych   Within defined limits           Cardiovascular  Within defined limits                     GI/Hepatic/Renal  Within defined limits              Endo/Other  Within defined limits           Other Findings   Comments: Crohn's           Physical Exam    Airway  Mallampati: I  TM Distance: > 6 cm  Neck ROM: normal range of motion   Mouth opening: Normal     Cardiovascular  Regular rate and rhythm,  S1 and S2 normal,  no murmur, click, rub, or gallop             Dental  No notable dental hx       Pulmonary  Breath sounds clear to auscultation               Abdominal  GI exam deferred       Other Findings            Anesthetic Plan    ASA: 2  Anesthesia type: MAC            Anesthetic plan and risks discussed with: Patient

## 2020-03-28 NOTE — Other (Signed)
Initial RN admission and assessment performed and documented in Endoscopy navigator.     Patient evaluated by anesthesia in pre-procedure holding.     All procedural vital signs, airway assessment, and level of consciousness information monitored and recorded by anesthesia staff on the anesthesia record.     Report received from CRNA post procedure.  Patient transported to recovery area by RN.    Endoscope was pre-cleaned at bedside immediately following procedure by Joe.

## 2020-04-03 NOTE — Progress Notes (Signed)
Colonoscopy report received & put on Dr Darrell Jewel desk to process.

## 2020-04-03 NOTE — Progress Notes (Signed)
Colonoscopy report received & put on Dr Lobb's desk to process.

## 2020-05-01 ENCOUNTER — Ambulatory Visit: Attending: Family Medicine | Primary: Family Medicine

## 2020-05-01 ENCOUNTER — Ambulatory Visit
Admit: 2020-05-01 | Discharge: 2020-05-01 | Payer: PRIVATE HEALTH INSURANCE | Attending: Family Medicine | Primary: Family Medicine

## 2020-05-01 DIAGNOSIS — Z3042 Encounter for surveillance of injectable contraceptive: Secondary | ICD-10-CM

## 2020-05-01 MED ORDER — MEDROXYPROGESTERONE (DEPO-PROVERA) 150 MG/ML IM SYRINGE
150 mg/mL | INJECTION | Freq: Once | INTRAMUSCULAR | 0 refills | Status: AC
Start: 2020-05-01 — End: 2020-05-01

## 2020-05-01 NOTE — Progress Notes (Signed)
 Chief Complaint   Patient presents with   . Immunization/Injection     Patient presents in office today for NV only for depo.  Last injection was 02/07/20.  Schedule given for patient to return between 07/17/20-07/31/20 for next injection.

## 2020-05-01 NOTE — Progress Notes (Signed)
Nurse visit only

## 2020-06-20 LAB — COVID-19: SARS-CoV-2, NAA: NEGATIVE

## 2020-06-20 LAB — NOVEL CORONAVIRUS (COVID-19): SARS-CoV-2, NAA: NEGATIVE

## 2020-07-01 ENCOUNTER — Telehealth: Attending: Family Medicine | Primary: Family Medicine

## 2020-07-01 ENCOUNTER — Telehealth: Admit: 2020-07-01 | Payer: PRIVATE HEALTH INSURANCE | Attending: Family Medicine | Primary: Family Medicine

## 2020-07-01 DIAGNOSIS — K2970 Gastritis, unspecified, without bleeding: Secondary | ICD-10-CM

## 2020-07-01 MED ORDER — PANTOPRAZOLE 40 MG TAB, DELAYED RELEASE
40 mg | ORAL_TABLET | Freq: Every day | ORAL | 1 refills | Status: DC
Start: 2020-07-01 — End: 2021-01-28

## 2020-07-01 NOTE — Progress Notes (Signed)
Progress Note    she is a 34 y.o. year old female who presents for evalution.    Subjective:     Patient here for ER follow-up from last Thursday of 6 July was seen for Crohn's flare started on amoxicillin and metronidazole along with a 59-month tapering dose of prednisone.  She reports that the prednisone is making her stomach quite upset not currently on an acid reducer.  She does have an appoint with her GI doctor next week.  Discussed starting Protonix for now to help with GI prophylaxis.  Pt missed 29 July due to hospital stay, missed 2 days a couple months ago unsure of days and she will  Bring form to Gi for completion next week.        Patient also with depression states is currently active without any suicidal or homicidal ideation.  She has previously failed Wellbutrin, Zoloft, Cymbalta.  We discussed trial of another medication in the future.  However, given her current stomach upset and side effect profile of SSRIs she would like to hold off on this for now which I agree with.  She will follow up after she completes her prednisone treatment for discussion of initiation of another agent.    Reviewed PmHx, RxHx, FmHx, SocHx, AllgHx and updated and dated in the chart.    Review of Systems - negative except as listed above in the HPI    Objective:   There were no vitals filed for this visit.    Current Outpatient Medications   Medication Sig   ??? pantoprazole (PROTONIX) 40 mg tablet Take 1 Tablet by mouth daily.   ??? acetaminophen (TylenoL) 325 mg tablet Take 325 mg by mouth every four (4) hours as needed for Pain.   ??? hydrOXYzine pamoate (VISTARIL) 25 mg capsule Take 1 Cap by mouth three (3) times daily as needed for Anxiety.   ??? adalimumab (Humira) 40 mg/0.8 mL injection by SubCUTAneous route once.   ??? predniSONE (DELTASONE) 10 mg tablet Take 4 tablets daily oral for 3 days and then   Take 3 tabs daily oral for 3 days and then   Take 2 tablets daily for 3 days and then   Take 1 tablet daily for 3  days and stop     No current facility-administered medications for this visit.       Physical Examination: General appearance - alert, well appearing, and in no distress  Mental status - alert, oriented to person, place, and time      Assessment/ Plan:   Diagnoses and all orders for this visit:    1. Gastritis, presence of bleeding unspecified, unspecified chronicity, unspecified gastritis type  -     pantoprazole (PROTONIX) 40 mg tablet; Take 1 Tablet by mouth daily.    2. Moderate episode of recurrent major depressive disorder (HCC)  Follow-up after completion of prednisone to discuss initiation of medication  3. 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  She has follow-up with her GI doctor next Tuesday.  She has decided to hold off on her Depo-Provera until after her stomach is feeling better.  She is not currently sexually active.  Follow-up and Dispositions    ?? Return if symptoms worsen or fail to improve.           I have discussed the diagnosis with the patient and the intended plan as seen in the above orders.  The patient has  received an after-visit summary and questions were answered concerning future plans. Pt conveyed understanding of plan.    Medication Side Effects and Warnings were discussed with patient      Denise Huber is being evaluated by a Virtual Visit (video visit) encounter to address concerns as mentioned above.  A caregiver was present when appropriate. Due to this being a Scientist, research (medical) (During COVID-19 public health emergency), evaluation of the following organ systems was limited: Vitals/Constitutional/EENT/Resp/CV/GI/GU/MS/Neuro/Skin/Heme-Lymph-Imm.  Pursuant to the emergency declaration under the [facility] Act and the [employer], 1135 waiver authority and the [employer] Act, this Virtual Visit was conducted with patient's  (and/or legal guardian's) consent, to reduce the patient's risk of exposure to COVID-19 and provide necessary medical care.  The patient (and/or legal guardian) has also been advised to contact this office for worsening conditions or problems, and seek emergency medical treatment and/or call 911 if deemed necessary.    Patient identification was verified at the start of the visit: Yes    Services were provided through a video synchronous discussion virtually to substitute for in-person clinic visit. Patient and provider were located at their individual homes.  --Dietrich Pates, DO on 07/01/2020 at 10:42 AM

## 2020-07-04 NOTE — Progress Notes (Signed)
Ambulatory Care Management Note    Date/Time:  07/04/2020 12:48 PM    This patient was received as a referral from [facility] Report.  Ambulatory Care Manager outreached to patient today to offer care management services, no answer, left message to return telephone call.   ACM was unable to introduction self and role of care manager due to unable to contact patient..  Patient was not available to  care management services at this time.   No follow up call scheduled at this time.  Patient has Ambulatory Care Manager's contact number for for any questions or concerns.

## 2020-07-08 NOTE — Telephone Encounter (Signed)
Please call to schedule patient for a new patient appointment with [name] for chest pain.

## 2020-07-09 ENCOUNTER — Encounter

## 2020-07-10 NOTE — Progress Notes (Signed)
Ambulatory Care Management Note    Date/Time:  07/10/2020 10:54 AM    This patient was received as a referral from [facility] Report.  Ambulatory Care Manager outreached to patient today to offer care management services.   ACM was unable to contact patient in order to introduce self and role of care manager.  Patient was unable to be contacted for care management services at this time.   No follow up call scheduled at this time.  Patient has Ambulatory Care Manager's contact number for for any questions or concerns.

## 2020-07-10 NOTE — Telephone Encounter (Signed)
Future Appointments   Date Time Provider Department Center   07/30/2020  3:20 PM [name], MD [facility]

## 2020-07-17 ENCOUNTER — Encounter: Attending: Family Medicine | Primary: Family Medicine

## 2020-07-18 ENCOUNTER — Inpatient Hospital Stay: Payer: PRIVATE HEALTH INSURANCE | Attending: Gastroenterology | Primary: Family Medicine

## 2020-07-26 ENCOUNTER — Inpatient Hospital Stay: Admit: 2020-07-26 | Payer: PRIVATE HEALTH INSURANCE | Attending: Gastroenterology | Primary: Family Medicine

## 2020-07-26 DIAGNOSIS — K5090 Crohn's disease, unspecified, without complications: Secondary | ICD-10-CM

## 2020-07-26 MED ORDER — IOPAMIDOL 76 % IV SOLN
76 % | Freq: Once | INTRAVENOUS | Status: AC
Start: 2020-07-26 — End: 2020-07-26
  Administered 2020-07-26: 14:00:00 via INTRAVENOUS

## 2020-07-26 MED FILL — ISOVUE-370  76 % INTRAVENOUS SOLUTION: 370 mg iodine /mL (76 %) | INTRAVENOUS | Qty: 100

## 2020-07-30 ENCOUNTER — Ambulatory Visit: Attending: Cardiovascular Disease | Primary: Family Medicine

## 2020-07-30 ENCOUNTER — Ambulatory Visit
Admit: 2020-07-30 | Discharge: 2020-07-30 | Payer: PRIVATE HEALTH INSURANCE | Attending: Cardiovascular Disease | Primary: Family Medicine

## 2020-07-30 DIAGNOSIS — R079 Chest pain, unspecified: Secondary | ICD-10-CM

## 2020-07-30 NOTE — Progress Notes (Signed)
Progress  Notes by [name], MD at 07/30/20 1520                Author: [name], MD  Service: --  Author Type: Physician       Filed: 07/30/20 1618  Encounter Date: 07/30/2020  Status: Signed          Editor: [name], MD (Physician)                        [name]                                  [facility] [name]  [phone]               Cardiology Consult/Progress Note        Requesting/referring provider: Dietrich Pates, DO, Dr. Steva Ready      Reason for Consult: Chest pain      Assessment/Plan:   1.  Atypical chest pain nonexertional   2.  History of Crohn's disease on Humira   3.  History of tobacco abuse   4.  Obesity      Ms. Pha is seen for atypical chest pain.  With exertion she does not report of any significant shortness of breath or chest pain.  Overall her pretest probability of having CAD is pretty low.  Her EKG is unremarkable for ischemia.  Probability of  having significant coronary disease is low.  Nonetheless for chest pain I offered her to do an echocardiogram to rule out any underlying structural heart disease particularly in light of the fact that she is on Humira which can provoke heart failure symptoms.   Other than that she does not require any limitations from cardiac standpoint.      Investigations ordered: Echocardiogram      []     High complexity decision making was performed   []     Patient is at high-risk of decompensation  with multiple organ involvement   []     Complex/difficult social determinants of  health outcomes      Investigations personally reviewed and interpreted   ECG was personally reviewed from September 2021 and shows sinus arrhythmia but otherwise normal ECG.   Investigations reviewed      HPI: SORAIDA VICKERS, a  34 y.o. year-old who is seen for evaluation of and management of chest pain.  Chest pain is reported as upper left shoulder region pain with no exertional component.  Is often described as sharp pain.   No specific relieving  or propagating factors are reported.  She has history of asthma and Crohn's disease and is on Biologics for remission.  No prior history of cardiac disease during pregnancies.  No family history of premature coronary disease.   She  has a past medical history of Abnormal Pap smear, Anemia NEC, Asthma, Crohn's disease  (HCC), Depression, OTHER MEDICAL, Pap smear, as part of routine gynecological examination (03/24/2011), and Sickle-cell disease, unspecified. She also has no past medical history of Acquired hypothyroidism, Complication of anesthesia, Diabetes mellitus,  Essential hypertension, Genital herpes, unspecified, Heart abnormalities, Herpes gestationis, Herpes simplex without mention of complication, Human immunodeficiency virus (HIV) disease (HCC), Infertility, Kidney disease, Liver disease, Phlebitis and thrombophlebitis  of unspecified site, Postpartum depression, Rhesus isoimmunization unspecified as to episode of care in pregnancy, Systemic lupus erythematosus (HCC), Trauma, Unspecified breast disorder, Unspecified diseases of blood and  blood-forming organs, or Unspecified  epilepsy without mention of intractable epilepsy.   Review of system:Patient reports no dyspnea/PND/Orthpnea.She reports no cough/fever/focal neurological deficits.  Abdominal pain from Crohn's disease now getting better since she was given a steroid taper and reinitiated on Humira.  All other systems  negative except as above.      Family History         Problem  Relation  Age of Onset          ?  Cancer  Father             Social History          Socioeconomic History         ?  Marital status:  SINGLE              Spouse name:  Not on file         ?  Number of children:  Not on file     ?  Years of education:  Not on file     ?  Highest education level:  Not on file       Occupational History         ?  Occupation:  call center              Employer:  OTHER       Tobacco Use         ?  Smoking status:  Former Smoker               Packs/day:  0.25         Years:  3.00         Pack years:  0.75         Quit date:  10/20/2013         Years since quitting:  6.7         ?  Smokeless tobacco:  Never Used        ?  Tobacco comment: 4 cigs. a day       Substance and Sexual Activity         ?  Alcohol use:  No              Alcohol/week:  0.0 standard drinks         ?  Drug use:  No     ?  Sexual activity:  Yes              Partners:  Male              Birth control/protection:  None          Social Determinants of Financial controller Strain:         ?  Difficulty of Paying Living Expenses:        Food Insecurity:         ?  Worried About Programme researcher, broadcasting/film/video in the Last Year:      ?  Barista in the Last Year:        Transportation Needs:         ?  Freight forwarder (Medical):      ?  Lack of Transportation (Non-Medical):        Physical Activity:         ?  Days of Exercise per Week:      ?  Minutes of Exercise per Session:  Stress:         ?  Feeling of Stress :        Social Connections:         ?  Frequency of Communication with Friends and Family:      ?  Frequency of Social Gatherings with Friends and Family:      ?  Attends Religious Services:      ?  Active Member of Clubs or Organizations:      ?  Attends Banker Meetings:         ?  Marital Status:          PE     Vitals:          07/30/20 1517        BP:  112/62     Weight:  221 lb (100.2 kg)        Height:  5\' 9"  (1.753 m)      Body mass index is 32.64 kg/m??.         General:     Alert, cooperative, no distress.     Psychiatric:     Normal Mood and affect      Eye/ENT:       Pupils equal, No asymmetry, Conjunctival pink. Able to hear voice at normal amplitude     Lungs:       Visibly symmetric chest expansion, No palpable tenderness. Clear to auscultation bilaterally.      Heart::     Regular rate and rhythm, S1, S2 normal, no murmur, click, rub or gallop.No JVD, Normal palpable peripheral pulses. No cyanosis     Abdomen:      Soft,  non-tender. Bowel sounds normal. No masses,  No       organomegaly.     Extremities:    Extremities normal, atraumatic, no edema.        Neurologic:    CN II-XII grossly intact. No gross focal deficits                 Recent Labs:   No results found for: CHOL, CHOLX, CHLST, CHOLV, 884269, HDL, HDLP, LDL, LDLC, DLDLP, TGLX, TRIGL, TRIGP, CHHD, CHHDX     Lab Results         Component  Value  Date/Time            Creatinine  0.56  02/24/2019 04:23 AM          Lab Results         Component  Value  Date/Time            BUN  4 (L)  02/24/2019 04:23 AM          Lab Results         Component  Value  Date/Time            Potassium  3.8  02/24/2019 04:23 AM        No results found for: HBA1C, HBA1CEXT     Lab Results         Component  Value  Date/Time            HGB  10.0 (L)  02/24/2019 04:23 AM          Lab Results         Component  Value  Date/Time            PLATELET  410 (H)  02/24/2019 04:23 AM  Reviewed:     Past Medical History:        Diagnosis  Date         ?  Abnormal Pap smear            ASCUS 2010/LGSIL 2008/2010         ?  Anemia NEC       ?  Asthma            ALBUTEROL PRN         ?  Crohn's disease (HCC)       ?  Depression            DEPRESSION - CONTROLLED         ?  HX OTHER MEDICAL            CHLAMYDIA 2006, HPV         ?  Pap smear, as part of routine gynecological examination  03/24/2011          Dr. Carlye Grippe [facility]         ?  Sickle-cell disease, unspecified            TRAIT          Social History          Tobacco Use        Smoking Status  Former Smoker         ?  Packs/day:  0.25     ?  Years:  3.00     ?  Pack years:  0.75     ?  Quit date:  10/20/2013     ?  Years since quitting:  6.7        Smokeless Tobacco  Never Used       Tobacco Comment          4 cigs. a day          Social History          Substance and Sexual Activity        Alcohol Use  No         ?  Alcohol/week:  0.0 standard drinks          Allergies        Allergen  Reactions         ?  Lortab  [Hydrocodone-Acetaminophen]  Itching     ?  Other Medication  Other (comments)             cats          Family History         Problem  Relation  Age of Onset          ?  Cancer  Father                Current Outpatient Medications        Medication  Sig         ?  pantoprazole (PROTONIX) 40 mg tablet  Take 1 Tablet by mouth daily.     ?  acetaminophen (TylenoL) 325 mg tablet  Take 325 mg by mouth every four (4) hours as needed for Pain.     ?  hydrOXYzine pamoate (VISTARIL) 25 mg capsule  Take 1 Cap by mouth three (3) times daily as needed for Anxiety.     ?  adalimumab (Humira) 40 mg/0.8 mL injection  by SubCUTAneous route once.         ?  predniSONE (  DELTASONE) 10 mg tablet  Take 4 tablets daily oral for 3 days and then    Take 3 tabs daily oral for 3 days and then    Take 2 tablets daily for 3 days and then    Take 1 tablet daily for 3 days and stop (Patient not taking: Reported on 07/30/2020)          No current facility-administered medications for this visit.           [name], MD09/08/21       ATTENTION:    This medical record was transcribed using an electronic medical records/speech recognition system.  Although proofread, it may and can contain electronic, spelling and other errors.  Corrections may be executed at a later time.  Please feel free to  contact us for any clarifications as needed.      [facility]   [address],   [place], [place]. [phone]

## 2020-11-04 ENCOUNTER — Ambulatory Visit

## 2020-11-04 ENCOUNTER — Ambulatory Visit: Admit: 2020-11-04 | Discharge: 2020-11-04 | Payer: PRIVATE HEALTH INSURANCE | Primary: Family Medicine

## 2020-11-04 ENCOUNTER — Encounter: Attending: Cardiovascular Disease | Primary: Family Medicine

## 2020-11-04 DIAGNOSIS — R079 Chest pain, unspecified: Secondary | ICD-10-CM

## 2020-11-05 LAB — TRANSTHORACIC ECHOCARDIOGRAM (TTE) COMPLETE (CONTRAST/BUBBLE/3D PRN)
AV Area by Peak Velocity: 2 cm2
AV Area by Peak Velocity: 2 cm2
AV Area by VTI: 2.1 cm2
AV Area by VTI: 2.1 cm2
AV Mean Gradient: 5 mmHg
AV Mean Velocity: 1 m/s
AV Peak Gradient: 8 mmHg
AV Peak Velocity: 1.5 m/s
AV VTI: 27.4 cm
AV Velocity Ratio: 0.6
Ao Root Index: 1.11 cm/m2
Aortic Root: 2.4 cm
Ascending Aorta Index: 1.06 cm/m2
Ascending Aorta: 2.3 cm
E/E' Lateral: 5.35
E/E' Ratio (Averaged): 6.47
E/E' Septal: 7.58
EF BP: 56 % (ref 55–100)
EF BP: 56 % (ref 55–100)
Fractional Shortening 2D: 31 % (ref 28–44)
IVSd: 0.9 cm (ref 0.6–0.9)
LA Diameter: 3.7 cm
LA Size Index: 1.71 cm/m2
LA Volume 2C: 52 mL (ref 22–52)
LA Volume 4C: 47 mL (ref 22–52)
LA Volume A/L: 54 mL
LA Volume BP: 52 mL (ref 22–52)
LA Volume Index 2C: 24 mL/m2 (ref 16–28)
LA Volume Index 4C: 22 mL/m2 (ref 16–28)
LA Volume Index A/L: 25 mL/m2
LA Volume Index BP: 24 ml/m2 (ref 16–28)
LA/AO Root Ratio: 1.54
LV E' Lateral Velocity: 17 cm/s
LV E' Septal Velocity: 12 cm/s
LV EDV A2C: 105 mL
LV EDV A4C: 118 mL
LV EDV BP: 111 mL — AB (ref 56–104)
LV EDV Index A2C: 49 mL/m2
LV EDV Index A4C: 55 mL/m2
LV EDV Index BP: 51 mL/m2
LV ESV A2C: 46 mL
LV ESV A4C: 53 mL
LV ESV BP: 49 mL (ref 19–49)
LV ESV Index A2C: 21 mL/m2
LV ESV Index A4C: 25 mL/m2
LV ESV Index BP: 23 mL/m2
LV Ejection Fraction A2C: 57 %
LV Ejection Fraction A4C: 55 %
LV Mass 2D Index: 73.5 g/m2 (ref 43–95)
LV Mass 2D: 158.8 g (ref 67–162)
LV RWT Ratio: 0.42
LVIDd Index: 2.22 cm/m2
LVIDd: 4.8 cm (ref 3.9–5.3)
LVIDs Index: 1.53 cm/m2
LVIDs: 3.3 cm
LVOT Area: 3.1 cm2
LVOT Diameter: 2 cm
LVOT Mean Gradient: 2 mmHg
LVOT Peak Gradient: 3 mmHg
LVOT Peak Velocity: 0.9 m/s
LVOT SV: 58.1 ml
LVOT Stroke Volume Index: 26.9 mL/m2
LVOT VTI: 18.5 cm
LVOT:AV VTI Index: 0.68
LVPWd: 1 cm — AB (ref 0.6–0.9)
Left Ventricular Ejection Fraction: 58
MV A Velocity: 0.54 m/s
MV Area by PHT: 5.5 cm2
MV E Velocity: 0.91 m/s
MV E Wave Deceleration Time: 138.7 ms
MV E/A: 1.69
MV PHT: 40.2 ms
RA Major Axis Index: 1.67 cm/m2
RA Major Axis: 3.6 cm
RV Free Wall Peak S': 14 cm/s
RVIDd: 3.6 cm
TAPSE: 1.9 cm (ref 1.5–2)

## 2020-11-05 LAB — ECHO ADULT COMPLETE
AV Area by Peak Velocity: 2 cm2
AV Area by Peak Velocity: 2 cm2
AV Area by VTI: 2.1 cm2
AV Area by VTI: 2.1 cm2
AV Mean Gradient: 5 mmHg
AV Mean Velocity: 1 m/s
AV Peak Gradient: 8 mmHg
AV Peak Velocity: 1.5 m/s
AV VTI: 27.4 cm
AV Velocity Ratio: 0.6
Ao Root Index: 1.11 cm/m2
Aortic Root: 2.4 cm
Ascending Aorta Index: 1.06 cm/m2
Ascending Aorta: 2.3 cm
E/E' Lateral: 5.35
E/E' Ratio (Averaged): 6.47
E/E' Septal: 7.58
EF BP: 56 % (ref 55–100)
EF BP: 56 % (ref 55–100)
Fractional Shortening 2D: 31 % (ref 28–44)
IVSd: 0.9 cm (ref 0.6–0.9)
LA Diameter: 3.7 cm
LA Size Index: 1.71 cm/m2
LA Volume 2C: 52 mL (ref 22–52)
LA Volume 4C: 47 mL (ref 22–52)
LA Volume A/L: 54 mL
LA Volume BP: 52 mL (ref 22–52)
LA Volume Index 2C: 24 mL/m2 (ref 16–28)
LA Volume Index 4C: 22 mL/m2 (ref 16–28)
LA Volume Index A/L: 25 mL/m2
LA Volume Index BP: 24 ml/m2 (ref 16–28)
LA/AO Root Ratio: 1.54
LV E' Lateral Velocity: 17 cm/s
LV E' Septal Velocity: 12 cm/s
LV EDV A2C: 105 mL
LV EDV A4C: 118 mL
LV EDV BP: 111 mL — AB (ref 56–104)
LV EDV Index A2C: 49 mL/m2
LV EDV Index A4C: 55 mL/m2
LV EDV Index BP: 51 mL/m2
LV ESV A2C: 46 mL
LV ESV A4C: 53 mL
LV ESV BP: 49 mL (ref 19–49)
LV ESV Index A2C: 21 mL/m2
LV ESV Index A4C: 25 mL/m2
LV ESV Index BP: 23 mL/m2
LV Ejection Fraction A2C: 57 %
LV Ejection Fraction A4C: 55 %
LV Mass 2D Index: 73.5 g/m2 (ref 43–95)
LV Mass 2D: 158.8 g (ref 67–162)
LV RWT Ratio: 0.42
LVIDd Index: 2.22 cm/m2
LVIDd: 4.8 cm (ref 3.9–5.3)
LVIDs Index: 1.53 cm/m2
LVIDs: 3.3 cm
LVOT Area: 3.1 cm2
LVOT Diameter: 2 cm
LVOT Mean Gradient: 2 mmHg
LVOT Peak Gradient: 3 mmHg
LVOT Peak Velocity: 0.9 m/s
LVOT SV: 58.1 ml
LVOT Stroke Volume Index: 26.9 mL/m2
LVOT VTI: 18.5 cm
LVOT:AV VTI Index: 0.68
LVPWd: 1 cm — AB (ref 0.6–0.9)
MV A Velocity: 0.54 m/s
MV Area by PHT: 5.5 cm2
MV E Velocity: 0.91 m/s
MV E Wave Deceleration Time: 138.7 ms
MV E/A: 1.69
MV PHT: 40.2 ms
RA Major Axis Index: 1.67 cm/m2
RA Major Axis: 3.6 cm
RV Free Wall Peak S': 14 cm/s
RVIDd: 3.6 cm
TAPSE: 1.9 cm (ref 1.5–2.0)

## 2020-12-03 ENCOUNTER — Encounter: Payer: PRIVATE HEALTH INSURANCE | Attending: Cardiovascular Disease | Primary: Family Medicine

## 2021-01-27 NOTE — Telephone Encounter (Signed)
Called and LVM for patient to call the office back

## 2021-01-27 NOTE — Telephone Encounter (Signed)
-----   Message from Madilyn Pae sent at 01/27/2021  7:28 AM EST -----  Subject: Hospital Follow Up    QUESTIONS  What hospital was the Patient Discharged from? [facility]  Date of Discharge? 2021-01-25  Discharge Location? Home  Reason for hospitalization as patient stated? Flare up of Chrohn's ,   intestines closing  What question does the patient have, if applicable? Patient needs a   hospital follow-up by next week. She would like a Friday or a Monday if   possible.   ---------------------------------------------------------------------------  --------------  CALL BACK INFO  What is the best way for the office to contact you? OK to leave message on   voicemail  Preferred Call Back Phone Number? [phone]  ---------------------------------------------------------------------------  --------------  SCRIPT ANSWERS  Relationship to Patient? Self  (Patient requests to see provider urgently.)? No  (Has the patient been discharged from the hospital within 2 business days   AND does not have a Telephone Encounter - "Follow Up From Hospital"   documented in Epic?)? Yes  Do you have any questions for your primary care provider that need to be   answered prior to your appointment? (Use RN Triage if question pertains to   anything on the red flag list)? No  (Patient needs follow up visit after hospital discharge) Book first   available appointment within 7 days OF DISCHARGE, if no appt, proceed to   book the next available time slot within 14 days OF DISCHARGE AND Send   Message to Provider. A Hospital Follow Up appointment cannot be booked   beyond 14 Days and should result in a Message to Provider.? Yes

## 2021-01-28 NOTE — Telephone Encounter (Signed)
Patient has apt scheduled for 02/03/21

## 2021-01-28 NOTE — Progress Notes (Signed)
 Care Transitions Initial Call    Call within 2 business days of discharge:  No patient assigned to this CTN today 01/28/21    Patient: Denise Huber Patient DOB: Mar 22, 1986 MRN: [id number]    Last Discharge [facility] Facility       Complaint Diagnosis Description Type Department Provider    03/28/20   Admission (Discharged) [facility] Danial Earney RAMAN, MD          Was this an external facility discharge? Yes 3/4--3/6  Discharge Facility: [facility]    Challenges to be reviewed by the provider   Additional needs identified to be addressed with provider: yes    [facility] Hospitalist recommends Humira level follow up to determine effectiveness of current dose         Method of communication with provider : chart routing    Discussed COVID-19 related testing which was available at this time.   Test results were negative.   Patient informed of results, if available? yes     Advance Care Planning:   Does patient have an Advance Directive:  will discuss at another call, patient was currently at work when CTN contacted her    Inpatient Readmission Risk score: No data recorded  Was this a readmission? no       Patients top risk factors for readmission: medical condition-Patient with Crohns disease, immunocompromised   Interventions to address risk factors: Scheduled appointment with PCP-see goal, Scheduled appointment with Specialist-see goal, Assessment and support for treatment adherence and medication management-reviewed med list and updates as patient admitted to non [facility] The hospital and Assistance in accessing community resources-[facility]    Care Transition Nurse (CTN) contacted the patient by telephone to perform post hospital discharge assessment. Verified name and DOB with patient as identifiers. Provided introduction to self, and explanation of the CTN role.     CTN reviewed discharge instructions, medical action plan and red flags with patient who verbalized understanding. Were discharge instructions available to patient?  unknown. Reviewed appropriate site of care based on symptoms and resources available to patient including: PCP and Specialist. Patient given an opportunity to ask questions and does not have any further questions or concerns at this time. The patient agrees to contact the PCP office for questions related to their healthcare.     Medication reconciliation was performed with patient, who verbalizes understanding of administration of home medications. Advised obtaining a 90-day supply of all daily and as-needed medications.   Referral to Pharm D needed: no     Home Health/Outpatient orders at discharge: none      Durable Medical Equipment ordered at discharge: None      Covid Risk Education    Educated patient about risk for severe COVID-19 due to risk factors according to CDC guidelines. CTN reviewed discharge instructions, medical action plan and red flag symptoms with the patient who verbalized understanding. Discussed COVID vaccination status: yes. Education provided on COVID-19 vaccination as appropriate. Discussed exposure protocols and quarantine with CDC Guidelines. Patient was given an opportunity to verbalize any questions and concerns and agrees to contact CTN or health care provider for questions related to their healthcare.    Discussed follow-up appointments. If no appointment was previously scheduled, appointment scheduling offered: yes. Is follow up appointment scheduled within 7 days of discharge? yes.   [facility] follow up appointment(s):   Future Appointments   Date Time Provider Department Center   02/03/2021  2:15 PM Izora Emelia DEL, DO [facility]   02/11/2021 10:00  AM Orvil Reichert, MD [facility]     Non-[facility] follow up appointment(s): Dr Danial GI--per patient 02/03/21 is appt    Plan for follow-up call in 5-7 days based on severity of symptoms and risk factors.  Plan for next call: symptom management-crohns disease  CTN provided contact information for future needs.    Goals Addressed                  This Visit's Progress    . Prevent complications post hospitalization.        01/28/21  CTN spoke to patient to review discharge instructions red flags to prevent complications    Appts:    PCP--per chart notes and patient statement, she has TOC appt on 02/03/21    Dr Danial, GI--pt reports that she has an appt with MD on 02/03/21, same day as PCP    . Confirmed with patient that she picked up her new medications at discharge, reviewed med list and updated.  . Patient will remain symptom free from Crohns Disease: At time of call, patient denies fever, abd. Pain, N/V. Patient reports she is eating and drinking normally, has had BM daily.  . Patient reports she will discuss steroid taper with MD next week to see if GI agrees to hospitalist taper schedule  . DH information given    CTN will follow up next week---mkrw

## 2021-02-02 NOTE — Telephone Encounter (Signed)
Patient dropped off fmla forms to be completed and faxed for her crohn's disease. Forms placed in box up front.   Please call patient at [phone] when faxed.

## 2021-02-03 ENCOUNTER — Telehealth: Attending: Family Medicine | Primary: Family Medicine

## 2021-02-03 ENCOUNTER — Telehealth
Admit: 2021-02-03 | Discharge: 2021-02-03 | Payer: PRIVATE HEALTH INSURANCE | Attending: Family Medicine | Primary: Family Medicine

## 2021-02-03 DIAGNOSIS — K50919 Crohn's disease, unspecified, with unspecified complications: Secondary | ICD-10-CM

## 2021-02-03 NOTE — Progress Notes (Signed)
Progress  Notes by Dietrich Pates, DO at 02/03/21 1415                Author: Dietrich Pates, DO  Service: --  Author Type: Physician       Filed: 02/03/21 1417  Encounter Date: 02/03/2021  Status: Signed          Editor: Dietrich Pates, DO (Physician)                                Progress Note      she is a 35 y.o.  year old female who presents for evalution.        Subjective:        Pt with crohns and needs FMLA completed forthis.  HAs had issues with GI completing.  Will complete for 1 episode per month up to 5 days per episode.  Patient is seeing her GI doctor on Friday currently on 40 mg of prednisone she will discuss a taper  with him.  Also on Humira.         Reviewed PmHx, RxHx, FmHx, SocHx, AllgHx and updated and dated in the chart.      Review of Systems - negative except as listed above in the HPI        Objective:     There were no vitals filed for this visit.        Current Outpatient Medications        Medication  Sig         ?  amoxicillin-clavulanate (Augmentin) 875-125 mg per tablet  Take  by mouth every twelve (12) hours.     ?  predniSONE (DELTASONE) 20 mg tablet  Take  by mouth daily (with breakfast).     ?  acetaminophen (TylenoL) 325 mg tablet  Take 325 mg by mouth every four (4) hours as needed for Pain.     ?  hydrOXYzine pamoate (VISTARIL) 25 mg capsule  Take 1 Cap by mouth three (3) times daily as needed for Anxiety.         ?  adalimumab (Humira) 40 mg/0.8 mL injection  by SubCUTAneous route once.          No current facility-administered medications for this visit.           Physical Examination: General appearance - alert, well appearing, and in no distress   Mental status - alert, oriented to person, place, and time          Assessment/ Plan:     Diagnoses and all orders for this visit:      1. Crohn's disease with complication, unspecified gastrointestinal tract location [facility])   Currently on prednisone through GI.  She needs FMLA completed for work for the year up to 1 episode  per month up to 5 days per episode.  Will complete once back in office.         Follow-up and Dispositions      ??  Return if symptoms worsen or fail to improve.                   I have discussed the diagnosis with the patient and the intended plan as seen in the above orders.  The patient has received an after-visit  summary and questions were answered concerning future plans. Pt conveyed understanding of plan.      Medication Side Effects  and Warnings were discussed with patient         Denise Huber is being evaluated by a Virtual Visit (video visit) encounter to address concerns as mentioned above.  A caregiver was present  when appropriate. Due to this being a Scientist, research (medical) (During COVID-19 public health emergency), evaluation of the following organ systems was limited: Vitals/Constitutional/EENT/Resp/CV/GI/GU/MS/Neuro/Skin/Heme-Lymph-Imm.  Pursuant to the emergency  declaration under the [facility] Act and the [employer], 1135 waiver authority and the [employer] Act, this Virtual Visit was conducted with patient's (and/or legal guardian's) consent,  to reduce the patient's risk of exposure to COVID-19 and provide necessary medical care.  The patient (and/or legal guardian) has also been advised to contact this office for worsening conditions or problems, and seek emergency medical treatment and/or  call 911 if deemed necessary.      Patient identification was verified at the start of the visit: Yes      Services were provided through a video synchronous discussion virtually to substitute for in-person clinic visit. Patient and provider were located at their individual homes.   --Dietrich Pates, DO on 02/03/2021 at 2:04 PM

## 2021-02-03 NOTE — Telephone Encounter (Signed)
Placed on providers desk for completion. She have a VV for today

## 2021-02-04 NOTE — Telephone Encounter (Signed)
FMLA forms faxed to [facility]. Fax number [phone] confirmation number [id number]. LVM for patient that forms have been faxed with confirmation received and that a copy has been placed at the front desk for pick up.

## 2021-02-04 NOTE — Telephone Encounter (Signed)
completed

## 2021-02-09 NOTE — Progress Notes (Signed)
 Goals     . Prevent complications post hospitalization.      01/28/21  CTN spoke to patient to review discharge instructions red flags to prevent complications    Appts:    PCP--per chart notes and patient statement, she has TOC appt on 02/03/21    Dr Danial, GI--pt reports that she has an appt with MD on 02/03/21, same day as PCP    . Confirmed with patient that she picked up her new medications at discharge, reviewed med list and updated.  . Patient will remain symptom free from Crohns Disease: At time of call, patient denies fever, abd. Pain, N/V. Patient reports she is eating and drinking normally, has had BM daily.  . Patient reports she will discuss steroid taper with MD next week to see if GI agrees to hospitalist taper schedule  . DH information given    CTN will follow up next week---mkrw    02/09/21  CTN unable tor each pt on phone, LM on VM requesting return call. Chart reviewed, patient attended PCP appt on 3/15. CTN will follow up next week---mkrw

## 2021-02-10 NOTE — Telephone Encounter (Signed)
Confirmed with pt for appt with [name] at [facility]

## 2021-02-11 ENCOUNTER — Ambulatory Visit: Attending: Cardiovascular Disease | Primary: Family Medicine

## 2021-02-11 ENCOUNTER — Ambulatory Visit
Admit: 2021-02-11 | Discharge: 2021-02-11 | Payer: PRIVATE HEALTH INSURANCE | Attending: Cardiovascular Disease | Primary: Family Medicine

## 2021-02-11 DIAGNOSIS — R011 Cardiac murmur, unspecified: Secondary | ICD-10-CM

## 2021-02-11 NOTE — Progress Notes (Signed)
Progress  Notes by Denise Rodriguez, MD at 02/11/21 1000                Author: [name], MD  Service: --  Author Type: Physician       Filed: 02/11/21 1043  Encounter Date: 02/11/2021  Status: Signed          Editor: Denise Rodriguez, MD (Physician)                        Dr. Isaias Huber                                  CAV Denise Huber.  [phone]               Cardiology Consult/Progress Note        Requesting/referring provider: Dietrich Pates, DO, Dr. Steva Huber      Reason for Consult: Chest pain      Assessment/Plan:   1.  Atypical chest pain nonexertional   2.  History of Crohn's disease on Humira   3.  History of tobacco abuse   4.  Intermittent flow murmur      Ms. Denise Huber is seen for atypical chest pain.  With exertion she does not report of any significant shortness of breath or chest pain.  Overall her pretest probability of having CAD is pretty low.  Her EKG is unremarkable for ischemia.  Probability of  having significant coronary disease is low.  Nonetheless for chest pain I offered her to do an echocardiogram to rule out any underlying structural heart disease particularly in light of the fact that she is on Humira which can provoke heart failure symptoms.   Other than that she does not require any limitations from cardiac standpoint.Reassured that structurally her heart is normal.  She has had intermittent murmur in the past which I suspect is likely a flow murmur across the aortic valve from intermittent  anemia causing increased cardiac flow.      Investigations ordered:       []     High complexity decision making was performed   []     Patient is at high-risk of decompensation  with multiple organ involvement   []     Complex/difficult social determinants of  health outcomes      Investigations personally reviewed and interpreted   ECG was personally reviewed from September 2021 and shows sinus arrhythmia but otherwise normal ECG.   Investigations reviewed   11/04/20      ECHO ADULT COMPLETE  11/05/2020 11/05/2020      Interpretation Summary   ?  Left??Ventricle: Left ventricle size is normal. Increased wall thickness. Normal wall motion. Normal left ventricular systolic function with a visually estimated  EF of 55 - 60%. Normal diastolic function.      Signed by: 11/06/20, MD on 11/05/2020  3:14 PM         HPI: Denise Huber, a  35 y.o. year-old who is seen for evaluation of and management of chest pain.  Chest pain is reported as upper left shoulder region pain with no exertional component.  Is often described as sharp pain.   No specific relieving or propagating factors are reported.  She has history of asthma and Crohn's disease and is on Biologics for remission.  No prior history of cardiac disease during pregnancies.  No family history of premature coronary disease.The  patient denies chest pain/ shortness of breath, orthopnea, PND, LE edema, palpitations, syncope, presyncope or fatigue.   She reports no further chest pain since her last round of steroids.  She is now back on steroid taper and asymptomatic.   Has been told she has a murmur.    She  has a past medical history of Abnormal Pap smear, Anemia NEC,  Asthma, Crohn's disease (HCC), Depression, OTHER MEDICAL, Pap smear, as part of routine gynecological examination (03/24/2011), and Sickle-cell disease, unspecified.      She has no past medical history of Acquired hypothyroidism, Complication of anesthesia, Diabetes mellitus, Essential hypertension, Genital herpes, unspecified, Heart abnormalities,  Herpes gestationis, Herpes simplex without mention of complication, Human immunodeficiency virus (HIV) disease (HCC), Infertility, Kidney disease, Liver disease, Phlebitis and thrombophlebitis of unspecified site, Postpartum depression, Rhesus isoimmunization  unspecified as to episode of care in pregnancy, Systemic lupus erythematosus (HCC), Trauma, Unspecified breast disorder, Unspecified diseases of blood and blood-forming organs, or  Unspecified epilepsy without mention of intractable epilepsy.        Review of system:Patient reports no dyspnea/PND/Orthpnea.She reports no cough/fever/focal neurological deficits.  Abdominal pain from Crohn's disease now getting better since she was given a steroid taper and reinitiated on Humira.  All other systems  negative except as above.      Family History         Problem  Relation  Age of Onset          ?  Cancer  Father             Social History          Socioeconomic History         ?  Marital status:  SINGLE       Occupational History         ?  Occupation:  call center              Employer:  OTHER       Tobacco Use         ?  Smoking status:  Current Every Day Smoker              Packs/day:  0.25         Years:  3.00         Pack years:  0.75         Last attempt to quit:  10/20/2013         Years since quitting:  7.3         ?  Smokeless tobacco:  Never Used        ?  Tobacco comment: 4 cigs. a day       Substance and Sexual Activity         ?  Alcohol use:  No              Alcohol/week:  0.0 standard drinks         ?  Drug use:  No     ?  Sexual activity:  Yes              Partners:  Male              Birth control/protection:  None         PE     Vitals:          02/11/21 0959        BP:  120/70     Pulse:  100  Resp:  14     SpO2:  99%     Weight:  224 lb (101.6 kg)        Height:  5\' 9"  (1.753 m)      Body mass index is 33.08 kg/m??.         General:     Alert, cooperative, no distress.     Psychiatric:     Normal Mood and affect      Eye/ENT:       Pupils equal, No asymmetry, Conjunctival pink. Able to hear voice at normal amplitude     Lungs:       Visibly symmetric chest expansion, No palpable tenderness. Clear to auscultation bilaterally.      Heart::     Regular rate and rhythm, II/VI systolic murmur, RSB, No click, rub or gallop.No JVD, Normal palpable peripheral pulses. No cyanosis     Abdomen:      Soft, non-tender. Bowel sounds normal. No masses,  No       organomegaly.      Extremities:    Extremities normal, atraumatic, no edema.        Neurologic:    CN II-XII grossly intact. No gross focal deficits                 Recent Labs:   No results found for: CHOL, CHOLX, CHLST, CHOLV, 884269, HDL, HDLP, LDL, LDLC, DLDLP, TGLX, TRIGL, TRIGP, CHHD, CHHDX     Lab Results         Component  Value  Date/Time            Creatinine  0.56  02/24/2019 04:23 AM          Lab Results         Component  Value  Date/Time            BUN  4 (L)  02/24/2019 04:23 AM          Lab Results         Component  Value  Date/Time            Potassium  3.8  02/24/2019 04:23 AM        No results found for: HBA1C, HBA1CEXT, HBA1CEXT     Lab Results         Component  Value  Date/Time            HGB  10.0 (L)  02/24/2019 04:23 AM          Lab Results         Component  Value  Date/Time            PLATELET  410 (H)  02/24/2019 04:23 AM           Reviewed:     Past Medical History:        Diagnosis  Date         ?  Abnormal Pap smear            ASCUS 2010/LGSIL 2008/2010         ?  Anemia NEC       ?  Asthma            ALBUTEROL PRN         ?  Crohn's disease (HCC)       ?  Depression            DEPRESSION - CONTROLLED         ?  HX OTHER MEDICAL            CHLAMYDIA 2006, HPV         ?  Pap smear, as part of routine gynecological examination  03/24/2011          Dr. Carlye Grippe [facility]         ?  Sickle-cell disease, unspecified            TRAIT          Social History          Tobacco Use        Smoking Status  Current Every Day Smoker         ?  Packs/day:  0.25     ?  Years:  3.00     ?  Pack years:  0.75     ?  Last attempt to quit:  10/20/2013     ?  Years since quitting:  7.3        Smokeless Tobacco  Never Used       Tobacco Comment          4 cigs. a day          Social History          Substance and Sexual Activity        Alcohol Use  No         ?  Alcohol/week:  0.0 standard drinks          Allergies        Allergen  Reactions         ?  Lortab [Hydrocodone-Acetaminophen]  Itching     ?  Other  Medication  Other (comments)             cats          Family History         Problem  Relation  Age of Onset          ?  Cancer  Father                Current Outpatient Medications        Medication  Sig         ?  predniSONE (DELTASONE) 20 mg tablet  Take  by mouth daily (with breakfast).     ?  acetaminophen (TylenoL) 325 mg tablet  Take 325 mg by mouth every four (4) hours as needed for Pain.     ?  hydrOXYzine pamoate (VISTARIL) 25 mg capsule  Take 1 Cap by mouth three (3) times daily as needed for Anxiety.     ?  adalimumab (Humira) 40 mg/0.8 mL injection  by SubCUTAneous route once.         ?  amoxicillin-clavulanate (Augmentin) 875-125 mg per tablet  Take  by mouth every twelve (12) hours. (Patient not taking: Reported on 02/11/2021)          No current facility-administered medications for this visit.           Epimenio Foot, ANP03/23/22       ATTENTION:    This medical record was transcribed using an electronic medical records/speech recognition system.  Although proofread, it may and can contain electronic, spelling and other errors.  Corrections may be executed at a later time.  Please feel free to  contact us for any clarifications as needed.      [facility]  CAV, [employer],   [place], [place]. [phone]

## 2021-02-16 NOTE — Progress Notes (Signed)
 Goals     . Prevent complications post hospitalization.      01/28/21  CTN spoke to patient to review discharge instructions red flags to prevent complications    Appts:    PCP--per chart notes and patient statement, she has TOC appt on 02/03/21    Dr Danial, GI--pt reports that she has an appt with MD on 02/03/21, same day as PCP    . Confirmed with patient that she picked up her new medications at discharge, reviewed med list and updated.  . Patient will remain symptom free from Crohns Disease: At time of call, patient denies fever, abd. Pain, N/V. Patient reports she is eating and drinking normally, has had BM daily.  . Patient reports she will discuss steroid taper with MD next week to see if GI agrees to hospitalist taper schedule  . DH information given    CTN will follow up next week---mkrw    02/09/21  CTN unable tor each pt on phone, LM on VM requesting return call. Chart reviewed, patient attended PCP appt on 3/15. CTN will follow up next week---mkrw    02/16/21  CTN unable to reach pt again on phone, LM on VM. CTN will send Mychart message.  Per chart review pt attended appt with Dr Orvil on 02/11/21. CTN will follow up next week--mkrw

## 2021-02-26 NOTE — Progress Notes (Signed)
 Patient has graduated from the Transitions of Care Coordination  program on 02/26/2021.  Patient/family has the ability to self-manage at this time Care management goals have been completed. Patient was not referred to the [facility] team for further management.     Goals Addressed                 This Visit's Progress    . COMPLETED: Prevent complications post hospitalization.        01/28/21  CTN spoke to patient to review discharge instructions red flags to prevent complications    Appts:    PCP--per chart notes and patient statement, she has TOC appt on 02/03/21    Dr Danial, GI--pt reports that she has an appt with MD on 02/03/21, same day as PCP    . Confirmed with patient that she picked up her new medications at discharge, reviewed med list and updated.  . Patient will remain symptom free from Crohns Disease: At time of call, patient denies fever, abd. Pain, N/V. Patient reports she is eating and drinking normally, has had BM daily.  . Patient reports she will discuss steroid taper with MD next week to see if GI agrees to hospitalist taper schedule  . DH information given    CTN will follow up next week---mkrw    02/09/21  CTN unable tor each pt on phone, LM on VM requesting return call. Chart reviewed, patient attended PCP appt on 3/15. CTN will follow up next week---mkrw    02/16/21  CTN unable to reach pt again on phone, LM on VM. CTN will send Mychart message.  Per chart review pt attended appt with Dr Orvil on 02/11/21. CTN will follow up next week--mkrw    02/26/21  CTN unable to contact pt on phone, LM on VM. Chart reviewed, no evidence of readmission noted.---mkrw              Patient has Care Transition Nurse's contact information for any further questions, concerns, or needs.  Patients upcoming visits:  No future appointments.

## 2021-06-10 ENCOUNTER — Encounter

## 2021-06-16 MED ORDER — HYDROXYZINE PAMOATE 25 MG CAP
25 mg | ORAL_CAPSULE | Freq: Three times a day (TID) | ORAL | 5 refills | Status: DC | PRN
Start: 2021-06-16 — End: 2021-08-19

## 2021-06-17 ENCOUNTER — Inpatient Hospital Stay: Admit: 2021-06-17 | Payer: PRIVATE HEALTH INSURANCE | Attending: Colon & Rectal Surgery | Primary: Family Medicine

## 2021-06-17 DIAGNOSIS — K50012 Crohn's disease of small intestine with intestinal obstruction: Secondary | ICD-10-CM

## 2021-06-17 MED ORDER — IOPAMIDOL 76 % IV SOLN
370 mg iodine /mL (76 %) | Freq: Once | INTRAVENOUS | Status: AC
Start: 2021-06-17 — End: 2021-06-17
  Administered 2021-06-17: 19:00:00 via INTRAVENOUS

## 2021-06-17 MED FILL — ISOVUE-370  76 % INTRAVENOUS SOLUTION: 370 mg iodine /mL (76 %) | INTRAVENOUS | Qty: 100

## 2021-06-26 ENCOUNTER — Inpatient Hospital Stay: Admit: 2021-06-26 | Payer: PRIVATE HEALTH INSURANCE | Primary: Family Medicine

## 2021-06-26 ENCOUNTER — Inpatient Hospital Stay: Admit: 2021-06-26 | Payer: PRIVATE HEALTH INSURANCE | Attending: Colon & Rectal Surgery | Primary: Family Medicine

## 2021-06-26 DIAGNOSIS — Z01818 Encounter for other preprocedural examination: Secondary | ICD-10-CM

## 2021-06-26 DIAGNOSIS — Z01811 Encounter for preprocedural respiratory examination: Secondary | ICD-10-CM

## 2021-06-26 LAB — CBC
Hematocrit: 33.7 % — ABNORMAL LOW (ref 35.0–47.0)
Hemoglobin: 11.4 g/dL — ABNORMAL LOW (ref 11.5–16.0)
MCH: 27.8 PG (ref 26.0–34.0)
MCHC: 33.8 g/dL (ref 30.0–36.5)
MCV: 82.2 FL (ref 80.0–99.0)
MPV: 9.7 FL (ref 8.9–12.9)
NRBC Absolute: 0 10*3/uL (ref 0.00–0.01)
Nucleated RBCs: 0 PER 100 WBC
Platelets: 367 10*3/uL (ref 150–400)
RBC: 4.1 M/uL (ref 3.80–5.20)
RDW: 15.1 % — ABNORMAL HIGH (ref 11.5–14.5)
WBC: 7.1 10*3/uL (ref 3.6–11.0)

## 2021-06-26 LAB — EKG 12-LEAD
Atrial Rate: 65 {beats}/min
Diagnosis: NORMAL
P Axis: 64 degrees
P-R Interval: 186 ms
Q-T Interval: 402 ms
QRS Duration: 78 ms
QTc Calculation (Bazett): 418 ms
R Axis: -3 degrees
T Axis: 13 degrees
Ventricular Rate: 65 {beats}/min

## 2021-06-26 LAB — COMPREHENSIVE METABOLIC PANEL
ALT: 17 U/L (ref 12–78)
AST: 7 U/L — ABNORMAL LOW (ref 15–37)
Albumin/Globulin Ratio: 0.9 — ABNORMAL LOW (ref 1.1–2.2)
Albumin: 3.3 g/dL — ABNORMAL LOW (ref 3.5–5.0)
Alkaline Phosphatase: 91 U/L (ref 45–117)
Anion Gap: 3 mmol/L — ABNORMAL LOW (ref 5–15)
BUN: 9 MG/DL (ref 6–20)
Bun/Cre Ratio: 14 (ref 12–20)
CO2: 28 mmol/L (ref 21–32)
Calcium: 8.9 MG/DL (ref 8.5–10.1)
Chloride: 107 mmol/L (ref 97–108)
Creatinine: 0.66 MG/DL (ref 0.55–1.02)
EGFR IF NonAfrican American: >60 mL/min/{1.73_m2} (ref >60)
GFR African American: >60 mL/min/{1.73_m2} (ref >60)
Globulin: 3.7 g/dL (ref 2.0–4.0)
Glucose: 91 mg/dL (ref 65–100)
Potassium: 4.3 mmol/L (ref 3.5–5.1)
Sodium: 138 mmol/L (ref 136–145)
Total Bilirubin: 0.3 MG/DL (ref 0.2–1.0)
Total Protein: 7 g/dL (ref 6.4–8.2)

## 2021-06-26 LAB — HEMOGLOBIN A1C W/EAG
Hemoglobin A1C: 5.3 % (ref 4.0–5.6)
eAG: 105 mg/dL

## 2021-06-26 LAB — TYPE AND SCREEN
ABO/Rh: B POS
Antibody Screen: NEGATIVE

## 2021-06-26 LAB — METABOLIC PANEL, COMPREHENSIVE
A-G Ratio: 0.9 — ABNORMAL LOW (ref 1.1–2.2)
ALT (SGPT): 17 U/L (ref 12–78)
AST (SGOT): 7 U/L — ABNORMAL LOW (ref 15–37)
Albumin: 3.3 g/dL — ABNORMAL LOW (ref 3.5–5.0)
Alk. phosphatase: 91 U/L (ref 45–117)
Anion gap: 3 mmol/L — ABNORMAL LOW (ref 5–15)
BUN/Creatinine ratio: 14 (ref 12–20)
BUN: 9 MG/DL (ref 6–20)
Bilirubin, total: 0.3 MG/DL (ref 0.2–1.0)
CO2: 28 mmol/L (ref 21–32)
Calcium: 8.9 MG/DL (ref 8.5–10.1)
Chloride: 107 mmol/L (ref 97–108)
Creatinine: 0.66 MG/DL (ref 0.55–1.02)
GFR est AA: >60 mL/min/{1.73_m2} (ref >60)
GFR est non-AA: >60 mL/min/{1.73_m2} (ref >60)
Globulin: 3.7 g/dL (ref 2.0–4.0)
Glucose: 91 mg/dL (ref 65–100)
Potassium: 4.3 mmol/L (ref 3.5–5.1)
Protein, total: 7 g/dL (ref 6.4–8.2)
Sodium: 138 mmol/L (ref 136–145)

## 2021-06-26 LAB — CBC W/O DIFF
ABSOLUTE NRBC: 0 10*3/uL (ref 0.00–0.01)
HCT: 33.7 % — ABNORMAL LOW (ref 35.0–47.0)
HGB: 11.4 g/dL — ABNORMAL LOW (ref 11.5–16.0)
MCH: 27.8 PG (ref 26.0–34.0)
MCHC: 33.8 g/dL (ref 30.0–36.5)
MCV: 82.2 FL (ref 80.0–99.0)
MPV: 9.7 FL (ref 8.9–12.9)
NRBC: 0 PER 100 WBC
PLATELET: 367 10*3/uL (ref 150–400)
RBC: 4.1 M/uL (ref 3.80–5.20)
RDW: 15.1 % — ABNORMAL HIGH (ref 11.5–14.5)
WBC: 7.1 10*3/uL (ref 3.6–11.0)

## 2021-06-26 LAB — EKG, 12 LEAD, INITIAL
Atrial Rate: 65 {beats}/min
Calculated P Axis: 64 degrees
Calculated R Axis: -3 degrees
Calculated T Axis: 13 degrees
Diagnosis: NORMAL
P-R Interval: 186 ms
Q-T Interval: 402 ms
QRS Duration: 78 ms
QTC Calculation (Bezet): 418 ms
Ventricular Rate: 65 {beats}/min

## 2021-06-26 LAB — HEMOGLOBIN A1C WITH EAG
Est. average glucose: 105 mg/dL
Hemoglobin A1c: 5.3 % (ref 4.0–5.6)

## 2021-06-26 LAB — TYPE & SCREEN
ABO/Rh(D): B POS
Antibody screen: NEGATIVE

## 2021-06-26 NOTE — Interval H&P Note (Signed)
PREOPERATIVE INSTRUCTIONS REVIEWED WITH PATIENT. PATIENT GIVEN TWO BOTTLES OF CHG SOAP AND INSTRUCTED ON HOW TO USE.  PATIENT GIVEN SSI INFECTION SHEET. ERAS BINDER REVIEWED WITH PATIENT, PAGES 7-11 AND ALL QUESTIONS ANSWERED. PATIENT HAS ALREADY PICKED UP HER PRESCRIPTIONS FOR HER PRE-OP BOWEL PREP. WE REVIEWED INSTRUCTIONS AND SHE UNDERSTANDS.  PT PROVIDED WITH INCENTIVE SPIROMETER AND INSTRUCTIONS ON HOW TO USE PROPERLY.  PT DEMONSTRATED CORRECT USE OF INCENTIVE SPIROMETER.  PATIENT WAS GIVEN THE OPPORTUNITY TO ASK QUESTIONS ON THE INFORMATION PROVIDED.     PAT Nutrition Screen     1. Has the patient had an unplanned weight loss of 10% of body weight or more in the last 6 months? No = 0   2. Has the patient been eating less than 50% of their normal diet in the preceding week? No = 0         Patient instructed on Non-Diabetic pre-operative nutrition plan to start 5 days prior to surgery. Patient given 10 shakes and 3 pre-surgery drinks. Opportunity given for questions and all questions answered.     Surgical consent obtained and signed by patient.       Patient instructed to obtain [facility] at [facility] upon leaving PAT department.       Patient did not have COVID vaccination card today; instructed to bring day of surgery.
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
## 2021-06-26 NOTE — Interval H&P Note (Signed)
 [facility]  Notes by Marshal Faster, RN at 06/26/21 0930                Author: Marshal Faster, RN  Service: --  Author Type: Registered Nurse       Filed: 06/26/21 1032  Date of Service: 06/26/21 0930  Status: Signed          Editor: Marshal Faster, RN (Registered Nurse)               [facility] PREOPERATIVE INSTRUCTIONS   [facility]      Surgery Date:   07/02/21      Your surgeon's office or [facility] staff will call you between 4 PM- 8 PM the day before surgery with your arrival time. If your surgery is on a Monday, you will receive a call the preceding Friday.      1.  Please report to [facility] Patient Access/Admitting on the 1st floor.  Bring your insurance card, photo identification, and  any copayment ( if applicable).    2.  If you are going home the same day of your surgery, you must have a responsible adult to drive you home. You need to have a responsible adult  to stay with you the first 24 hours after surgery and you should not drive a car for 24 hours following your surgery.   3.  If you are being admitted to the hospital, please leave personal belongings/luggage in your car until you have an assigned hospital room number.   4.  Please refer to ERAS book for Pre-operative Nutrition Plan.   5.  Do NOT drink alcohol or smoke 24 hours before surgery. STOP smoking for 14 days prior as it helps with breathing and healing after surgery.   6.  Please wear comfortable clothes. Wear your glasses instead of contacts. We ask that all money, jewelry and valuables be left at home. Wear no make up, particularly mascara, the day of surgery.    7.   All body piercings, rings, and jewelry need to be removed and left at home. Please remove any nail polish or artificial nails from your fingernails.  Please wear your hair loose or down. Please no pony-tails, buns, or any metal hair accessories. If you shower the morning of surgery, please do not apply any lotions or powders afterwards. You may wear deodorant.   Do not shave any body area within 24  hours of your surgery.   8.  Please follow all instructions to avoid any potential surgical cancellation.   9.  Should your physical condition change, (i.e. fever, cold, flu, etc.) please notify your surgeon as soon as possible.   10.  It is important to be on time. If a situation occurs where you may be delayed, please  call:  [phone] / [phone]  on the day of surgery.   11.  The Preadmission Testing staff can be reached at [phone].   12.  Special instructions: FOLLOW INSTRUCTION FOR BOWEL PREP AS DISCUSSED        Current Outpatient Medications        Medication  Sig         ?  traMADoL (ULTRAM) 50 mg tablet  Take 50 mg by mouth every eight (8) hours as needed for Pain.     ?  hydrOXYzine  pamoate (VISTARIL ) 25 mg capsule  Take 1 Capsule by mouth three (3) times daily as needed for Anxiety.     ?  acetaminophen  (TYLENOL ) 325 mg tablet  Take 325 mg by mouth every four (4) hours as needed for Pain.         ?  adalimumab (Humira) 40 mg/0.8 mL injection  by SubCUTAneous route once.          No current facility-administered medications for this encounter.           1.  YOU MUST ONLY TAKE THESE  MEDICATIONS THE MORNING OF SURGERY WITH A SIP OF WATER: N/A   2.  MEDICATIONS TO TAKE THE MORNING OF SURGERY ONLY IF NEEDED: VISTARIL .  YOU MAY ALSO  TAKE TRAMADOL UP TO 4 HOURS PRIOR TO ARRIVAL.   3.  HOLD these prescription medications BEFORE  surgery: N/A   4.  Ask your surgeon/prescribing physician about when/if to STOP taking these medications:  N/A   5.  Stop all vitamins, herbal medicines and Aspirin containing products 7 days prior to surgery. Stop any non-steroidal anti-inflammatory drugs (i.e. Ibuprofen, Naproxen, Advil, Aleve) 3 days before surgery.  You may take Tylenol .     6.  If you are currently taking Aspirin, Plavix, Coumadin or any other blood-thinning/anticoagulant medication contact your prescribing physician  for instructions.         Eating and  Drinking Before Surgery        You may eat a regular dinner at the usual time on the day before your surgery.     Do NOT eat any solid foods after midnight unless your arrival time at the hospital is  3pm or later.     You may drink clear liquids only from 12 midnight until 1 hour prior to your arrival time at the hospital on the day of your surgery. Do NOT drink  alcohol.     Clear liquids include:   o  Water   o  Fruit juices without pulp( i.e. apple juice)   o  Carbonated beverages   o  Black coffee (no cream/milk)   o  Tea (no cream/milk)   o  Gatorade     You may drink up to 12-16 ounces at one time  every 4 hours between the hours of midnight and 1 hour before your arrival time at the hospital. Example- if your arrival time at the hospital is 6am, you may drink 12-16 ounces of clear liquids no later than 5am.     If your arrival time at the hospital is 3pm or later, you may eat a light breakfast before  8am.     A light breakfast includes:   o  Toast or bagel (no butter)   o  Black coffee (no cream/milk)   o  Tea (no cream/milk)   o  Fruit juices without pulp ( i.e. apple juice)   o  Do NOT eat meat, eggs, vegetables or fruit     If you have any questions, please contact your surgeon's office.          Incentive Spirometer              Using the incentive spirometer helps expand the small air sacs of your lungs, helps you breathe deeply, and helps improve your lung function.   Use your incentive spirometer twice a day (10 breaths each time) prior to surgery.        How to Use Your Incentive Spirometer:   1.  Hold the incentive spirometer in an upright position.    2.  Breathe out as usual.  3.  Place the mouthpiece in your mouth and seal your lips tightly around it.    4.  Take a deep breath.  Breathe in slowly and as deeply as possible. Keep the blue flow rate guide between the arrows.    5.  Hold your breath as long as possible. Then exhale slowly and allow the piston to fall to the bottom of the  column.    6.  Rest for a few seconds and repeat steps one through five at least 10 times.       Opportunity given to ask and answer questions.         Preventing Infections Before and After - Your Surgery      IMPORTANT INSTRUCTIONS      You play an important role in your health and preparation for surgery. To reduce the germs on your skin you will need to shower with CHG soap (Chorhexidine gluconate 4%) two times before surgery.      CHG soap (Hibiclens, Hex-A-Clens or store brand)     CHG soap will be provided at your Preadmission Testing (PAT) appointment.     If you do not have a PAT appointment before surgery, you may arrange to pick up CHG soap from our office or purchase CHG soap at a pharmacy, grocery or department store.     You need to purchase TWO 4 ounce bottles to use for your 2 showers.      Steps to follow:   1.  Wash your hair with your normal shampoo and your body with regular soap and rinse well to remove shampoo and soap from your skin.   2.  Wet a clean washcloth and turn off the shower.   3.  Put CHG soap on washcloth and apply to your entire body from the neck down. Do not use on your head, face or private parts(genitals). Do not use CHG soap on open sores, wounds or areas of skin irritation.   4.  Wash you body gently for 5 minutes. Do not wash your skin too hard. This soap does not create lather. Pay special attention to your underarms and from your belly button to your feet.   5.  Turn the shower back on and rinse well to get CHG soap off your body.   6.  Pat your skin dry with a clean, dry towel. Do not apply lotions or moisturizer.   7.  Put on clean clothes and sleep on fresh bed sheets and do not allow pets to sleep with you.      Shower with CHG soap 2 times before your surgery     The evening before your surgery     The morning of your surgery         Tips to help prevent infections after your surgery:   1.  Protect your surgical wound from germs:     Hand washing is the most  important thing you and your caregivers can do to prevent infections.     Keep your bandage clean and dry!     Do not touch your surgical wound.   2.  Use clean, freshly washed towels and washcloths every time you shower; do not share bath linens with others.   3.  Until your surgical wound is healed, wear clothing and sleep on bed linens each day that are clean and freshly washed.   4.  Do not allow pets to sleep in your bed with you or touch  your surgical wound.   5.  Do not smoke - smoking delays wound healing. This may be a good time to stop smoking.   6.  If you have diabetes, it is important for you to manage your blood sugar levels properly before your surgery as well as after your surgery. Poorly managed blood sugar levels slow down wound healing and prevent you from healing completely.               Patient Information Regarding COVID Restrictions      Day of Procedure        Please park in the parking deck or any designated visitor parking lot.     Enter the facility through the Main Entrance of the hospital.     On the day of surgery, please provide the cell phone number of the person who will be waiting for you to the Patient Access representative at the time of registration.     Please wear a mask on the day of your procedure.     We are now allowing two designated visitors per stay. Pediatric patients may have 2 designated visitors. These  two people may come in with you on the day of your procedure.     The designated visitor must also wear a mask.     Once your procedure and the immediate recovery period is completed, a nurse in the recovery area will contact your designated visitor to inform them of your room number or to otherwise review other pertinent information regarding your care.        Social distancing practices are to be adhered to in waiting areas and the cafeteria.           The patient was contacted in person.    She verbalized understanding of all instructions does not  need  reinforcement.

## 2021-07-02 ENCOUNTER — Inpatient Hospital Stay
Admit: 2021-07-02 | Discharge: 2021-07-06 | Disposition: A | Discharge status: Home / Self Care | Payer: PRIVATE HEALTH INSURANCE | Attending: Colon & Rectal Surgery | Admitting: Colon & Rectal Surgery

## 2021-07-02 DIAGNOSIS — K50912 Crohn's disease, unspecified, with intestinal obstruction: Secondary | ICD-10-CM

## 2021-07-02 LAB — CBC
Hematocrit: 33.6 % — ABNORMAL LOW (ref 35.0–47.0)
Hemoglobin: 11.6 g/dL (ref 11.5–16.0)
MCH: 27.9 PG (ref 26.0–34.0)
MCHC: 34.5 g/dL (ref 30.0–36.5)
MCV: 80.8 FL (ref 80.0–99.0)
MPV: 9.4 FL (ref 8.9–12.9)
NRBC Absolute: 0 10*3/uL (ref 0.00–0.01)
Nucleated RBCs: 0 PER 100 WBC
Platelets: 350 10*3/uL (ref 150–400)
RBC: 4.16 M/uL (ref 3.80–5.20)
RDW: 15.4 % — ABNORMAL HIGH (ref 11.5–14.5)
WBC: 18.3 10*3/uL — ABNORMAL HIGH (ref 3.6–11.0)

## 2021-07-02 LAB — BASIC METABOLIC PANEL
Anion Gap: 8 mmol/L (ref 5–15)
BUN: 7 MG/DL (ref 6–20)
Bun/Cre Ratio: 8 — ABNORMAL LOW (ref 12–20)
CO2: 23 mmol/L (ref 21–32)
Calcium: 8.4 MG/DL — ABNORMAL LOW (ref 8.5–10.1)
Chloride: 106 mmol/L (ref 97–108)
Creatinine: 0.83 MG/DL (ref 0.55–1.02)
EGFR IF NonAfrican American: >60 mL/min/{1.73_m2} (ref >60)
GFR African American: >60 mL/min/{1.73_m2} (ref >60)
Glucose: 153 mg/dL — ABNORMAL HIGH (ref 65–100)
Potassium: 3.5 mmol/L (ref 3.5–5.1)
Sodium: 137 mmol/L (ref 136–145)

## 2021-07-02 LAB — HCG URINE, QL. - POC
HCG, Pregnancy, Urine, POC: NEGATIVE
Pregnancy test,urine (POC): NEGATIVE

## 2021-07-02 LAB — PHOSPHORUS
Phosphorus: 2.5 MG/DL — ABNORMAL LOW (ref 2.6–4.7)
Phosphorus: 2.5 MG/DL — ABNORMAL LOW (ref 2.6–4.7)

## 2021-07-02 LAB — MAGNESIUM
Magnesium: 2.7 mg/dL — ABNORMAL HIGH (ref 1.6–2.4)
Magnesium: 2.7 mg/dL — ABNORMAL HIGH (ref 1.6–2.4)

## 2021-07-02 LAB — METABOLIC PANEL, BASIC
Anion gap: 8 mmol/L (ref 5–15)
BUN/Creatinine ratio: 8 — ABNORMAL LOW (ref 12–20)
BUN: 7 MG/DL (ref 6–20)
CO2: 23 mmol/L (ref 21–32)
Calcium: 8.4 MG/DL — ABNORMAL LOW (ref 8.5–10.1)
Chloride: 106 mmol/L (ref 97–108)
Creatinine: 0.83 MG/DL (ref 0.55–1.02)
GFR est AA: >60 mL/min/{1.73_m2} (ref >60)
GFR est non-AA: >60 mL/min/{1.73_m2} (ref >60)
Glucose: 153 mg/dL — ABNORMAL HIGH (ref 65–100)
Potassium: 3.5 mmol/L (ref 3.5–5.1)
Sodium: 137 mmol/L (ref 136–145)

## 2021-07-02 LAB — CBC W/O DIFF
ABSOLUTE NRBC: 0 10*3/uL (ref 0.00–0.01)
HCT: 33.6 % — ABNORMAL LOW (ref 35.0–47.0)
HGB: 11.6 g/dL (ref 11.5–16.0)
MCH: 27.9 PG (ref 26.0–34.0)
MCHC: 34.5 g/dL (ref 30.0–36.5)
MCV: 80.8 FL (ref 80.0–99.0)
MPV: 9.4 FL (ref 8.9–12.9)
NRBC: 0 PER 100 WBC
PLATELET: 350 10*3/uL (ref 150–400)
RBC: 4.16 M/uL (ref 3.80–5.20)
RDW: 15.4 % — ABNORMAL HIGH (ref 11.5–14.5)
WBC: 18.3 10*3/uL — ABNORMAL HIGH (ref 3.6–11.0)

## 2021-07-02 MED ORDER — METRONIDAZOLE IN SODIUM CHLORIDE (ISO-OSM) 500 MG/100 ML IV PIGGY BACK
500 mg/100 mL | Freq: Once | INTRAVENOUS | Status: AC
Start: 2021-07-02 — End: 2021-07-02
  Administered 2021-07-02: 18:00:00 via INTRAVENOUS

## 2021-07-02 MED ORDER — LACTATED RINGERS IV
INTRAVENOUS | Status: AC
Start: 2021-07-02 — End: 2021-07-03
  Administered 2021-07-02: 22:00:00 via INTRAVENOUS

## 2021-07-02 MED ORDER — SODIUM CHLORIDE 0.9 % INJECTION
INTRAMUSCULAR | Status: AC
Start: 2021-07-02 — End: ?

## 2021-07-02 MED ORDER — ONDANSETRON (PF) 4 MG/2 ML INJECTION
4 mg/2 mL | INTRAMUSCULAR | Status: DC | PRN
Start: 2021-07-02 — End: 2021-07-02
  Administered 2021-07-02: 20:00:00 via INTRAVENOUS

## 2021-07-02 MED ORDER — HYDROMORPHONE (PF) 1 MG/ML IJ SOLN
1 mg/mL | INTRAMUSCULAR | Status: AC
Start: 2021-07-02 — End: 2021-07-03

## 2021-07-02 MED ORDER — LACTATED RINGERS IV
INTRAVENOUS | Status: DC
Start: 2021-07-02 — End: 2021-07-02
  Administered 2021-07-02: 16:00:00 via INTRAVENOUS

## 2021-07-02 MED ORDER — SODIUM CHLORIDE 0.9 % IJ SYRG
Freq: Three times a day (TID) | INTRAMUSCULAR | Status: DC
Start: 2021-07-02 — End: 2021-07-02

## 2021-07-02 MED ORDER — PROCHLORPERAZINE EDISYLATE 5 MG/ML INJECTION
5 mg/mL | INTRAMUSCULAR | Status: AC
Start: 2021-07-02 — End: 2021-07-03

## 2021-07-02 MED ORDER — ALBUMIN, HUMAN 5 % IV
5 % | INTRAVENOUS | Status: DC | PRN
Start: 2021-07-02 — End: 2021-07-02
  Administered 2021-07-02: 17:00:00 via INTRAVENOUS

## 2021-07-02 MED ORDER — BUPIVACAINE-EPINEPHRINE (PF) 0.5 %-1:200,000 IJ SOLN
0.5 %-1:200,000 | INTRAMUSCULAR | Status: DC | PRN
Start: 2021-07-02 — End: 2021-07-02
  Administered 2021-07-02: 18:00:00 via SUBCUTANEOUS

## 2021-07-02 MED ORDER — MIDAZOLAM 1 MG/ML IJ SOLN
1 mg/mL | INTRAMUSCULAR | Status: AC
Start: 2021-07-02 — End: ?

## 2021-07-02 MED ORDER — PROCHLORPERAZINE EDISYLATE 5 MG/ML INJECTION
5 mg/mL | Freq: Once | INTRAMUSCULAR | Status: AC
Start: 2021-07-02 — End: 2021-07-02
  Administered 2021-07-02: 23:00:00 via INTRAVENOUS

## 2021-07-02 MED ORDER — MIDAZOLAM 1 MG/ML IJ SOLN
1 mg/mL | INTRAMUSCULAR | Status: DC | PRN
Start: 2021-07-02 — End: 2021-07-02
  Administered 2021-07-02: 17:00:00 via INTRAVENOUS

## 2021-07-02 MED ORDER — BUPIVACAINE-EPINEPHRINE (PF) 0.5 %-1:200,000 IJ SOLN
0.5 %-1:200,000 | INTRAMUSCULAR | Status: AC
Start: 2021-07-02 — End: ?

## 2021-07-02 MED ORDER — SUCCINYLCHOLINE CHLORIDE 200 MG/10 ML (20 MG/ML) INTRAVENOUS SYRINGE
200 mg/10 mL (20 mg/mL) | INTRAVENOUS | Status: AC
Start: 2021-07-02 — End: ?

## 2021-07-02 MED ORDER — PROPOFOL 10 MG/ML IV EMUL
10 mg/mL | INTRAVENOUS | Status: AC
Start: 2021-07-02 — End: ?

## 2021-07-02 MED ORDER — HYDROMORPHONE (PF) 2 MG/ML IJ SOLN
2 mg/mL | INTRAMUSCULAR | Status: DC | PRN
Start: 2021-07-02 — End: 2021-07-02
  Administered 2021-07-02: 21:00:00 via INTRAVENOUS

## 2021-07-02 MED ORDER — CELECOXIB 200 MG CAP
200 mg | Freq: Once | ORAL | Status: AC
Start: 2021-07-02 — End: 2021-07-02
  Administered 2021-07-02: 15:00:00 via ORAL

## 2021-07-02 MED ORDER — ONDANSETRON 4 MG TAB, RAPID DISSOLVE
4 mg | ORAL | Status: DC | PRN
Start: 2021-07-02 — End: 2021-07-06
  Administered 2021-07-03: 06:00:00 via ORAL

## 2021-07-02 MED ORDER — MAGNESIUM SULFATE 2 GRAM/50 ML IVPB
2 gram/50 mL (4 %) | INTRAVENOUS | Status: AC
Start: 2021-07-02 — End: ?

## 2021-07-02 MED ORDER — SUGAMMADEX 100 MG/ML INTRAVENOUS SOLUTION
100 mg/mL | INTRAVENOUS | Status: AC
Start: 2021-07-02 — End: ?

## 2021-07-02 MED ORDER — ROCURONIUM 10 MG/ML IV
10 mg/mL | INTRAVENOUS | Status: DC | PRN
Start: 2021-07-02 — End: 2021-07-02
  Administered 2021-07-02: 17:00:00 via INTRAVENOUS

## 2021-07-02 MED ORDER — DEXAMETHASONE SODIUM PHOSPHATE 4 MG/ML IJ SOLN
4 mg/mL | INTRAMUSCULAR | Status: AC
Start: 2021-07-02 — End: ?

## 2021-07-02 MED ORDER — WATER FOR INJECTION, STERILE INJECTION
1 gram | Freq: Once | INTRAMUSCULAR | Status: AC
Start: 2021-07-02 — End: 2021-07-02
  Administered 2021-07-02: 18:00:00 via INTRAVENOUS

## 2021-07-02 MED ORDER — ONDANSETRON (PF) 4 MG/2 ML INJECTION
4 mg/2 mL | INTRAMUSCULAR | Status: AC
Start: 2021-07-02 — End: 2021-07-03

## 2021-07-02 MED ORDER — KETAMINE 50 MG/5 ML (10 MG/ML) IN NS IV SYRINGE
50 mg/5 mL (10 mg/mL) | INTRAVENOUS | Status: AC
Start: 2021-07-02 — End: ?

## 2021-07-02 MED ORDER — KETAMINE 50 MG/5 ML (10 MG/ML) IN NS IV SYRINGE
50 mg/5 mL (10 mg/mL) | INTRAVENOUS | Status: DC | PRN
Start: 2021-07-02 — End: 2021-07-02
  Administered 2021-07-02: 18:00:00 via INTRAVENOUS

## 2021-07-02 MED ORDER — SODIUM CHLORIDE 0.9 % IJ SYRG
INTRAMUSCULAR | Status: DC | PRN
Start: 2021-07-02 — End: 2021-07-02

## 2021-07-02 MED ORDER — DEXAMETHASONE SODIUM PHOSPHATE 4 MG/ML IJ SOLN
4 mg/mL | INTRAMUSCULAR | Status: DC | PRN
Start: 2021-07-02 — End: 2021-07-02
  Administered 2021-07-02: 17:00:00 via INTRAVENOUS

## 2021-07-02 MED ORDER — NALOXEGOL 25 MG TABLET
25 mg | Freq: Once | ORAL | Status: AC
Start: 2021-07-02 — End: 2021-07-02
  Administered 2021-07-02: 15:00:00 via ORAL

## 2021-07-02 MED ORDER — FENTANYL CITRATE (PF) 50 MCG/ML IJ SOLN
50 mcg/mL | INTRAMUSCULAR | Status: DC | PRN
Start: 2021-07-02 — End: 2021-07-02
  Administered 2021-07-02 (×2): via INTRAVENOUS

## 2021-07-02 MED ORDER — DEXMEDETOMIDINE 100 MCG/ML IV SOLN
100 mcg/mL | INTRAVENOUS | Status: DC | PRN
Start: 2021-07-02 — End: 2021-07-02
  Administered 2021-07-02 (×3): via INTRAVENOUS

## 2021-07-02 MED ORDER — ROCURONIUM 10 MG/ML IV
10 mg/mL | INTRAVENOUS | Status: AC
Start: 2021-07-02 — End: ?

## 2021-07-02 MED ORDER — FENTANYL CITRATE (PF) 50 MCG/ML IJ SOLN
50 mcg/mL | Freq: Once | INTRAMUSCULAR | Status: AC
Start: 2021-07-02 — End: 2021-07-02
  Administered 2021-07-02: 23:00:00 via INTRAVENOUS

## 2021-07-02 MED ORDER — PHENYLEPHRINE IN 0.9 % SODIUM CL (40 MCG/ML) IV SYRINGE
0.4 mg/10 mL (40 mcg/mL) | INTRAVENOUS | Status: DC | PRN
Start: 2021-07-02 — End: 2021-07-02
  Administered 2021-07-02: 19:00:00 via INTRAVENOUS

## 2021-07-02 MED ORDER — ROCURONIUM 10 MG/ML IV
10 mg/mL | INTRAVENOUS | Status: DC | PRN
Start: 2021-07-02 — End: 2021-07-02
  Administered 2021-07-02 (×6): via INTRAVENOUS

## 2021-07-02 MED ORDER — ONDANSETRON (PF) 4 MG/2 ML INJECTION
4 mg/2 mL | Freq: Four times a day (QID) | INTRAMUSCULAR | Status: DC | PRN
Start: 2021-07-02 — End: 2021-07-06
  Administered 2021-07-02 – 2021-07-03 (×2): via INTRAVENOUS

## 2021-07-02 MED ORDER — LIDOCAINE (PF) 20 MG/ML (2 %) IJ SOLN
20 mg/mL (2 %) | INTRAMUSCULAR | Status: DC | PRN
Start: 2021-07-02 — End: 2021-07-02
  Administered 2021-07-02: 17:00:00 via INTRAVENOUS

## 2021-07-02 MED ORDER — ACETAMINOPHEN 500 MG TAB
500 mg | Freq: Once | ORAL | Status: AC
Start: 2021-07-02 — End: 2021-07-02
  Administered 2021-07-02: 15:00:00 via ORAL

## 2021-07-02 MED ORDER — SUGAMMADEX 100 MG/ML INTRAVENOUS SOLUTION
100 mg/mL | INTRAVENOUS | Status: DC | PRN
Start: 2021-07-02 — End: 2021-07-02
  Administered 2021-07-02: 21:00:00 via INTRAVENOUS

## 2021-07-02 MED ORDER — LIDOCAINE (PF) 20 MG/ML (2 %) IJ SOLN
20 mg/mL (2 %) | INTRAMUSCULAR | Status: AC
Start: 2021-07-02 — End: ?

## 2021-07-02 MED ORDER — PROPOFOL 10 MG/ML IV EMUL
10 mg/mL | INTRAVENOUS | Status: DC | PRN
Start: 2021-07-02 — End: 2021-07-02
  Administered 2021-07-02: 17:00:00 via INTRAVENOUS

## 2021-07-02 MED ORDER — BUPIVACAINE-EPINEPHRINE (PF) 0.5 %-1:200,000 IJ SOLN
0.5 %-1:200,000 | Freq: Once | INTRAMUSCULAR | Status: DC
Start: 2021-07-02 — End: 2021-07-02

## 2021-07-02 MED ORDER — MAGNESIUM SULFATE 2 GRAM/50 ML IVPB
2 gram/50 mL (4 %) | INTRAVENOUS | Status: DC | PRN
Start: 2021-07-02 — End: 2021-07-02
  Administered 2021-07-02: 17:00:00 via INTRAVENOUS

## 2021-07-02 MED ORDER — FENTANYL CITRATE (PF) 50 MCG/ML IJ SOLN
50 mcg/mL | INTRAMUSCULAR | Status: AC
Start: 2021-07-02 — End: ?

## 2021-07-02 MED ORDER — LIDOCAINE 8 MG/ML INFUSION
8 mg/mL (0. %) | INTRAVENOUS | Status: DC | PRN
Start: 2021-07-02 — End: 2021-07-02
  Administered 2021-07-02: 18:00:00 via INTRAVENOUS

## 2021-07-02 MED ORDER — KETOROLAC TROMETHAMINE 30 MG/ML INJECTION
30 mg/mL (1 mL) | INTRAMUSCULAR | Status: DC
Start: 2021-07-02 — End: 2021-07-02

## 2021-07-02 MED ORDER — LACTATED RINGERS IV
INTRAVENOUS | Status: DC | PRN
Start: 2021-07-02 — End: 2021-07-02
  Administered 2021-07-02 (×2): via INTRAVENOUS

## 2021-07-02 MED FILL — SUCCINYLCHOLINE CHLORIDE 200 MG/10 ML (20 MG/ML) INTRAVENOUS SYRINGE: 200 mg/10 mL (20 mg/mL) | INTRAVENOUS | Qty: 10

## 2021-07-02 MED FILL — HYDROMORPHONE (PF) 1 MG/ML IJ SOLN: 1 mg/mL | INTRAMUSCULAR | Qty: 1

## 2021-07-02 MED FILL — SODIUM CHLORIDE 0.9 % INJECTION: INTRAMUSCULAR | Qty: 10

## 2021-07-02 MED FILL — DEXAMETHASONE SODIUM PHOSPHATE 4 MG/ML IJ SOLN: 4 mg/mL | INTRAMUSCULAR | Qty: 2

## 2021-07-02 MED FILL — ONDANSETRON (PF) 4 MG/2 ML INJECTION: 4 mg/2 mL | INTRAMUSCULAR | Qty: 2

## 2021-07-02 MED FILL — MOVANTIK 25 MG TABLET: 25 mg | ORAL | Qty: 1

## 2021-07-02 MED FILL — FENTANYL CITRATE (PF) 50 MCG/ML IJ SOLN: 50 mcg/mL | INTRAMUSCULAR | Qty: 2

## 2021-07-02 MED FILL — ROCURONIUM 10 MG/ML IV: 10 mg/mL | INTRAVENOUS | Qty: 15

## 2021-07-02 MED FILL — MIDAZOLAM 1 MG/ML IJ SOLN: 1 mg/mL | INTRAMUSCULAR | Qty: 2

## 2021-07-02 MED FILL — LIDOCAINE (PF) 20 MG/ML (2 %) IJ SOLN: 20 mg/mL (2 %) | INTRAMUSCULAR | Qty: 5

## 2021-07-02 MED FILL — ROCURONIUM 10 MG/ML IV: 10 mg/mL | INTRAVENOUS | Qty: 5

## 2021-07-02 MED FILL — CEFAZOLIN 1 GRAM SOLUTION FOR INJECTION: 1 gram | INTRAMUSCULAR | Qty: 2000

## 2021-07-02 MED FILL — SUCCINYLCHOLINE CHLORIDE 200 MG/10 ML (20 MG/ML) INTRAVENOUS SYRINGE: 200 mg/10 mL (20 mg/mL) | INTRAVENOUS | Qty: 30

## 2021-07-02 MED FILL — MAGNESIUM SULFATE 2 GRAM/50 ML IVPB: 2 gram/50 mL (4 %) | INTRAVENOUS | Qty: 50

## 2021-07-02 MED FILL — BUPIVACAINE-EPINEPHRINE (PF) 0.5 %-1:200,000 IJ SOLN: 0.5 %-1:200,000 | INTRAMUSCULAR | Qty: 30

## 2021-07-02 MED FILL — LACTATED RINGERS IV: INTRAVENOUS | Qty: 1000

## 2021-07-02 MED FILL — DIPRIVAN 10 MG/ML INTRAVENOUS EMULSION: 10 mg/mL | INTRAVENOUS | Qty: 40

## 2021-07-02 MED FILL — BRIDION 100 MG/ML INTRAVENOUS SOLUTION: 100 mg/mL | INTRAVENOUS | Qty: 4

## 2021-07-02 MED FILL — CELECOXIB 200 MG CAP: 200 mg | ORAL | Qty: 1

## 2021-07-02 MED FILL — ACETAMINOPHEN 500 MG TAB: 500 mg | ORAL | Qty: 2

## 2021-07-02 MED FILL — PROCHLORPERAZINE EDISYLATE 5 MG/ML INJECTION: 5 mg/mL | INTRAMUSCULAR | Qty: 2

## 2021-07-02 MED FILL — METRO I.V. 500 MG/100 ML INTRAVENOUS PIGGYBACK: 500 mg/100 mL | INTRAVENOUS | Qty: 100

## 2021-07-02 MED FILL — KETAMINE 50 MG/5 ML (10 MG/ML) IN NS IV SYRINGE: 50 mg/5 mL (10 mg/mL) | INTRAVENOUS | Qty: 5

## 2021-07-02 NOTE — Op Note (Signed)
To be dictated.

## 2021-07-02 NOTE — Interval H&P Note (Signed)
3:28 PM  SPOKE TO PATIENT'S FAMILY (Chenille Meldrum) TO UPDATE ON PATIENT'S SURGICAL STATUS

## 2021-07-02 NOTE — Interval H&P Note (Signed)
Called and updated family on procedure start and patient progress, spoke with Chenille, patient's mother, at [phone]

## 2021-07-02 NOTE — Progress Notes (Signed)
 TRANSFER - OUT REPORT:    Verbal report given to [facility], RN(name) on [employer]  being transferred to 5E(unit) for routine post - op       Report consisted of patient's Situation, Background, Assessment and   Recommendations(SBAR).     Information from the following report(s) OR Summary, Procedure Summary, Intake/Output, MAR, and Cardiac Rhythm Sinus  was reviewed with the receiving nurse.    Lines:   Peripheral IV 07/02/21 Left Antecubital (Active)   Site Assessment Clean, dry, & intact 07/02/21 1709   Phlebitis Assessment 0 07/02/21 1709   Infiltration Assessment 0 07/02/21 1709   Dressing Status Clean, dry, & intact 07/02/21 1709   Dressing Type Transparent 07/02/21 1709   Hub Color/Line Status Blue;Infusing 07/02/21 1709   Action Taken Open ports on tubing capped 07/02/21 1709   Alcohol Cap Used Yes 07/02/21 1709       Peripheral IV 07/02/21 Right Hand (Active)   Site Assessment Clean, dry, & intact 07/02/21 1709   Phlebitis Assessment 0 07/02/21 1709   Infiltration Assessment 0 07/02/21 1709   Dressing Status Clean, dry, & intact 07/02/21 1709   Dressing Type Transparent 07/02/21 1709   Hub Color/Line Status Infusing 07/02/21 1709   Action Taken Open ports on tubing capped 07/02/21 1709   Alcohol Cap Used Yes 07/02/21 1709        Opportunity for questions and clarification was provided.      Patient transported with:   [employer]

## 2021-07-02 NOTE — Interval H&P Note (Signed)
Left ureteral stent placed 1302 ref: [id number] Lot: [id number] 63frx70cm    Right ureteral stent placed 1304 ref: [id number] Lot: [id number] 65frx70cm

## 2021-07-02 NOTE — Anesthesia Pre-Procedure Evaluation (Signed)
Relevant Problems   RESPIRATORY SYSTEM   (+) Asthma      NEUROLOGY   (+) Depression       Anesthetic History   No history of anesthetic complications            Review of Systems / Medical History  Patient summary reviewed, nursing notes reviewed and pertinent labs reviewed    Pulmonary            Asthma        Neuro/Psych         Psychiatric history     Cardiovascular  Within defined limits                Exercise tolerance: >4 METS     GI/Hepatic/Renal  Within defined limits              Endo/Other  Within defined limits           Other Findings   Comments: Crohn's  Sickle cell trait            Physical Exam    Airway  Mallampati: I  TM Distance: > 6 cm  Neck ROM: normal range of motion   Mouth opening: Normal     Cardiovascular  Regular rate and rhythm,  S1 and S2 normal,  no murmur, click, rub, or gallop             Dental  No notable dental hx       Pulmonary  Breath sounds clear to auscultation               Abdominal  GI exam deferred       Other Findings            Anesthetic Plan    ASA: 3  Anesthesia type: general          Induction: Intravenous  Anesthetic plan and risks discussed with: Patient

## 2021-07-02 NOTE — Op Note (Signed)
Brief Postoperative Note    Patient: Denise Huber  Date of Birth: [birth year]  MRN: [id number]    Date of Procedure: 07/02/2021     Pre-Op Diagnosis:  Crohn's disease with stricture  Post-Op Diagnosis: Crohn's disease with stricture    Procedure:  Hand-assisted laparoscopic ileocecal resection and ileocolostomy  Surgeon:  Theresia Majors, MD  Surgical Assistant:  Merri Ray, RN  Anesthesia: General endotracheal  Specimens:   ID Type Source Tests Collected by Time Destination   Terminal ileum and cecum Fresh Colon  Theresia Majors, MD 07/02/2021 1540 Pathology   Drains:  None  Estimated Blood Loss:  150 mL  Crystalloid:  1800 mL  Albumin:  250 mL  Urine:  250 mL  Complications: None apparent  Implants:  None  Findings:  Terminal ileal and cecal inflammation with stricture.  No other areas of stricture or active Crohn's disease.      Electronically Signed by Theresia Majors, MD

## 2021-07-02 NOTE — Op Note (Signed)
Operative Note    Patient: Denise Huber  Date of Birth: [birth year]  MRN: [id number]    Date of Procedure: 07/02/2021     Pre-Op Diagnosis: CROHN'S DISEASE WITH STRICTURE    Post-Op Diagnosis: Same as preoperative diagnosis.      Procedure(s):    CYSTOSCOPY, PLACEMENT OF BILATERAL URETERAL CATHETERS    Surgeon(s):  Ghaemmaghami, Worthy Rancher, MD  Ignatius Specking, MD    Surgical Assistant: None    Anesthesia: General     Estimated Blood Loss (mL):  Minimal    Complications: None    Specimens: * No specimens in log *     Implants: * No implants in log *    Drains: * No LDAs found *    Findings: Had some squamous metaplasia of trigone otherwise normal urethra and bladder orthotopic ureteral orifices    Detailed Description of Procedure:   Patient taken to the operating room placed on the operating room table.  She received antibiotics per general surgery's protocol.  She was positioned in lithotomy positioning prepped and draped in standard sterile fashion a preprocedure timeout was performed everyone was in agreement.  21 French rigid cystoscope was assembled to perform cystourethroscopy.  She had some squamous metaplasia of her trigone otherwise no masses or abnormalities were seen she had bilateral orthotopic ureteral orifices.  An open-ended catheter was used to direct a Bentson wire up the left ureter this was done under feel until it fell like it was in the renal pelvis.  Then I advanced the open-ended catheter about 20 cm up into the left ureter.  The wire was removed this procedure was repeated for the right ureteral orifice placing another open-ended catheter up that ureter about 20 cm as well.  Once this was performed care was taken to leave and passed the catheter through the scope.  A catheter was placed in the bladder and the balloon was inflated with sterile water.  I pulled the balloon to the trigone well buttressing the ureteral stents to ensure that that did not pull them out.  I then clipped the  catheter tubing to both the ureteral stents and fashioned a Layla Barter adapter to drain all the urine into a urine bag.  The case was then turned over to general surgery.  Electronically Signed by Ignatius Specking, MD on 07/02/2021 at 1:07 PM

## 2021-07-02 NOTE — Interval H&P Note (Signed)
Seprafilm adhesion barrier  Lot # [id number]  EXP: 08/08/2023

## 2021-07-02 NOTE — Anesthesia Post-Procedure Evaluation (Signed)
Post-Anesthesia Evaluation and Assessment    Patient: Denise Huber MRN: [id number]  SSN: [id number]    Date of Birth: Jul 24, 1986  Age: 35 y.o.  Sex: female      I have evaluated the patient and they are stable and ready for discharge from the PACU.     Cardiovascular Function/Vital Signs  INITIAL Post-op Vital signs:   Vitals Value Taken Time   BP 120/75 07/02/21 2045   Temp 36.9 ??C (98.5 ??F) 07/02/21 1743   Pulse 81 07/02/21 2054   Resp 18 07/02/21 2054   SpO2 98 % 07/02/21 2054   Vitals shown include unvalidated device data.    Patient is status post General anesthesia for Procedure(s):  HAND ASSISTED LAPAROSCOPIC, ILEOCECAL RESECTION AND ILEOCOLOSTOMY  (E R A S)  CYSTOSCOPY, PLACEMENT OF BILATERAL URETERAL CATHETERS.    Nausea/Vomiting: None    Postoperative hydration reviewed and adequate.    Pain:   Managed    Neurological Status:   At baseline    Mental Status, Level of Consciousness: Alert and  oriented to person, place, and time    Pulmonary Status:   Adequate oxygenation and airway patent    Complications related to anesthesia: None    Post-anesthesia assessment completed. No concerns    Signed By: Ronda Fairly, DO     July 02, 2021

## 2021-07-02 NOTE — Op Note (Signed)
[facility]  OPERATIVE REPORT    Name:  Denise Huber, Denise Huber  MR#:  [id number]  DOB:  09-28-1986  ACCOUNT #:  [id number]    DATE OF SERVICE:  07/02/2021    PREOPERATIVE DIAGNOSIS:  Crohn's disease with stricture.    POSTOPERATIVE DIAGNOSIS:  Crohn's disease with stricture.    PROCEDURES PERFORMED:  Hand-assisted laparoscopic ileocecal resection and ileocolostomy.    SURGEON:  Theresia Majors, MD    ASSISTANT:  Merri Ray, RN    ANESTHESIA:  General endotracheal    SPECIMENS REMOVED:  Terminal ileum and cecum    TUBES AND DRAINS:  None    ESTIMATED BLOOD LOSS:  150 mL    IV FLUIDS:  Crystalloid 1800 mL; albumin 250 mL.    URINE OUTPUT:  250 mL    COMPLICATIONS:  None apparent    IMPLANTS:  None    INDICATIONS FOR PROCEDURE:  The patient is a 35 year old female who was diagnosed with Crohn's disease in November 2019.  She was treated with Humira, which she receives every 2 weeks.  Her most recent dose was administered on 05/31/2021.  She has been hospitalized multiple times, including twice within the last year for Crohn's disease exacerbations.  She continues to experience abdominal pain, which is usually mostly on the right and sometimes also on the left.  Since the diagnosis of Crohn's disease was made, she has undergone multiple imaging procedures.  CT scans performed in 2020 and 2021 revealed active inflammation and apparent stricture formation in the terminal ileum and cecum.  The most recent study was a CT enterography performed on 06/17/2021.  That study was interpreted as revealing inflammation involving the terminal ileum and the base of the cecum as well as increased involvement of the appendix (compared to 07/26/2020).  There was no other apparent small intestinal colonic Crohn's disease activity.  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  The risks of the  operation were discussed in detail, and the patient agreed to proceed.    OPERATIVE FINDINGS:  There was terminal ileal and cecal inflammation with stricture.  There were no other areas of stricture or active Crohn's disease.    PROCEDURE:  After informed consent was obtained, the patient was taken to the operating room where standard monitoring devices were attached and adequate general endotracheal anesthesia was induced.  She was then positioned in lithotomy with the lower extremities fitted with pneumatic compression stockings and supported by the padded hydraulic Allen stirrups.  The perineum was prepped and draped in the usual sterile fashion then Marshell Levan III, MD performed cystoscopy and placement of bilateral ureteral catheters and a Foley catheter.  The urologic procedure had been requested in order to facilitate intraoperative identification of the ureters.  Once it was completed, the patient was repositioned supine with the left upper extremity tucked and the right upper extremity extended on an arm board.  An orogastric tube was placed by the anesthetist.  2 g of cefazolin and 500 mg of metronidazole were administered parenterally.  The patient had also undergone mechanical and antibiotic bowel preparation at home on the day before the procedure.  The abdomen was prepped and draped in the usual sterile fashion.    0.5% bupivacaine with epinephrine was infiltrated subcutaneously prior to making skin incisions.  The abdomen was entered through a periumbilical midline incision that was extended through the subcutaneous adipose tissue and  linea alba using the electrocautery.  The peritoneum was opened.  The GelPort was put in place and would function as both wound retractor and wound protector.  Pneumoperitoneum was achieved through a 12-mm port inserted through the GelPort.  The 5-mm 30-degree laparoscope was then introduced and used to explore the abdomen visually.  Next, under direct vision, a 5-mm  port was placed in the subxiphoid midline and a 12-mm port was placed in the left upper quadrant.  The non-dominant hand was introduced through the GelPort.  The laparoscope was moved to the subxiphoid site.  The articulating Enseal was used for most of the intracorporeal dissection and it was deployed through the left upper quadrant port.    The abdomen was explored by visualization and palpation.  The area of disease was easily identified.  The mobilization of the right colon and terminal ileum was performed primarily using the Enseal.  Care was taken to avoid injuring the ureters, ovaries, and uninvolved parts of the small intestine.  The small intestine was run and found to be without evidence of active disease other than at the terminal ileum itself.  The distal part of the ileum was dilated secondary to partial obstruction.  The proximal small bowel was normal in caliber.  The Enseal was used to separate the omentum from the proximal transverse and ascending colon.  The cecum and terminal ileum were gradually freed of their peritoneal attachments and reflected medially.  Dissection was carried toward the root of the mesentery to create sufficient mobility, which ultimately allowed the extracorporealization of that portion of the bowel.    With the bowel extracorporealized through the GelPort, the mesentery was divided gradually and very close to the bowel wall.  Some additional division of mesentery was performed using the Enseal and the electrocautery.  Multiple structures were sharply divided between clamps and then ligated with 2-0 silk sutures and 2-0 silk ties.  The diseased segment was isolated.  The distal ileum was placed adjacent to the ascending colon and each of these structures were opened using the electrocautery.  The suction was used to evacuate retained contents then a side-to-side (functional end-to-end) ileocolic anastomosis was created using two firings of the GIA-75 stapling device with a  blue cartridge longitudinally followed by one transverse firing of the GIA 100 stapling device with a blue cartridge.  This transverse firing completed the resection of the specimen, which was taken to the back table and opened revealing the disease, no apparent malignancy, and grossly normal margins.    Returning to the abdomen after changing gown and gloves, the anastomosis was inspected.  It was selectively reinforced in multiple locations using seromuscular 3-0 silk sutures.  Some of the sutures were placed at the crotch of the anastomosis.  Others were used to dunk the transverse staple line ends.  The rent in the mesentery was closed using running and interrupted 2-0 Vicryl sutures.  Final inspection of the anastomosis revealed it to be apparently well vascularized, under no undue tension, and widely patent.  The bowel was returned to the abdominal cavity.  Pneumoperitoneum was restored.  The abdomen was reexplored and no bleeding or injury was identified.  The omentum was draped over the anastomosis.  The laparoscopic instruments were removed.  Four small sheets of Seprafilm were placed anterior to the omentum.  No Seprafilm was placed directly on the anastomosis.  The GelPort was removed.    Per protocol, gowns and gloves were changed and the dedicated closing instrument set  was opened.  The periumbilical midline fascial closure was performed using two running #1 PDS sutures.  The subcutaneous portions of all three wounds were irrigated with saline, and the subcutaneous adipose tissue in the periumbilical midline wound was reapproximated using 2-0 Vicryl sutures.  4-0 Monocryl subcuticular sutures were used for skin closure at all three wounds and then Dermabond was applied.  The orogastric tube was removed as were the ureteral catheters.  The Foley catheter was left in place.    There were no apparent complications, and the patient appeared to have tolerated the procedure well.  At its conclusion, she was  extubated and transported in stable condition to the recovery room.  All sponge, needle, and instrument counts were correct.        Theresia Majors, MD      [id number]  D:  07/03/2021 23:50  T:  07/04/2021 2:12  JOB #:  [id number]  CC:  Nira Retort, MD       Dietrich Pates, DO       Theresia Majors, MD
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
## 2021-07-03 LAB — BASIC METABOLIC PANEL
Anion Gap: 8 mmol/L (ref 5–15)
BUN: 6 MG/DL (ref 6–20)
Bun/Cre Ratio: 9 — ABNORMAL LOW (ref 12–20)
CO2: 23 mmol/L (ref 21–32)
Calcium: 8.4 MG/DL — ABNORMAL LOW (ref 8.5–10.1)
Chloride: 105 mmol/L (ref 97–108)
Creatinine: 0.65 MG/DL (ref 0.55–1.02)
EGFR IF NonAfrican American: >60 mL/min/{1.73_m2} (ref >60)
GFR African American: >60 mL/min/{1.73_m2} (ref >60)
Glucose: 109 mg/dL — ABNORMAL HIGH (ref 65–100)
Potassium: 4.3 mmol/L (ref 3.5–5.1)
Sodium: 136 mmol/L (ref 136–145)

## 2021-07-03 LAB — CBC
Hematocrit: 31.7 % — ABNORMAL LOW (ref 35.0–47.0)
Hemoglobin: 10.8 g/dL — ABNORMAL LOW (ref 11.5–16.0)
MCH: 27.2 PG (ref 26.0–34.0)
MCHC: 34.1 g/dL (ref 30.0–36.5)
MCV: 79.8 FL — ABNORMAL LOW (ref 80.0–99.0)
MPV: 9.7 FL (ref 8.9–12.9)
NRBC Absolute: 0 10*3/uL (ref 0.00–0.01)
Nucleated RBCs: 0 PER 100 WBC
Platelets: 364 10*3/uL (ref 150–400)
RBC: 3.97 M/uL (ref 3.80–5.20)
RDW: 15.1 % — ABNORMAL HIGH (ref 11.5–14.5)
WBC: 16 10*3/uL — ABNORMAL HIGH (ref 3.6–11.0)

## 2021-07-03 LAB — MAGNESIUM
Magnesium: 2.6 mg/dL — ABNORMAL HIGH (ref 1.6–2.4)
Magnesium: 2.6 mg/dL — ABNORMAL HIGH (ref 1.6–2.4)

## 2021-07-03 LAB — PHOSPHORUS
Phosphorus: 2.7 MG/DL (ref 2.6–4.7)
Phosphorus: 2.7 MG/DL (ref 2.6–4.7)

## 2021-07-03 LAB — METABOLIC PANEL, BASIC
Anion gap: 8 mmol/L (ref 5–15)
BUN/Creatinine ratio: 9 — ABNORMAL LOW (ref 12–20)
BUN: 6 MG/DL (ref 6–20)
CO2: 23 mmol/L (ref 21–32)
Calcium: 8.4 MG/DL — ABNORMAL LOW (ref 8.5–10.1)
Chloride: 105 mmol/L (ref 97–108)
Creatinine: 0.65 MG/DL (ref 0.55–1.02)
GFR est AA: >60 mL/min/{1.73_m2} (ref >60)
GFR est non-AA: >60 mL/min/{1.73_m2} (ref >60)
Glucose: 109 mg/dL — ABNORMAL HIGH (ref 65–100)
Potassium: 4.3 mmol/L (ref 3.5–5.1)
Sodium: 136 mmol/L (ref 136–145)

## 2021-07-03 LAB — CBC W/O DIFF
ABSOLUTE NRBC: 0 10*3/uL (ref 0.00–0.01)
HCT: 31.7 % — ABNORMAL LOW (ref 35.0–47.0)
HGB: 10.8 g/dL — ABNORMAL LOW (ref 11.5–16.0)
MCH: 27.2 PG (ref 26.0–34.0)
MCHC: 34.1 g/dL (ref 30.0–36.5)
MCV: 79.8 FL — ABNORMAL LOW (ref 80.0–99.0)
MPV: 9.7 FL (ref 8.9–12.9)
NRBC: 0 PER 100 WBC
PLATELET: 364 10*3/uL (ref 150–400)
RBC: 3.97 M/uL (ref 3.80–5.20)
RDW: 15.1 % — ABNORMAL HIGH (ref 11.5–14.5)
WBC: 16 10*3/uL — ABNORMAL HIGH (ref 3.6–11.0)

## 2021-07-03 MED ORDER — SODIUM CHLORIDE 0.9 % IJ SYRG
Freq: Three times a day (TID) | INTRAMUSCULAR | Status: DC
Start: 2021-07-03 — End: 2021-07-06
  Administered 2021-07-03 – 2021-07-06 (×10): via INTRAVENOUS

## 2021-07-03 MED ORDER — SODIUM CHLORIDE 0.9 % IJ SYRG
INTRAMUSCULAR | Status: DC | PRN
Start: 2021-07-03 — End: 2021-07-06

## 2021-07-03 MED ORDER — HYDROMORPHONE 2 MG TAB
2 mg | ORAL | Status: DC | PRN
Start: 2021-07-03 — End: 2021-07-06
  Administered 2021-07-03 – 2021-07-04 (×6): via ORAL

## 2021-07-03 MED ORDER — LIDOCAINE 8 MG/ML INFUSION
8 mg/mL (0. %) | INTRAVENOUS | Status: DC
Start: 2021-07-03 — End: 2021-07-03

## 2021-07-03 MED ORDER — LIDOCAINE 8 MG/ML INFUSION
8 mg/mL (0. %) | INTRAVENOUS | Status: AC
Start: 2021-07-03 — End: 2021-07-02
  Administered 2021-07-03: 01:00:00 via INTRAVENOUS

## 2021-07-03 MED ORDER — LIDOCAINE 8 MG/ML INFUSION
8 mg/mL (0. %) | INTRAVENOUS | Status: AC
Start: 2021-07-03 — End: 2021-07-04
  Administered 2021-07-04: 08:00:00 via INTRAVENOUS

## 2021-07-03 MED ORDER — NALOXEGOL 12.5 MG TABLET
12.5 mg | Freq: Every day | ORAL | Status: DC
Start: 2021-07-03 — End: 2021-07-06
  Administered 2021-07-03 – 2021-07-06 (×3): via ORAL

## 2021-07-03 MED ORDER — KETOROLAC TROMETHAMINE 30 MG/ML INJECTION
30 mg/mL (1 mL) | Freq: Four times a day (QID) | INTRAMUSCULAR | Status: AC
Start: 2021-07-03 — End: 2021-07-03
  Administered 2021-07-03 (×4): via INTRAVENOUS

## 2021-07-03 MED ORDER — HYDROXYZINE 25 MG TAB
25 mg | Freq: Three times a day (TID) | ORAL | Status: DC | PRN
Start: 2021-07-03 — End: 2021-07-06

## 2021-07-03 MED ORDER — ENOXAPARIN 40 MG/0.4 ML SUB-Q SYRINGE
40 mg/0.4 mL | Freq: Every day | SUBCUTANEOUS | Status: DC
Start: 2021-07-03 — End: 2021-07-06
  Administered 2021-07-03 – 2021-07-05 (×3): via SUBCUTANEOUS

## 2021-07-03 MED ORDER — CELECOXIB 100 MG CAP
100 mg | Freq: Two times a day (BID) | ORAL | Status: DC
Start: 2021-07-03 — End: 2021-07-06
  Administered 2021-07-04 – 2021-07-06 (×5): via ORAL

## 2021-07-03 MED ORDER — ACETAMINOPHEN 500 MG TAB
500 mg | Freq: Four times a day (QID) | ORAL | Status: DC
Start: 2021-07-03 — End: 2021-07-06
  Administered 2021-07-03 – 2021-07-06 (×11): via ORAL

## 2021-07-03 MED FILL — HYDROMORPHONE 2 MG TAB: 2 mg | ORAL | Qty: 1

## 2021-07-03 MED FILL — NORMAL SALINE FLUSH 0.9 % INJECTION SYRINGE: INTRAMUSCULAR | Qty: 40

## 2021-07-03 MED FILL — LIDOCAINE 8 MG/ML INFUSION: 8 mg/mL (0. %) | INTRAVENOUS | Qty: 250

## 2021-07-03 MED FILL — ONDANSETRON 4 MG TAB, RAPID DISSOLVE: 4 mg | ORAL | Qty: 1

## 2021-07-03 MED FILL — MOVANTIK 12.5 MG TABLET: 12.5 mg | ORAL | Qty: 2

## 2021-07-03 MED FILL — ACETAMINOPHEN 500 MG TAB: 500 mg | ORAL | Qty: 2

## 2021-07-03 MED FILL — ENOXAPARIN 40 MG/0.4 ML SUB-Q SYRINGE: 40 mg/0.4 mL | SUBCUTANEOUS | Qty: 0.4

## 2021-07-03 MED FILL — KETOROLAC TROMETHAMINE 30 MG/ML INJECTION: 30 mg/mL (1 mL) | INTRAMUSCULAR | Qty: 1

## 2021-07-03 MED FILL — ONDANSETRON (PF) 4 MG/2 ML INJECTION: 4 mg/2 mL | INTRAMUSCULAR | Qty: 2

## 2021-07-03 NOTE — Progress Notes (Signed)
Patient was not available when I made rounds.  Father Chris Haydinger

## 2021-07-03 NOTE — Progress Notes (Signed)
Foley catheter removed @ 1348    1525- patient voided , pt noticed small about of vaginal blood on tissue    1530- patient walked 1/2 lap around unit with this RN, tolerated well.

## 2021-07-03 NOTE — Consults (Signed)
Nutrition Education    Educated on Low Fiber diet.  Learners: Patient  Readiness: Acceptance  Method: Explanation and Handout  Response: Verbalizes Understanding  Contact name and number provided.    Tarren Ismail, RD  Contact via [employer]
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
## 2021-07-03 NOTE — Progress Notes (Signed)
General Daily Progress Note    Admission Date: 07/02/2021  Hospital Day 2  Post-Op Day 1  Subjective:   Had some emesis overnight.  Still a bit nauseated.      Objective:   Patient Vitals for the past 24 hrs:   BP Temp Pulse Resp SpO2 Height Weight   07/03/21 0812 108/68 98.6 ??F (37 ??C) 86 18 94 % -- --   07/03/21 0436 -- -- -- -- 97 % -- --   07/02/21 2318 112/68 98.2 ??F (36.8 ??C) 82 18 96 % -- --   07/02/21 2212 113/69 98.2 ??F (36.8 ??C) 82 20 97 % -- --   07/02/21 2120 121/74 98.8 ??F (37.1 ??C) 81 18 96 % -- --   07/02/21 2030 120/76 -- 84 15 99 % -- --   07/02/21 2015 123/76 -- 84 22 97 % -- --   07/02/21 1958 110/66 -- 83 19 98 % -- --   07/02/21 1943 109/70 -- 84 20 97 % -- --   07/02/21 1928 115/72 -- 83 20 96 % -- --   07/02/21 1913 113/69 -- 85 19 96 % -- --   07/02/21 1858 (!) 104/58 -- 84 14 97 % -- --   07/02/21 1843 108/63 -- 91 23 96 % -- --   07/02/21 1833 103/64 -- 88 16 97 % -- --   07/02/21 1813 101/63 -- 85 20 95 % -- --   07/02/21 1800 107/61 -- 88 17 95 % -- --   07/02/21 1743 119/70 98.5 ??F (36.9 ??C) 87 24 96 % -- --   07/02/21 1728 121/68 -- 84 24 97 % -- --   07/02/21 1723 116/62 -- 86 23 96 % -- --   07/02/21 1718 116/60 -- 84 28 96 % -- --   07/02/21 1713 114/62 -- 85 19 99 % -- --   07/02/21 1710 (!) 116/58 -- 86 26 100 % -- --   07/02/21 1709 (!) 112/58 98.4 ??F (36.9 ??C) 86 12 97 % -- --   07/02/21 1052 -- -- -- -- -- 5\' 10"  (1.778 m) 95.7 kg (210 lb 15.7 oz)   07/02/21 1048 111/73 98.2 ??F (36.8 ??C) 74 16 99 % -- --     No intake/output data recorded.  08/10 1901 - 08/12 0700  In: 2420 [I.V.:2420]  Out: 1325 [Urine:1175]    Physical Examination:  General Appearance - Somewhat uncomfortable.  Abdomen - Soft.  Appropriately tender.  Incisions clean, dry, intact, and without erythema.  GU -  Foley catheter draining normal-appearing urine.  Extremities - Pneumatic compression stockings in use.            Data Review   Recent Results (from the past 24 hour(s))   HCG URINE, QL. - POC    Collection  Time: 07/02/21 11:15 AM   Result Value Ref Range    Pregnancy test,urine (POC) Negative NEG     CBC W/O DIFF    Collection Time: 07/02/21  5:27 PM   Result Value Ref Range    WBC 18.3 (H) 3.6 - 11.0 K/uL    RBC 4.16 3.80 - 5.20 M/uL    HGB 11.6 11.5 - 16.0 g/dL    HCT 09/01/21 (L) 01.6 - 47.0 %    MCV 80.8 80.0 - 99.0 FL    MCH 27.9 26.0 - 34.0 PG    MCHC 34.5 30.0 - 36.5 g/dL    RDW 01.0 (H) 93.2 - 14.5 %  PLATELET 350 150 - 400 K/uL    MPV 9.4 8.9 - 12.9 FL    NRBC 0.0 0 PER 100 WBC    ABSOLUTE NRBC 0.00 0.00 - 0.01 K/uL   METABOLIC PANEL, BASIC    Collection Time: 07/02/21  5:27 PM   Result Value Ref Range    Sodium 137 136 - 145 mmol/L    Potassium 3.5 3.5 - 5.1 mmol/L    Chloride 106 97 - 108 mmol/L    CO2 23 21 - 32 mmol/L    Anion gap 8 5 - 15 mmol/L    Glucose 153 (H) 65 - 100 mg/dL    BUN 7 6 - 20 MG/DL    Creatinine 0.98 1.19 - 1.02 MG/DL    BUN/Creatinine ratio 8 (L) 12 - 20      GFR est AA >60 >60 ml/min/1.6m2    GFR est non-AA >60 >60 ml/min/1.72m2    Calcium 8.4 (L) 8.5 - 10.1 MG/DL   MAGNESIUM    Collection Time: 07/02/21  5:27 PM   Result Value Ref Range    Magnesium 2.7 (H) 1.6 - 2.4 mg/dL   PHOSPHORUS    Collection Time: 07/02/21  5:27 PM   Result Value Ref Range    Phosphorus 2.5 (L) 2.6 - 4.7 MG/DL   METABOLIC PANEL, BASIC    Collection Time: 07/03/21  3:48 AM   Result Value Ref Range    Sodium 136 136 - 145 mmol/L    Potassium 4.3 3.5 - 5.1 mmol/L    Chloride 105 97 - 108 mmol/L    CO2 23 21 - 32 mmol/L    Anion gap 8 5 - 15 mmol/L    Glucose 109 (H) 65 - 100 mg/dL    BUN 6 6 - 20 MG/DL    Creatinine 1.47 8.29 - 1.02 MG/DL    BUN/Creatinine ratio 9 (L) 12 - 20      GFR est AA >60 >60 ml/min/1.79m2    GFR est non-AA >60 >60 ml/min/1.38m2    Calcium 8.4 (L) 8.5 - 10.1 MG/DL   MAGNESIUM    Collection Time: 07/03/21  3:48 AM   Result Value Ref Range    Magnesium 2.6 (H) 1.6 - 2.4 mg/dL   PHOSPHORUS    Collection Time: 07/03/21  3:48 AM   Result Value Ref Range    Phosphorus 2.7 2.6 - 4.7 MG/DL   CBC  W/O DIFF    Collection Time: 07/03/21  3:48 AM   Result Value Ref Range    WBC 16.0 (H) 3.6 - 11.0 K/uL    RBC 3.97 3.80 - 5.20 M/uL    HGB 10.8 (L) 11.5 - 16.0 g/dL    HCT 56.2 (L) 13.0 - 47.0 %    MCV 79.8 (L) 80.0 - 99.0 FL    MCH 27.2 26.0 - 34.0 PG    MCHC 34.1 30.0 - 36.5 g/dL    RDW 86.5 (H) 78.4 - 14.5 %    PLATELET 364 150 - 400 K/uL    MPV 9.7 8.9 - 12.9 FL    NRBC 0.0 0 PER 100 WBC    ABSOLUTE NRBC 0.00 0.00 - 0.01 K/uL           Assessment:     Principal Problem:    Crohn's disease with complication (HCC) (12/14/2018)    Active Problems:    Crohn's disease with intestinal obstruction (HCC) (07/02/2021)        1 Day Post-Op s/p  hand-assisted laparoscopic ileocecal resection and ileocolostomy.    Hemodynamically stable.  Hemoglobin acceptable.  Urine output adequate.  Creatinine within normal limits.      Plan:   Continue clear liquid diet and Ensure Surgery.  Remove Foley catheter per protocol.  Ambulate.  Physical therapy.  Incentive spirometer.

## 2021-07-03 NOTE — Progress Notes (Signed)
 RUR: 4% Low     TOC: Anticipated discharge home; awaiting final PT recommendations. Patient's mother will provide transport home once medically stable. Follow-up with PCP/specialist     Primary Contact: Mother, Aalina Brege, [phone]    Care Management Interventions  PCP Verified by CM: Yes (Dr. Emelia Forth - last seen April 2022)  Mode of Transport at Discharge: Other (see comment) (Mother)  Transition of Care Consult (CM Consult): Discharge Planning  Discharge Durable Medical Equipment: No  Physical Therapy Consult: Yes  Occupational Therapy Consult: No  Speech Therapy Consult: No  Support Systems: Parent(s)  Confirm Follow Up Transport: Self  The Plan for Transition of Care is Related to the Following Treatment Goals : Home  Discharge Location  Patient Expects to be Discharged to:: Home with family assistance    Reason for Admission:  Crohn's disease with complication                    RUR Score:    4% Low               Plan for utilizing home health: TBD; awaiting PT final recommendations.        PCP: First and Last name:  Forth Emelia DEL, DO     Name of Practice:    Are you a current patient: Yes/No: Yes   Approximate date of last visit: April 2022   Can you participate in a virtual visit with your PCP: Yes                    Current Advanced Directive/Advance Care Plan: Full Code    Healthcare Decision Maker:   Click here to complete HealthCare Decision Makers including selection of the Healthcare Decision Maker Relationship (ie Primary)                  Transition of Care Plan:     Home    CM met with patient at bedside to introduce self and explain role. Patient lives with her 3 children in a 1 story home with 5 steps to enter. Patient was independent with ADL's, IADL's and ambulation at baseline. Patient does not own any DME. CM verified patient's PCP, demographics and insurance. Preferred pharmacy is [facility] on [address] in [place] with no barriers obtaining needed prescriptions.  Patient's mother will provide transport home once medically stable.     CM will continue to follow as needed.     Alan Sheldon, MSW   [phone]

## 2021-07-04 LAB — CBC WITH AUTO DIFFERENTIAL
Basophils %: 0 % (ref 0–1)
Basophils Absolute: 0 10*3/uL (ref 0.0–0.1)
Eosinophils %: 2 % (ref 0–7)
Eosinophils Absolute: 0.2 10*3/uL (ref 0.0–0.4)
Granulocyte Absolute Count: 0 10*3/uL (ref 0.00–0.04)
Hematocrit: 31.1 % — ABNORMAL LOW (ref 35.0–47.0)
Hemoglobin: 10.6 g/dL — ABNORMAL LOW (ref 11.5–16.0)
Immature Granulocytes: 0 % (ref 0.0–0.5)
Lymphocytes %: 29 % (ref 12–49)
Lymphocytes Absolute: 2.3 10*3/uL (ref 0.8–3.5)
MCH: 27.6 PG (ref 26.0–34.0)
MCHC: 34.1 g/dL (ref 30.0–36.5)
MCV: 81 FL (ref 80.0–99.0)
MPV: 9.7 FL (ref 8.9–12.9)
Monocytes %: 7 % (ref 5–13)
Monocytes Absolute: 0.6 10*3/uL (ref 0.0–1.0)
NRBC Absolute: 0 10*3/uL (ref 0.00–0.01)
Neutrophils %: 62 % (ref 32–75)
Neutrophils Absolute: 4.8 10*3/uL (ref 1.8–8.0)
Nucleated RBCs: 0 PER 100 WBC
Platelets: 320 10*3/uL (ref 150–400)
RBC: 3.84 M/uL (ref 3.80–5.20)
RDW: 15.4 % — ABNORMAL HIGH (ref 11.5–14.5)
WBC: 7.9 10*3/uL (ref 3.6–11.0)

## 2021-07-04 LAB — BASIC METABOLIC PANEL
Anion Gap: 4 mmol/L — ABNORMAL LOW (ref 5–15)
BUN: 6 MG/DL (ref 6–20)
Bun/Cre Ratio: 9 — ABNORMAL LOW (ref 12–20)
CO2: 26 mmol/L (ref 21–32)
Calcium: 8.3 MG/DL — ABNORMAL LOW (ref 8.5–10.1)
Chloride: 106 mmol/L (ref 97–108)
Creatinine: 0.68 MG/DL (ref 0.55–1.02)
EGFR IF NonAfrican American: >60 mL/min/{1.73_m2} (ref >60)
GFR African American: >60 mL/min/{1.73_m2} (ref >60)
Glucose: 87 mg/dL (ref 65–100)
Potassium: 4.1 mmol/L (ref 3.5–5.1)
Sodium: 136 mmol/L (ref 136–145)

## 2021-07-04 LAB — CBC WITH AUTOMATED DIFF
ABS. BASOPHILS: 0 10*3/uL (ref 0.0–0.1)
ABS. EOSINOPHILS: 0.2 10*3/uL (ref 0.0–0.4)
ABS. IMM. GRANS.: 0 10*3/uL (ref 0.00–0.04)
ABS. LYMPHOCYTES: 2.3 10*3/uL (ref 0.8–3.5)
ABS. MONOCYTES: 0.6 10*3/uL (ref 0.0–1.0)
ABS. NEUTROPHILS: 4.8 10*3/uL (ref 1.8–8.0)
ABSOLUTE NRBC: 0 10*3/uL (ref 0.00–0.01)
BASOPHILS: 0 % (ref 0–1)
EOSINOPHILS: 2 % (ref 0–7)
HCT: 31.1 % — ABNORMAL LOW (ref 35.0–47.0)
HGB: 10.6 g/dL — ABNORMAL LOW (ref 11.5–16.0)
IMMATURE GRANULOCYTES: 0 % (ref 0.0–0.5)
LYMPHOCYTES: 29 % (ref 12–49)
MCH: 27.6 PG (ref 26.0–34.0)
MCHC: 34.1 g/dL (ref 30.0–36.5)
MCV: 81 FL (ref 80.0–99.0)
MONOCYTES: 7 % (ref 5–13)
MPV: 9.7 FL (ref 8.9–12.9)
NEUTROPHILS: 62 % (ref 32–75)
NRBC: 0 PER 100 WBC
PLATELET: 320 10*3/uL (ref 150–400)
RBC: 3.84 M/uL (ref 3.80–5.20)
RDW: 15.4 % — ABNORMAL HIGH (ref 11.5–14.5)
WBC: 7.9 10*3/uL (ref 3.6–11.0)

## 2021-07-04 LAB — METABOLIC PANEL, BASIC
Anion gap: 4 mmol/L — ABNORMAL LOW (ref 5–15)
BUN/Creatinine ratio: 9 — ABNORMAL LOW (ref 12–20)
BUN: 6 MG/DL (ref 6–20)
CO2: 26 mmol/L (ref 21–32)
Calcium: 8.3 MG/DL — ABNORMAL LOW (ref 8.5–10.1)
Chloride: 106 mmol/L (ref 97–108)
Creatinine: 0.68 MG/DL (ref 0.55–1.02)
GFR est AA: >60 mL/min/{1.73_m2} (ref >60)
GFR est non-AA: >60 mL/min/{1.73_m2} (ref >60)
Glucose: 87 mg/dL (ref 65–100)
Potassium: 4.1 mmol/L (ref 3.5–5.1)
Sodium: 136 mmol/L (ref 136–145)

## 2021-07-04 MED FILL — LIDOCAINE 8 MG/ML INFUSION: 8 mg/mL (0. %) | INTRAVENOUS | Qty: 250

## 2021-07-04 MED FILL — CELECOXIB 100 MG CAP: 100 mg | ORAL | Qty: 1

## 2021-07-04 MED FILL — HYDROMORPHONE 2 MG TAB: 2 mg | ORAL | Qty: 1

## 2021-07-04 MED FILL — ACETAMINOPHEN 500 MG TAB: 500 mg | ORAL | Qty: 2

## 2021-07-04 MED FILL — ENOXAPARIN 40 MG/0.4 ML SUB-Q SYRINGE: 40 mg/0.4 mL | SUBCUTANEOUS | Qty: 0.4

## 2021-07-04 MED FILL — MOVANTIK 12.5 MG TABLET: 12.5 mg | ORAL | Qty: 2

## 2021-07-04 NOTE — Progress Notes (Signed)
General Daily Progress Note    Admission Date: 07/02/2021  Hospital Day 3  Post-Op Day 2  Subjective:   Pain control adequate.  No nausea overnight.  Voiding well.  No flatus.      Objective:   Patient Vitals for the past 24 hrs:   BP Temp Pulse Resp SpO2   07/04/21 0049 129/73 98.5 ??F (36.9 ??C) 75 18 98 %   07/03/21 1506 108/67 98.4 ??F (36.9 ??C) 77 20 98 %   07/03/21 1146 129/81 98.7 ??F (37.1 ??C) 79 20 98 %   07/03/21 0812 108/68 98.6 ??F (37 ??C) 86 18 94 %     No intake/output data recorded.  08/11 1901 - 08/13 0700  In: -   Out: 2050 [Urine:2050]      Physical Examination:  General Appearance - No distress.  Abdomen - Soft.  Appropriately tender.  Incisions clean, dry, intact, and without erythema.  Extremities - Pneumatic compression stockings in use.  No calf tenderness.            Data Review   Recent Results (from the past 24 hour(s))   CBC WITH AUTOMATED DIFF    Collection Time: 07/04/21  3:57 AM   Result Value Ref Range    WBC 7.9 3.6 - 11.0 K/uL    RBC 3.84 3.80 - 5.20 M/uL    HGB 10.6 (L) 11.5 - 16.0 g/dL    HCT 16.1 (L) 09.6 - 47.0 %    MCV 81.0 80.0 - 99.0 FL    MCH 27.6 26.0 - 34.0 PG    MCHC 34.1 30.0 - 36.5 g/dL    RDW 04.5 (H) 40.9 - 14.5 %    PLATELET 320 150 - 400 K/uL    MPV 9.7 8.9 - 12.9 FL    NRBC 0.0 0 PER 100 WBC    ABSOLUTE NRBC 0.00 0.00 - 0.01 K/uL    NEUTROPHILS 62 32 - 75 %    LYMPHOCYTES 29 12 - 49 %    MONOCYTES 7 5 - 13 %    EOSINOPHILS 2 0 - 7 %    BASOPHILS 0 0 - 1 %    IMMATURE GRANULOCYTES 0 0.0 - 0.5 %    ABS. NEUTROPHILS 4.8 1.8 - 8.0 K/UL    ABS. LYMPHOCYTES 2.3 0.8 - 3.5 K/UL    ABS. MONOCYTES 0.6 0.0 - 1.0 K/UL    ABS. EOSINOPHILS 0.2 0.0 - 0.4 K/UL    ABS. BASOPHILS 0.0 0.0 - 0.1 K/UL    ABS. IMM. GRANS. 0.0 0.00 - 0.04 K/UL    DF AUTOMATED     METABOLIC PANEL, BASIC    Collection Time: 07/04/21  3:57 AM   Result Value Ref Range    Sodium 136 136 - 145 mmol/L    Potassium 4.1 3.5 - 5.1 mmol/L    Chloride 106 97 - 108 mmol/L    CO2 26 21 - 32 mmol/L    Anion gap 4 (L) 5 - 15  mmol/L    Glucose 87 65 - 100 mg/dL    BUN 6 6 - 20 MG/DL    Creatinine 8.11 9.14 - 1.02 MG/DL    BUN/Creatinine ratio 9 (L) 12 - 20      GFR est AA >60 >60 ml/min/1.49m2    GFR est non-AA >60 >60 ml/min/1.25m2    Calcium 8.3 (L) 8.5 - 10.1 MG/DL           Assessment:     Principal  Problem:    Crohn's disease with complication (HCC) (12/14/2018)    Active Problems:    Crohn's disease with intestinal obstruction (HCC) (07/02/2021)        2 Days Post-Op s/p hand-assisted laparoscopic ileocecal resection and ileocolostomy.     Hemodynamically stable.  Hemoglobin stable.  Urine output adequate.  Creatinine within normal limits.    Awaiting return of GI function.       Plan:   Begin low fiber diet.  Continue Ensure Surgery.  Ambulate.  Physical therapy.  Incentive spirometer.

## 2021-07-05 LAB — BASIC METABOLIC PANEL
Anion Gap: 5 mmol/L (ref 5–15)
BUN: 8 MG/DL (ref 6–20)
Bun/Cre Ratio: 12 (ref 12–20)
CO2: 25 mmol/L (ref 21–32)
Calcium: 8.8 MG/DL (ref 8.5–10.1)
Chloride: 108 mmol/L (ref 97–108)
Creatinine: 0.65 MG/DL (ref 0.55–1.02)
EGFR IF NonAfrican American: >60 mL/min/{1.73_m2} (ref >60)
GFR African American: >60 mL/min/{1.73_m2} (ref >60)
Glucose: 94 mg/dL (ref 65–100)
Potassium: 4.1 mmol/L (ref 3.5–5.1)
Sodium: 138 mmol/L (ref 136–145)

## 2021-07-05 LAB — CBC WITH AUTO DIFFERENTIAL
Basophils %: 0 % (ref 0–1)
Basophils Absolute: 0 10*3/uL (ref 0.0–0.1)
Eosinophils %: 4 % (ref 0–7)
Eosinophils Absolute: 0.3 10*3/uL (ref 0.0–0.4)
Granulocyte Absolute Count: 0 10*3/uL (ref 0.00–0.04)
Hematocrit: 32.5 % — ABNORMAL LOW (ref 35.0–47.0)
Hemoglobin: 11 g/dL — ABNORMAL LOW (ref 11.5–16.0)
Immature Granulocytes: 0 % (ref 0.0–0.5)
Lymphocytes %: 26 % (ref 12–49)
Lymphocytes Absolute: 1.9 10*3/uL (ref 0.8–3.5)
MCH: 27.7 PG (ref 26.0–34.0)
MCHC: 33.8 g/dL (ref 30.0–36.5)
MCV: 81.9 FL (ref 80.0–99.0)
MPV: 9.1 FL (ref 8.9–12.9)
Monocytes %: 7 % (ref 5–13)
Monocytes Absolute: 0.5 10*3/uL (ref 0.0–1.0)
NRBC Absolute: 0 10*3/uL (ref 0.00–0.01)
Neutrophils %: 63 % (ref 32–75)
Neutrophils Absolute: 4.5 10*3/uL (ref 1.8–8.0)
Nucleated RBCs: 0 PER 100 WBC
Platelets: 333 10*3/uL (ref 150–400)
RBC: 3.97 M/uL (ref 3.80–5.20)
RDW: 15.2 % — ABNORMAL HIGH (ref 11.5–14.5)
WBC: 7.3 10*3/uL (ref 3.6–11.0)

## 2021-07-05 LAB — PHOSPHORUS
Phosphorus: 3.3 MG/DL (ref 2.6–4.7)
Phosphorus: 3.3 MG/DL (ref 2.6–4.7)

## 2021-07-05 LAB — CBC WITH AUTOMATED DIFF
ABS. BASOPHILS: 0 10*3/uL (ref 0.0–0.1)
ABS. EOSINOPHILS: 0.3 10*3/uL (ref 0.0–0.4)
ABS. IMM. GRANS.: 0 10*3/uL (ref 0.00–0.04)
ABS. LYMPHOCYTES: 1.9 10*3/uL (ref 0.8–3.5)
ABS. MONOCYTES: 0.5 10*3/uL (ref 0.0–1.0)
ABS. NEUTROPHILS: 4.5 10*3/uL (ref 1.8–8.0)
ABSOLUTE NRBC: 0 10*3/uL (ref 0.00–0.01)
BASOPHILS: 0 % (ref 0–1)
EOSINOPHILS: 4 % (ref 0–7)
HCT: 32.5 % — ABNORMAL LOW (ref 35.0–47.0)
HGB: 11 g/dL — ABNORMAL LOW (ref 11.5–16.0)
IMMATURE GRANULOCYTES: 0 % (ref 0.0–0.5)
LYMPHOCYTES: 26 % (ref 12–49)
MCH: 27.7 PG (ref 26.0–34.0)
MCHC: 33.8 g/dL (ref 30.0–36.5)
MCV: 81.9 FL (ref 80.0–99.0)
MONOCYTES: 7 % (ref 5–13)
MPV: 9.1 FL (ref 8.9–12.9)
NEUTROPHILS: 63 % (ref 32–75)
NRBC: 0 PER 100 WBC
PLATELET: 333 10*3/uL (ref 150–400)
RBC: 3.97 M/uL (ref 3.80–5.20)
RDW: 15.2 % — ABNORMAL HIGH (ref 11.5–14.5)
WBC: 7.3 10*3/uL (ref 3.6–11.0)

## 2021-07-05 LAB — METABOLIC PANEL, BASIC
Anion gap: 5 mmol/L (ref 5–15)
BUN/Creatinine ratio: 12 (ref 12–20)
BUN: 8 MG/DL (ref 6–20)
CO2: 25 mmol/L (ref 21–32)
Calcium: 8.8 MG/DL (ref 8.5–10.1)
Chloride: 108 mmol/L (ref 97–108)
Creatinine: 0.65 MG/DL (ref 0.55–1.02)
GFR est AA: >60 mL/min/{1.73_m2} (ref >60)
GFR est non-AA: >60 mL/min/{1.73_m2} (ref >60)
Glucose: 94 mg/dL (ref 65–100)
Potassium: 4.1 mmol/L (ref 3.5–5.1)
Sodium: 138 mmol/L (ref 136–145)

## 2021-07-05 MED FILL — ACETAMINOPHEN 500 MG TAB: 500 mg | ORAL | Qty: 2

## 2021-07-05 MED FILL — ENOXAPARIN 40 MG/0.4 ML SUB-Q SYRINGE: 40 mg/0.4 mL | SUBCUTANEOUS | Qty: 0.4

## 2021-07-05 MED FILL — MOVANTIK 12.5 MG TABLET: 12.5 mg | ORAL | Qty: 2

## 2021-07-05 MED FILL — CELECOXIB 100 MG CAP: 100 mg | ORAL | Qty: 1

## 2021-07-05 NOTE — Progress Notes (Signed)
General Daily Progress Note    Admission Date: 07/02/2021  Hospital Day 4  Post-Op Day 3  Subjective:   Good pain control.  No nausea.  Passing flatus and watery stool.  Still requiring some assistance to move around and has not ambulated in the hall since a short walk on 07/03/2021.      Objective:   Patient Vitals for the past 24 hrs:   BP Temp Pulse Resp SpO2   07/05/21 0059 113/69 98.7 ??F (37.1 ??C) 77 18 98 %   07/04/21 2145 110/68 98.2 ??F (36.8 ??C) 79 18 99 %   07/04/21 1620 118/71 98.4 ??F (36.9 ??C) 84 18 97 %   07/04/21 0900 115/74 98.9 ??F (37.2 ??C) 73 18 99 %     No intake/output data recorded.  No intake/output data recorded.    Physical Examination:  General Appearance - No distress.  Abdomen - Soft.  Appropriately tender.  Incisions clean, dry, intact, and without erythema.  Extremities - Pneumatic compression stockings in use.  No calf tenderness.                     Data Review   Recent Results (from the past 24 hour(s))   CBC WITH AUTOMATED DIFF    Collection Time: 07/05/21  4:07 AM   Result Value Ref Range    WBC 7.3 3.6 - 11.0 K/uL    RBC 3.97 3.80 - 5.20 M/uL    HGB 11.0 (L) 11.5 - 16.0 g/dL    HCT 54.6 (L) 27.0 - 47.0 %    MCV 81.9 80.0 - 99.0 FL    MCH 27.7 26.0 - 34.0 PG    MCHC 33.8 30.0 - 36.5 g/dL    RDW 35.0 (H) 09.3 - 14.5 %    PLATELET 333 150 - 400 K/uL    MPV 9.1 8.9 - 12.9 FL    NRBC 0.0 0 PER 100 WBC    ABSOLUTE NRBC 0.00 0.00 - 0.01 K/uL    NEUTROPHILS 63 32 - 75 %    LYMPHOCYTES 26 12 - 49 %    MONOCYTES 7 5 - 13 %    EOSINOPHILS 4 0 - 7 %    BASOPHILS 0 0 - 1 %    IMMATURE GRANULOCYTES 0 0.0 - 0.5 %    ABS. NEUTROPHILS 4.5 1.8 - 8.0 K/UL    ABS. LYMPHOCYTES 1.9 0.8 - 3.5 K/UL    ABS. MONOCYTES 0.5 0.0 - 1.0 K/UL    ABS. EOSINOPHILS 0.3 0.0 - 0.4 K/UL    ABS. BASOPHILS 0.0 0.0 - 0.1 K/UL    ABS. IMM. GRANS. 0.0 0.00 - 0.04 K/UL    DF AUTOMATED     METABOLIC PANEL, BASIC    Collection Time: 07/05/21  4:07 AM   Result Value Ref Range    Sodium 138 136 - 145 mmol/L    Potassium 4.1 3.5 -  5.1 mmol/L    Chloride 108 97 - 108 mmol/L    CO2 25 21 - 32 mmol/L    Anion gap 5 5 - 15 mmol/L    Glucose 94 65 - 100 mg/dL    BUN 8 6 - 20 MG/DL    Creatinine 8.18 2.99 - 1.02 MG/DL    BUN/Creatinine ratio 12 12 - 20      GFR est AA >60 >60 ml/min/1.42m2    GFR est non-AA >60 >60 ml/min/1.57m2    Calcium 8.8 8.5 - 10.1 MG/DL  PHOSPHORUS    Collection Time: 07/05/21  4:07 AM   Result Value Ref Range    Phosphorus 3.3 2.6 - 4.7 MG/DL           Assessment:     Principal Problem:    Crohn's disease with complication (HCC) (12/14/2018)    Active Problems:    Crohn's disease with intestinal obstruction (HCC) (07/02/2021)        3 Days Post-Op s/p hand-assisted laparoscopic ileocecal resection and ileocolostomy.     Hemodynamically stable.  Hemoglobin stable.  Creatinine within normal limits.    GI function is beginning to return.    Patient needs to ambulate more and get a bit stronger before she will be fit for discharge.        Plan:   Continue low fiber diet.  Continue Ensure Surgery.  Ambulate.  Physical therapy.  Incentive spirometer.    Probable discharge tomorrow.

## 2021-07-06 LAB — CBC
Hematocrit: 34.4 % — ABNORMAL LOW (ref 35.0–47.0)
Hemoglobin: 11.7 g/dL (ref 11.5–16.0)
MCH: 27.3 PG (ref 26.0–34.0)
MCHC: 34 g/dL (ref 30.0–36.5)
MCV: 80.2 FL (ref 80.0–99.0)
MPV: 8.9 FL (ref 8.9–12.9)
NRBC Absolute: 0 10*3/uL (ref 0.00–0.01)
Nucleated RBCs: 0 PER 100 WBC
Platelets: 358 10*3/uL (ref 150–400)
RBC: 4.29 M/uL (ref 3.80–5.20)
RDW: 15.1 % — ABNORMAL HIGH (ref 11.5–14.5)
WBC: 8.8 10*3/uL (ref 3.6–11.0)

## 2021-07-06 LAB — CBC W/O DIFF
ABSOLUTE NRBC: 0 10*3/uL (ref 0.00–0.01)
HCT: 34.4 % — ABNORMAL LOW (ref 35.0–47.0)
HGB: 11.7 g/dL (ref 11.5–16.0)
MCH: 27.3 PG (ref 26.0–34.0)
MCHC: 34 g/dL (ref 30.0–36.5)
MCV: 80.2 FL (ref 80.0–99.0)
MPV: 8.9 FL (ref 8.9–12.9)
NRBC: 0 PER 100 WBC
PLATELET: 358 10*3/uL (ref 150–400)
RBC: 4.29 M/uL (ref 3.80–5.20)
RDW: 15.1 % — ABNORMAL HIGH (ref 11.5–14.5)
WBC: 8.8 10*3/uL (ref 3.6–11.0)

## 2021-07-06 MED ORDER — ACETAMINOPHEN 500 MG TAB
500 mg | ORAL_TABLET | Freq: Four times a day (QID) | ORAL | 0 refills | Status: AC | PRN
Start: 2021-07-06 — End: ?

## 2021-07-06 MED ORDER — HYDROMORPHONE 2 MG TAB
2 mg | ORAL_TABLET | ORAL | 0 refills | Status: AC | PRN
Start: 2021-07-06 — End: 2021-07-11

## 2021-07-06 MED FILL — ACETAMINOPHEN 500 MG TAB: 500 mg | ORAL | Qty: 2

## 2021-07-06 MED FILL — CELECOXIB 100 MG CAP: 100 mg | ORAL | Qty: 1

## 2021-07-06 MED FILL — MOVANTIK 12.5 MG TABLET: 12.5 mg | ORAL | Qty: 2

## 2021-07-06 NOTE — Discharge Summary (Signed)
Discharge Summary     Patient ID:    JAZMA PICKEL  [id number]  35 y.o.  19-Jun-1986    Admission Date: 07/02/2021    Discharge Date: 07/06/2021    Admission Diagnosis:  Crohn's disease with stricture    Chronic Diagnoses:    Patient Active Problem List   Diagnosis Code    Allergic rhinitis J30.9    Asthma J45.909    Lymphadenopathy R59.1    Depression F32.A    Insomnia G47.00    Pap smear, as part of routine gynecological examination Z01.419    Crohn's disease with complication (HCC) K50.919    Abdominal pain R10.9    Crohn's disease with intestinal obstruction (HCC) K50.912       Discharge Diagnosis:   Crohn's disease with stricture    Hospital Problems as of 07/06/2021 Date Reviewed: 2021-02-06            Codes Class Noted - Resolved POA    Crohn's disease with intestinal obstruction (HCC) ICD-10-CM: C12.751  ICD-9-CM: 555.9, 560.9  07/02/2021 - Present Unknown        * (Principal) Crohn's disease with complication (HCC) (Chronic) ICD-10-CM: K50.919  ICD-9-CM: 555.9  12/14/2018 - Present Yes           Procedures Performed:  Cystoscopy and placement of bilateral ureteral stents was performed by Marshell Levan, III, MD on 07/02/2021.  Hand-assisted laparoscopic ileocecal resection and ileocolostomy was performed by Dr. Nanda Quinton on 07/02/2021.      Discharge Medications:   Current Discharge Medication List        START taking these medications    Details   HYDROmorphone (DILAUDID) 2 mg tablet Take 1 Tablet by mouth every four (4) hours as needed for Pain (acute post-operative pain) for up to 5 days. Max Daily Amount: 12 mg.  Qty: 15 Tablet, Refills: 0    Associated Diagnoses: Acute post-operative pain           CONTINUE these medications which have CHANGED    Details   acetaminophen (TYLENOL) 500 mg tablet Take 2 Tablets by mouth every six (6) hours as needed for Pain.  Qty: 100 Tablet, Refills: 0           CONTINUE these medications which have NOT CHANGED    Details   hydrOXYzine pamoate (VISTARIL) 25 mg  capsule Take 1 Capsule by mouth three (3) times daily as needed for Anxiety.  Qty: 60 Capsule, Refills: 5    Associated Diagnoses: Anxiety      adalimumab (Humira) 40 mg/0.8 mL injection by SubCUTAneous route once.           STOP taking these medications       traMADoL (ULTRAM) 50 mg tablet Comments:   Reason for Stopping:                Diet:  Low fiber diet for two weeks.    Activity:  No strenuous activity or driving until two weeks after the operation.  No lifting more than 10 lb. until four weeks after the operation, and no lifting more than 20 lb. until 8 weeks after the operation. Frequent walking and stair climbing are encouraged.    Wound Care:  None.    Discharge Condition:  Improved compared to that upon admission.    Disposition:  Home.    Follow-up Care:  1. Dr. Nanda Quinton at 9:00 AM on Monday 07/13/2021  2. Lobb, Keefe H, DO in 1-2 weeks  3.  Dr. Steva Ready  within one month      Significant Diagnostic Studies:   Recent Labs     07/06/21  0337 07/05/21  0407   WBC 8.8 7.3   HGB 11.7 11.0*   HCT 34.4* 32.5*   PLT 358 333     Recent Labs     07/05/21  0407 07/04/21  0357   NA 138 136   K 4.1 4.1   CL 108 106   CO2 25 26   BUN 8 6   CREA 0.65 0.68   GLU 94 87   CA 8.8 8.3*   PHOS 3.3  --          HOSPITAL COURSE & TREATMENT RENDERED:    The patient is a 35 year old female who was diagnosed with Crohn's disease in November 2019.  She was treated with Humira, which she receives every 2 weeks.  Her most recent dose was administered on 05/31/2021.  She has been hospitalized multiple times, including twice within the last year for Crohn's disease exacerbations.  She continues to experience abdominal pain, which is usually mostly on the right and sometimes also on the left.  Since the diagnosis of Crohn's disease was made, she has undergone multiple imaging procedures.  CT scans performed in 2020 and 2021 revealed active inflammation and apparent stricture formation in the terminal ileum and cecum.  The most recent  study was a CT enterography performed on 06/17/2021.  That study was interpreted as revealing inflammation involving the terminal ileum and the base of the cecum as well as increased involvement of the appendix (compared to 07/26/2020).  There was no other apparent small intestinal colonic Crohn's disease activity.  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  The risks of the operation were discussed in detail, and the patient agreed to proceed.    She was taken to the operating room on 07/02/2021, and there the above-listed procedures were performed without apparent complications. Post-operatively, she was transported in stable condition from the recovery room to the floor.    Her hospital course was essentially unremarkable. She experienced a gradual return of bowel function, and her diet was advanced appropriately. On POD#4, she was hemodynamically stable and afebrile, tolerating a low fiber diet, passing flatus and stool, and ambulating well.  There was no physical or laboratory evidence of infection or other complication, and at this point she was judged to be fit for discharge.    Her condition upon discharge was improved compared to that upon admission, and she appeared to have understood all discharge and follow-up instructions.        Signed:  Theresia Majors, MD

## 2021-07-06 NOTE — Progress Notes (Signed)
I have reviewed discharge instructions with the patient. The patient verbalized understanding. Post-op education was provided to the patient. Patient was given opportunity to ask questions. RN addressed patient's questions and concerns. IV was removed and patient was discharged home with family. Discharge medications reviewed with patient and appropriate educational materials and side effects teaching were provided.    Current Discharge Medication List        START taking these medications    Details   HYDROmorphone (DILAUDID) 2 mg tablet Take 1 Tablet by mouth every four (4) hours as needed for Pain (acute post-operative pain) for up to 5 days. Max Daily Amount: 12 mg.  Qty: 15 Tablet, Refills: 0  Start date: 07/06/2021, End date: 07/11/2021    Associated Diagnoses: Acute post-operative pain           CONTINUE these medications which have CHANGED    Details   acetaminophen (TYLENOL) 500 mg tablet Take 2 Tablets by mouth every six (6) hours as needed for Pain.  Qty: 100 Tablet, Refills: 0  Start date: 07/06/2021           CONTINUE these medications which have NOT CHANGED    Details   hydrOXYzine pamoate (VISTARIL) 25 mg capsule Take 1 Capsule by mouth three (3) times daily as needed for Anxiety.  Qty: 60 Capsule, Refills: 5    Associated Diagnoses: Anxiety      adalimumab (Humira) 40 mg/0.8 mL injection by SubCUTAneous route once.           STOP taking these medications       traMADoL (ULTRAM) 50 mg tablet Comments:   Reason for Stopping:

## 2021-07-06 NOTE — Progress Notes (Signed)
General Daily Progress Note    Admission Date: 07/02/2021  Hospital Day 5  Post-Op Day 4  Subjective:   Good pain control.  Ambulating better.  Tolerating diet.  Passing flatus and stool.      Objective:   Patient Vitals for the past 24 hrs:   BP Temp Pulse Resp SpO2   07/06/21 0808 107/71 98.5 ??F (36.9 ??C) 72 18 98 %   07/05/21 2256 115/66 97.9 ??F (36.6 ??C) 66 18 98 %   07/05/21 2110 103/67 97.8 ??F (36.6 ??C) 71 18 98 %   07/05/21 1535 114/74 98.1 ??F (36.7 ??C) 81 18 97 %     No intake/output data recorded.  08/13 1901 - 08/15 0700  In: 1050 [P.O.:1050]  Out: -     Physical Examination:  General Appearance - No distress.  Abdomen - Soft.  Appropriately tender.  Incisions clean, dry, intact, and without erythema.  Extremities - Pneumatic compression stockings in use.             Data Review   Recent Results (from the past 24 hour(s))   CBC W/O DIFF    Collection Time: 07/06/21  3:37 AM   Result Value Ref Range    WBC 8.8 3.6 - 11.0 K/uL    RBC 4.29 3.80 - 5.20 M/uL    HGB 11.7 11.5 - 16.0 g/dL    HCT 42.3 (L) 53.6 - 47.0 %    MCV 80.2 80.0 - 99.0 FL    MCH 27.3 26.0 - 34.0 PG    MCHC 34.0 30.0 - 36.5 g/dL    RDW 14.4 (H) 31.5 - 14.5 %    PLATELET 358 150 - 400 K/uL    MPV 8.9 8.9 - 12.9 FL    NRBC 0.0 0 PER 100 WBC    ABSOLUTE NRBC 0.00 0.00 - 0.01 K/uL           Assessment:     Principal Problem:    Crohn's disease with complication (HCC) (12/14/2018)    Active Problems:    Crohn's disease with intestinal obstruction (HCC) (07/02/2021)        4 Days Post-Op s/p hand-assisted laparoscopic ileocecal resection and ileocolostomy.     Hemodynamically stable.  Hemoglobin stable.     GI function is returning.     The patient appears to be fit for discharge.        Plan:   Discharge to home.  Follow up with me in 1 to 2 weeks.

## 2021-07-06 NOTE — Progress Notes (Signed)
Spiritual Care Assessment/Progress Note  [facility]      NAME: Denise Huber      MRN: [id number]  AGE: 35 y.o. SEX: female  Religious Affiliation: Catholic   Language: English     07/06/2021     Total Time (in minutes): 5     Spiritual Assessment begun in [facility] 5E2 SURGICAL UNIT through conversation with:         [x] Patient        []  Family    []  Friend(s)        Reason for Consult:     Spiritual beliefs: (Please include comment if needed)     [x]  Identifies with a faith tradition:  Catholic       [x]  Supported by a faith community:            []  Claims no spiritual orientation:           []  Seeking spiritual identity:                []  Adheres to an individual form of spirituality:           []  Not able to assess:                           Identified resources for coping:      [x]  Prayer                               [x]  Music                  []  Guided Imagery     [x]  Family/friends                 []  Pet visits     []  Devotional reading                         []  Unknown     []  Other:                                               Interventions offered during this visit: (See comments for more details)    Patient Interventions: Affirmation of faith, Communion ), Initial/Spiritual assessment, patient floor, Prayer (actual), Prayer (assurance of)           Plan of Care:     [x]  Support spiritual and/or cultural needs    []  Support AMD and/or advance care planning process      []  Support grieving process   []  Coordinate Rites and/or Rituals    []  Coordination with community clergy   []  No spiritual needs identified at this time   []  Detailed Plan of Care below (See Comments)  []  Make referral to Music Therapy  []  Make referral to Pet Therapy     []  Make referral to Addiction services  []  Make referral to [facility] Passages  []  Make referral to Spiritual Care Partner  []  No future visits requested        []  Contact Spiritual Care for further referrals     Comments: Denise Huber was  dressed and answered her door. She is getting ready to go home.  She declined communion today.  Assured of prayers  for her continued recovery.    Chaplain Sr. Verdene Lennert, SBS, RN, ACSW, LCSW  Chaplain Page:  [phone])

## 2021-07-07 NOTE — Progress Notes (Signed)
Care Transitions Outreach Attempt    Call within 2 business days of discharge: Yes   Attempted to reach patient for transitions of care follow up. Unable to reach patient. LVMM and sent unable to reach letter via MyChart.  Will close episode at this time.    Patient: Denise Huber Patient DOB: [birth year] MRN: [id number]    Last Discharge [facility] Facility       Date Complaint Diagnosis Description Type Department Provider    07/02/21  Acute post-operative pain Admission (Discharged) [id number] Theresia Majors, MD          Was this an external facility discharge? No Discharge Facility: n/a    Noted following upcoming appointments from discharge chart review:   [facility] follow up appointment(s): No future appointments.  Non-[facility] follow up appointment(s): none listed

## 2021-07-28 NOTE — Progress Notes (Signed)
[employer] request was put on DR Lobb's desk to process.

## 2021-08-19 ENCOUNTER — Ambulatory Visit: Attending: Family Medicine | Primary: Family Medicine

## 2021-08-19 ENCOUNTER — Ambulatory Visit
Admit: 2021-08-19 | Discharge: 2021-08-19 | Payer: PRIVATE HEALTH INSURANCE | Attending: Family Medicine | Primary: Family Medicine

## 2021-08-19 DIAGNOSIS — F331 Major depressive disorder, recurrent, moderate: Secondary | ICD-10-CM

## 2021-08-19 MED ORDER — SERTRALINE 50 MG TAB
50 mg | ORAL_TABLET | Freq: Every day | ORAL | 0 refills | Status: AC
Start: 2021-08-19 — End: 2021-10-02

## 2021-08-19 MED ORDER — HYDROXYZINE PAMOATE 25 MG CAP
25 mg | ORAL_CAPSULE | Freq: Three times a day (TID) | ORAL | 5 refills | Status: DC | PRN
Start: 2021-08-19 — End: 2022-01-15

## 2021-08-19 MED ORDER — CYCLOBENZAPRINE 5 MG TAB
5 mg | ORAL_TABLET | ORAL | 1 refills | Status: DC
Start: 2021-08-19 — End: 2022-03-23

## 2021-08-19 NOTE — Progress Notes (Signed)
Chief Complaint   Patient presents with    Hospital Follow Up     Patient presents in office today for TOC follow up from [facility].  Admitted on 07/02/21 for Crohn's disease.  Discharged on 07/06/21.  Has c/o pain and swelling in her back.  Treating with Tylenol with no relief.  No other concerns.    1. Have you been to the ER, urgent care clinic since your last visit?  Hospitalized since your last visit?Yes When: 07/02/21 Where: [facility] Reason for visit: Crohn's disease    2. Have you seen or consulted any other health care providers outside of the [facility] since your last visit?  Include any pap smears or colon screening. No      Learning Assessment 12/06/2014   PRIMARY LEARNER Patient   HIGHEST LEVEL OF EDUCATION - PRIMARY LEARNER  GRADUATED HIGH SCHOOL OR GED   BARRIERS PRIMARY LEARNER NONE   CO-LEARNER CAREGIVER No   PRIMARY LANGUAGE ENGLISH   LEARNER PREFERENCE PRIMARY READING   ANSWERED BY Patient   RELATIONSHIP SELF

## 2021-08-19 NOTE — Progress Notes (Signed)
Progress Note    she is a 35 y.o. year old female who presents for evalution.    Subjective:     Pt here for follow up from hospitalization with sx for her crohn's last month.  Has continued pain at surgical site and this seems to be causing her back pain./  Having a hard time sleeping at night.  Pos depression screening.  Took Remus Loffler previously and felt like it did not help.  She did just restart the humira this past weekend.  She would like something to help with her back pain.  Reviewed PmHx, RxHx, FmHx, SocHx, AllgHx and updated and dated in the chart.    Review of Systems - negative except as listed above in the HPI    Objective:     Vitals:    08/19/21 1600   BP: 120/81   Pulse: 90   Resp: 18   Temp: 98.7 ??F (37.1 ??C)   TempSrc: Oral   SpO2: 96%   Weight: 221 lb (100.2 kg)   Height: 5\' 10"  (1.778 m)       Current Outpatient Medications   Medication Sig    hydrOXYzine pamoate (VISTARIL) 25 mg capsule Take 1 Capsule by mouth three (3) times daily as needed for Anxiety.    sertraline (ZOLOFT) 50 mg tablet Take 1 Tablet by mouth daily.    cyclobenzaprine (FLEXERIL) 5 mg tablet 1-2 tabs PO QHS prn back spasm    acetaminophen (TYLENOL) 500 mg tablet Take 2 Tablets by mouth every six (6) hours as needed for Pain.    adalimumab (Humira) 40 mg/0.8 mL injection by SubCUTAneous route once.     No current facility-administered medications for this visit.       Physical Examination: General appearance - alert, well appearing, and in no distress  Chest - clear to auscultation, no wheezes, rales or rhonchi, symmetric air entry  Heart - normal rate, regular rhythm, normal S1, S2, no murmurs, rubs, clicks or gallops      Assessment/ Plan:   Diagnoses and all orders for this visit:    1. Moderate episode of recurrent major depressive disorder (HCC)  -     sertraline (ZOLOFT) 50 mg tablet; Take 1 Tablet by mouth daily.    2. Anxiety  -     hydrOXYzine pamoate (VISTARIL) 25 mg capsule; Take 1 Capsule by mouth three (3)  times daily as needed for Anxiety.  -     sertraline (ZOLOFT) 50 mg tablet; Take 1 Tablet by mouth daily.    3. Crohn's disease with complication, unspecified gastrointestinal tract location [facility])   Just started humira, working with GI.      4. Back spasm  Flexeril 1-2 tabs PO HS prn spasm  Follow-up and Dispositions    Return in about 6 weeks (around 09/30/2021), or if symptoms worsen or fail to improve.         I have discussed the diagnosis with the patient and the intended plan as seen in the above orders.  The patient has received an after-visit summary and questions were answered concerning future plans. Pt conveyed understanding of plan.    Medication Side Effects and Warnings were discussed with patient    An electronic signature was used to authenticate this note  13/07/2021, DO

## 2021-09-26 ENCOUNTER — Inpatient Hospital Stay
Admit: 2021-09-26 | Discharge: 2021-09-26 | Disposition: A | Discharge status: Home / Self Care | Payer: PRIVATE HEALTH INSURANCE | Attending: Emergency Medicine

## 2021-09-26 DIAGNOSIS — K0889 Other specified disorders of teeth and supporting structures: Secondary | ICD-10-CM

## 2021-09-26 MED ORDER — DIPHENHYDRAMINE 12.5 MG/5 ML ELIXIR
12.55 mg/5 mL | Freq: Once | ORAL | Status: AC
Start: 2021-09-26 — End: 2021-09-26
  Administered 2021-09-26: 12:00:00

## 2021-09-26 MED ORDER — PENICILLIN V-K 250 MG TAB
250 mg | ORAL | Status: AC
Start: 2021-09-26 — End: 2021-09-26
  Administered 2021-09-26: 12:00:00 via ORAL

## 2021-09-26 MED ORDER — PENICILLIN V-K 250 MG TAB
250 mg | ORAL | Status: AC
Start: 2021-09-26 — End: 2021-09-26

## 2021-09-26 MED ORDER — LIDOCAINE 2 % MUCOSAL SOLN
2 % | Status: AC
Start: 2021-09-26 — End: 2021-09-26

## 2021-09-26 MED ORDER — DIPHENHYDRAMINE 12.5 MG/5 ML ELIXIR
12.5 mg/5 mL | ORAL | Status: AC
Start: 2021-09-26 — End: 2021-09-26

## 2021-09-26 MED ORDER — PENICILLIN V-K 500 MG TAB
500 mg | ORAL_TABLET | Freq: Four times a day (QID) | ORAL | 0 refills | Status: DC
Start: 2021-09-26 — End: 2021-10-02

## 2021-09-26 MED ORDER — OXYCODONE-ACETAMINOPHEN 5 MG-325 MG TAB
5-325 mg | ORAL_TABLET | Freq: Four times a day (QID) | ORAL | 0 refills | Status: AC | PRN
Start: 2021-09-26 — End: 2021-09-28

## 2021-09-26 MED ORDER — BENZOCAINE 20 % MUCOSAL AEROSOL SPRAY
20 % | Status: AC
Start: 2021-09-26 — End: 2021-09-26

## 2021-09-26 MED FILL — DIPHENHYDRAMINE 12.5 MG/5 ML ELIXIR: 12.5 mg/5 mL | ORAL | Qty: 5

## 2021-09-26 MED FILL — LIDOCAINE VISCOUS 2 % MUCOSAL SOLUTION: 2 % | Qty: 15

## 2021-09-26 MED FILL — PENICILLIN V-K 250 MG TAB: 250 mg | ORAL | Qty: 2

## 2021-09-26 MED FILL — HURRICAINE 20 % MUCOSAL SPRAY: 20 % | Qty: 57

## 2021-09-26 NOTE — ED Notes (Signed)
Pt reports left upper dental pain x 1 week after filling fell out.  Pt with dentist appointment on Monday.
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
## 2021-09-26 NOTE — ED Notes (Signed)
Pt was discharged and given instructions by MD. Pt verbalized good understanding of all discharge instructions,2 prescriptions and F/U care. All questions answered. Pt in stable condition on discharge. Pt was advised to keep all medication she was given out of reach of her children and to use caution when doing anything dangerous or driving while taking her pain medication.

## 2021-10-02 ENCOUNTER — Ambulatory Visit
Admit: 2021-10-02 | Discharge: 2021-10-02 | Payer: PRIVATE HEALTH INSURANCE | Attending: Family Medicine | Primary: Family Medicine

## 2021-10-02 DIAGNOSIS — K0889 Other specified disorders of teeth and supporting structures: Secondary | ICD-10-CM

## 2021-10-02 MED ORDER — SERTRALINE 50 MG TAB
50 mg | ORAL_TABLET | Freq: Every day | ORAL | 1 refills | Status: DC
Start: 2021-10-02 — End: 2022-01-15

## 2021-10-02 NOTE — Progress Notes (Signed)
Progress  Notes by Dietrich Pates, DO at 10/02/21 0815                Author: Dietrich Pates, DO  Service: --  Author Type: Physician       Filed: 10/02/21 0913  Encounter Date: 10/02/2021  Status: Signed          Editor: Dietrich Pates, DO (Physician)                                Progress Note      she is a 35 y.o. year old female who presents for evalution.        Subjective:     Patient seen back in December she was positive on depression screening we had started sertraline for her.  This does seem to be helping.  She is also tolerating this.      Pt was recently seen at ED for tooth pain, filling fell out and she now needs root canal.  She was at ED and given penicillin.  She has since seen the dentist.     Reviewed PmHx, RxHx, FmHx, SocHx, AllgHx and updated and dated in the chart.      Review of Systems - negative except as listed above in the HPI        Objective:          Vitals:          10/02/21 0816        BP:  110/68     Pulse:  88     Resp:  18     Temp:  98.4 ??F (36.9 ??C)     TempSrc:  Oral     SpO2:  96%     Weight:  212 lb (96.2 kg)        Height:  5\' 10"  (1.778 m)             Current Outpatient Medications        Medication  Sig         ?  sertraline (ZOLOFT) 50 mg tablet  Take 1 Tablet by mouth daily.     ?  hydrOXYzine pamoate (VISTARIL) 25 mg capsule  Take 1 Capsule by mouth three (3) times daily as needed for Anxiety.     ?  cyclobenzaprine (FLEXERIL) 5 mg tablet  1-2 tabs PO QHS prn back spasm     ?  acetaminophen (TYLENOL) 500 mg tablet  Take 2 Tablets by mouth every six (6) hours as needed for Pain.         ?  adalimumab (Humira) 40 mg/0.8 mL injection  by SubCUTAneous route once.          No current facility-administered medications for this visit.           Physical Examination: General appearance - alert, well appearing, and in no distress   Mental status - alert, oriented to person, place, and time     Ears- clear b/l     Assessment/ Plan:     Diagnoses and all orders for this  visit:      1. Tooth pain   On clindamycin awaiting root canal   2. Anxiety   -     sertraline (ZOLOFT) 50 mg tablet; Take 1 Tablet by mouth daily.      3. Moderate episode of recurrent major depressive disorder (HCC)   -  sertraline (ZOLOFT) 50 mg tablet; Take 1 Tablet by mouth daily.       4. Ear fullness on left   Zyrtec 10mg  nightly     Follow-up and Dispositions      ??  Return in about 3 months (around 01/02/2022), or if symptoms worsen or fail to improve.                   I have discussed the diagnosis with the patient and the intended plan as seen in the above orders.  The patient has received an after-visit  summary and questions were answered concerning future plans. Pt conveyed understanding of plan.      Medication Side Effects and Warnings were discussed with patient      An electronic signature was used to authenticate this note   03/02/2022, DO

## 2021-10-02 NOTE — Progress Notes (Signed)
Chief Complaint   Patient presents with    Medication Evaluation     Patient presents in office today for med check.  States that the sertraline has been helping.  Has c/o having pressure in her left ear.  No other concerns.      1. Have you been to the ER, urgent care clinic since your last visit?  Hospitalized since your last visit?No    2. Have you seen or consulted any other health care providers outside of the [facility] since your last visit?  Include any pap smears or colon screening. No    Learning Assessment 12/06/2014   PRIMARY LEARNER Patient   HIGHEST LEVEL OF EDUCATION - PRIMARY LEARNER  GRADUATED HIGH SCHOOL OR GED   BARRIERS PRIMARY LEARNER NONE   CO-LEARNER CAREGIVER No   PRIMARY LANGUAGE ENGLISH   LEARNER PREFERENCE PRIMARY READING   ANSWERED BY Patient   RELATIONSHIP SELF

## 2022-01-01 ENCOUNTER — Ambulatory Visit: Payer: PRIVATE HEALTH INSURANCE | Attending: Family Medicine | Primary: Family Medicine

## 2022-01-15 ENCOUNTER — Ambulatory Visit: Attending: Family Medicine | Primary: Family Medicine

## 2022-01-15 ENCOUNTER — Ambulatory Visit
Admit: 2022-01-15 | Discharge: 2022-01-15 | Payer: PRIVATE HEALTH INSURANCE | Attending: Family Medicine | Primary: Family Medicine

## 2022-01-15 DIAGNOSIS — F419 Anxiety disorder, unspecified: Secondary | ICD-10-CM

## 2022-01-15 MED ORDER — HYDROXYZINE PAMOATE 25 MG CAP
25 mg | ORAL_CAPSULE | Freq: Three times a day (TID) | ORAL | 5 refills | Status: AC | PRN
Start: 2022-01-15 — End: ?

## 2022-01-15 MED ORDER — SERTRALINE 50 MG TAB
50 mg | ORAL_TABLET | Freq: Every day | ORAL | 2 refills | Status: AC
Start: 2022-01-15 — End: ?

## 2022-01-15 NOTE — Progress Notes (Signed)
 Chief Complaint   Patient presents with    Medication Check     Patient presents in office today for med check.  No concerns.    1. Have you been to the ER, urgent care clinic since your last visit?  Hospitalized since your last visit?No    2. Have you seen or consulted any other health care providers outside of the [facility] since your last visit?  Include any pap smears or colon screening. No      Learning Assessment 12/06/2014   PRIMARY LEARNER Patient   HIGHEST LEVEL OF EDUCATION - PRIMARY LEARNER  GRADUATED HIGH SCHOOL OR GED   BARRIERS PRIMARY LEARNER NONE   CO-LEARNER CAREGIVER No   PRIMARY LANGUAGE ENGLISH   LEARNER PREFERENCE PRIMARY READING   ANSWERED BY Patient   RELATIONSHIP SELF

## 2022-01-15 NOTE — Progress Notes (Signed)
Progress  Notes by Dietrich Pates, DO at 01/15/22 0945                Author: Dietrich Pates, DO  Service: --  Author Type: Physician       Filed: 01/15/22 1026  Encounter Date: 01/15/2022  Status: Signed          Editor: Dietrich Pates, DO (Physician)                                Progress Note      she is a 36 y.o. year old female who presents for evalution.        Subjective:        Pt states the zoloft is working  well but changed her building at work and has triggered her anxiety some so has been using the vistaril more often.  States the episodes have been less frequent the past few weeks and she will be moving back to her usual  building.  States more stressful in this new building.  Goes there 13 weeks a year but is broken up during the year.        Seeing GI for crohns and has been doing well, taking humira every 2 weeks.     Reviewed PmHx, RxHx, FmHx, SocHx, AllgHx and updated and dated in the chart.      Review of Systems - negative except as listed above in the HPI        Objective:          Vitals:          01/15/22 1000        BP:  109/67     Pulse:  83     Resp:  18     Temp:  98.1 ??F (36.7 ??C)     TempSrc:  Oral     SpO2:  98%     Weight:  220 lb (99.8 kg)        Height:  5\' 10"  (1.778 m)             Current Outpatient Medications        Medication  Sig         ?  hydrOXYzine pamoate (VISTARIL) 25 mg capsule  Take 1 Capsule by mouth three (3) times daily as needed for Anxiety.     ?  sertraline (ZOLOFT) 50 mg tablet  Take 1 Tablet by mouth daily.     ?  acetaminophen (TYLENOL) 500 mg tablet  Take 2 Tablets by mouth every six (6) hours as needed for Pain.     ?  adalimumab (Humira) 40 mg/0.8 mL injection  by SubCUTAneous route once.         ?  cyclobenzaprine (FLEXERIL) 5 mg tablet  1-2 tabs PO QHS prn back spasm (Patient not taking: Reported on 01/15/2022)          No current facility-administered medications for this visit.           Physical Examination: General appearance - alert, well  appearing, and in no distress   Chest - clear to auscultation, no wheezes, rales or rhonchi, symmetric air entry   Heart - normal rate, regular rhythm, normal S1, S2, no murmurs, rubs, clicks or gallops          Assessment/ Plan:     Diagnoses and all orders for this visit:  1. Anxiety   -     hydrOXYzine pamoate (VISTARIL) 25 mg capsule; Take 1 Capsule by mouth three (3) times daily as needed for Anxiety.   -     sertraline (ZOLOFT) 50 mg tablet; Take 1 Tablet by mouth daily.      2. Moderate episode of recurrent major depressive disorder (HCC)   -     sertraline (ZOLOFT) 50 mg tablet; Take 1 Tablet by mouth daily.      3. Crohn's disease with complication, unspecified gastrointestinal tract location [facility])    Following with GI on Humira.       Follow-up and Dispositions      ??  Return in about 6 months (around 07/15/2022), or if symptoms worsen or fail to improve.                   I have discussed the diagnosis with the patient and the intended plan as seen in the above orders.  The patient has received an after-visit  summary and questions were answered concerning future plans. Pt conveyed understanding of plan.      Medication Side Effects and Warnings were discussed with patient      An electronic signature was used to authenticate this note   Dietrich Pates, DO

## 2022-03-15 ENCOUNTER — Ambulatory Visit: Attending: Surgical Oncology | Primary: Family Medicine

## 2022-03-15 ENCOUNTER — Ambulatory Visit
Admit: 2022-03-15 | Discharge: 2022-03-15 | Payer: PRIVATE HEALTH INSURANCE | Attending: Surgical Oncology | Primary: Family Medicine

## 2022-03-15 DIAGNOSIS — N6324 Unspecified lump in the left breast, lower inner quadrant: Secondary | ICD-10-CM
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
## 2022-03-15 NOTE — Progress Notes (Signed)
Identified pt with two pt identifiers (name and DOB). Reviewed chart in preparation for visit and have obtained necessary documentation.    Denise Huber is a 36 y.o. female  Chief Complaint   Patient presents with    Skin Problem     Left breast abscess     New Patient     Referred by Dr. Carlye Grippe      Visit Vitals  BP 110/80 (BP 1 Location: Left upper arm, BP Patient Position: Sitting, BP Cuff Size: Large adult)   Pulse 99   Temp 98.5 F (36.9 C) (Oral)   Resp 16   Ht 5\' 10"  (1.778 m)   Wt 223 lb 6.4 oz (101.3 kg)   SpO2 99%   BMI 32.05 kg/m       1. Have you been to the ER, urgent care clinic since your last visit?  Hospitalized since your last visit?No    2. Have you seen or consulted any other health care providers outside of the [facility] since your last visit?  Include any pap smears or colon screening. No

## 2022-03-23 NOTE — Interval H&P Note (Signed)
Phone interview completed with patient.  Pre-op instructions reviewed and discussed.  Medications reviewed and instructions given on when/if to stop/take prior to surgery.  Opportunity given for patient to ask questions, and questions answered.      Patient instructed to purchase CHG soap or Hibiclens OTC and follow directions as listed; advised to use night before surgery and day of surgery.

## 2022-03-25 LAB — HCG URINE, QL. - POC
HCG, Pregnancy, Urine, POC: POSITIVE — AB
Pregnancy test,urine (POC): POSITIVE — AB

## 2022-03-25 NOTE — Progress Notes (Signed)
Once in the holding area her urine pregnancy test was positive, much to her surprise.  I thought it would be best for her to see her Obstetrician first before proceeding with her breast surgery.  Will reschedule at an appropriate time.

## 2022-03-25 NOTE — Interval H&P Note (Signed)
Patients pregnancy test positive. Surgery cancelled by Dr. Jerline Pain. Patient to schedule appointment with OB/GYN and reschedule surgery with Dr. Ellwood Handler office.

## 2022-04-07 ENCOUNTER — Ambulatory Visit: Payer: PRIVATE HEALTH INSURANCE | Attending: Surgical Oncology | Primary: Family Medicine

## 2022-04-24 ENCOUNTER — Inpatient Hospital Stay
Admit: 2022-04-24 | Discharge: 2022-04-24 | Disposition: A | Discharge status: Home / Self Care | Payer: PRIVATE HEALTH INSURANCE | Attending: Emergency Medicine

## 2022-04-24 ENCOUNTER — Emergency Department: Admit: 2022-04-24 | Payer: PRIVATE HEALTH INSURANCE | Primary: Family Medicine

## 2022-04-24 DIAGNOSIS — N939 Abnormal uterine and vaginal bleeding, unspecified: Secondary | ICD-10-CM

## 2022-04-24 DIAGNOSIS — O039 Complete or unspecified spontaneous abortion without complication: Secondary | ICD-10-CM

## 2022-04-24 LAB — BASIC METABOLIC PANEL
Anion Gap: 9 mmol/L (ref 5–15)
BUN: 7 MG/DL (ref 6–20)
Bun/Cre Ratio: 11 — ABNORMAL LOW (ref 12–20)
CO2: 25 mmol/L (ref 22–29)
Calcium: 8.6 MG/DL (ref 8.6–10.0)
Chloride: 103 mmol/L (ref 98–107)
Creatinine: 0.66 MG/DL (ref 0.50–0.90)
Est, Glom Filt Rate: >60 mL/min/{1.73_m2} (ref >60)
Glucose: 100 mg/dL (ref 65–100)
Potassium: 3.9 mmol/L (ref 3.5–5.1)
Sodium: 137 mmol/L (ref 136–145)

## 2022-04-24 LAB — CBC WITH AUTO DIFFERENTIAL
Absolute Immature Granulocyte: 0.1 10*3/uL — ABNORMAL HIGH (ref 0.00–0.04)
Basophils %: 0 % (ref 0–1)
Basophils Absolute: 0 10*3/uL (ref 0–1)
Eosinophils %: 2 % — ABNORMAL LOW
Eosinophils Absolute: 0.3 10*3/uL (ref 0.0–0.4)
Hematocrit: 29.8 % — ABNORMAL LOW (ref 35.0–47.0)
Hemoglobin: 10.1 g/dL — ABNORMAL LOW (ref 11.5–16.0)
Immature Granulocytes: 1 % — ABNORMAL HIGH (ref 0–0.5)
Lymphocytes %: 23 % (ref 12–49)
Lymphocytes Absolute: 2.8 10*3/uL (ref 0.8–3.5)
MCH: 27.7 PG (ref 26.0–34.0)
MCHC: 33.9 g/dL (ref 30.0–36.5)
MCV: 81.6 FL (ref 80.0–99.0)
MPV: 10 FL (ref 8.9–12.9)
Monocytes %: 8 % (ref 5–13)
Monocytes Absolute: 1 10*3/uL (ref 0.0–1.0)
Neutrophils %: 66 % (ref 32–75)
Neutrophils Absolute: 8.2 10*3/uL — ABNORMAL HIGH (ref 1.8–8.0)
Nucleated RBCs: 0 PER 100 WBC
Platelets: 342 10*3/uL (ref 150–400)
RBC: 3.65 M/uL — ABNORMAL LOW (ref 3.80–5.20)
RDW: 15.8 % — ABNORMAL HIGH (ref 11.5–14.5)
WBC: 12.3 10*3/uL — ABNORMAL HIGH (ref 3.6–11.0)
nRBC: 0 10*3/uL (ref 0.00–0.01)

## 2022-04-24 LAB — HCG, QUANTITATIVE, PREGNANCY: hCG Quant: 64499 m[IU]/mL — ABNORMAL HIGH (ref <7)

## 2022-04-24 LAB — TYPE AND SCREEN
ABO/Rh: B POS
Antibody Screen: NEGATIVE

## 2022-04-24 MED ORDER — SODIUM CHLORIDE 0.9 % IV BOLUS
0.9 % | Freq: Once | INTRAVENOUS | Status: AC
Start: 2022-04-24 — End: 2022-04-24
  Administered 2022-04-24: 09:00:00 1000 mL via INTRAVENOUS

## 2022-04-24 MED FILL — SODIUM CHLORIDE 0.9 % IV SOLN: 0.9 % | INTRAVENOUS | Qty: 1000

## 2022-04-24 NOTE — ED Notes (Addendum)
Pt taken to ultrasound via wheelchair.  RN Hildred Alamin with pt during ultrasound.         Sheilah Mins Uldine Fuster, RN  04/24/22 [phone]

## 2022-04-24 NOTE — ED Notes (Signed)
Patient was able to ambulate safely to the restroom and back without syncopal episode. One small clot in toilet and no clots or blood in pad.      Vernell Leep, RN  04/24/22 [phone]

## 2022-04-24 NOTE — Discharge Instructions (Addendum)
You presented to the ED with vaginal bleeding after taking medications for a chemical abortion.  You had some syncope/passing out.  Work-up here in the ED shows an ultrasound with no intrauterine pregnancy so likely you are experiencing the effects of chemical abortion.  You were observed for 5 hours and your bleeding improved.  You are able to ambulate without difficulty.  I suspect you are experiencing the expected effects of the medications.  As you are feeling better you are appropriate discharge home.  Follow-up with your OB/GYN as soon as possible.  If symptoms change or worsen bleeding worsens or you have recurrent passing out/syncope return to the ED immediately for further evaluation.

## 2022-04-24 NOTE — ED Provider Notes (Incomplete)
6:55 AM  Change of shift. Care of patient taken over from Dr. Arvin Collard; H&P reviewed, handoff complete.  Awaiting labs/imaging/consultant.    ED Course as of 04/24/22 1853   Sat Apr 24, 2022   0653 6:54 AM  Change of shift.  Care of patient signed over to Dr. Michel Harrow.  Bedside handoff complete. Awaiting [place] and reassessment.    [IO]   [id number] Patient with vaginal bleeding after taking oral abortive medication.  Having significant bleeding.  Initial vital signs are stable.  No tachycardia.  Bleeding has seemed to improve.  Ultrasound pending.  Hemoglobin 10.1, beta-hCG 64,082 [AL]   0657 Sodium: 137 [AL]   0657 Potassium: 3.9 [AL]   0657 hCG Quant(!): 64,499 [AL]   0657 WBC(!): 12.3 [AL]   0657 Hemoglobin Quant(!): 10.1 [AL]   0658 BP(!): 114/54 [AL]   0658 Pulse: 75 [AL]   0658 SpO2: 99 % [AL]   0658 MAP (mmHg): 70 [AL]   0751 Patient reassessed, bleeding continues.  Radiology reads no intrauterine pregnancy so chemical abortion likely effective for products of conception however bleeding still present.  Patient's blood pressure is trending down in spite of 2 L of IV fluid.  Patient had full syncopal episode here in the ED earlier.  We will repeat CBC and ambulate the patient. [AL]   [id number] Patient ambulated with nursing to the bathroom.  Bleeding seems to be improving.  Patient feeling better.  Will observe until 9 AM and then reassess. [AL]   2440 ABO Rh: B POSITIVE [AL]      ED Course User Index  [AL] Janeece Fitting, MD  [IO] Carolyn Stare, MD       Imaging Results:  US OB LESS THAN 14 WEEKS SINGLE OR FIRST GESTATION   Final Result   Prominent endometrial stripe. Correlation with transvaginal ultrasound will be   performed.      TRANSVAGINAL ULTRASOUND:   Realtime sonographic imaging of the pelvis was performed transvaginally. The   uterus  is normal in appearance. Uterine fibroid.   The endometrial stripe measures 40 mm.    The right ovary measures 3.2 x 2.5 cm, left ovary is not visualized. Normal   right ovarian  flow.. No free fluid.       IMPRESSION:    No intrauterine gestation.   Uterine fibroid.   Thickened endometrium is consistent with reported history of medication induced   termination.         US OB TRANSVAGINAL   Final Result   Prominent endometrial stripe. Correlation with transvaginal ultrasound will be   performed.      TRANSVAGINAL ULTRASOUND:   Realtime sonographic imaging of the pelvis was performed transvaginally. The   uterus  is normal in appearance. Uterine fibroid.   The endometrial stripe measures 40 mm.    The right ovary measures 3.2 x 2.5 cm, left ovary is not visualized. Normal   right ovarian flow.. No free fluid.       IMPRESSION:    No intrauterine gestation.   Uterine fibroid.   Thickened endometrium is consistent with reported history of medication induced   termination.           ED physician interpretation of imaging: Documented in ED course    Medications Given:  Medications   0.9 % sodium chloride bolus (0 mLs IntraVENous Stopped 04/24/22 0645)   0.9 % sodium chloride bolus (0 mLs IntraVENous Stopped 04/24/22 0645)         Medical  Decision Making  Amount and/or Complexity of Data Reviewed  Labs: ordered. Decision-making details documented in ED Course.  Radiology: ordered.    Risk  Prescription drug management.          Procedures       DISPOSITION: Decision To Discharge 04/24/2022 09:21:53 AM    CLINICAL IMPRESSION:   1. Vaginal bleeding    2. Miscarriage at 8 to [redacted] weeks gestation         Further personalized recommendations for outpatient care as below.    Key discharge instructions and summary of care provided in AVS: You presented to the ED with vaginal bleeding after taking medications for a chemical abortion.  You had some syncope/passing out.  Work-up here in the ED shows an ultrasound with no intrauterine pregnancy so likely you are experiencing the effects of chemical abortion.  You were observed for 5 hours and your bleeding improved.  You are able to ambulate without difficulty.  I  suspect you are experiencing the expected effects of the medications.  As you are feeling better you are appropriate discharge home.  Follow-up with your OB/GYN as soon as possible.  If symptoms change or worsen bleeding worsens or you have recurrent passing out/syncope return to the ED immediately for further evaluation.    The patient has been re-evaluated and feeling better. Patient is stable for discharge.  All available radiology and laboratory results have been reviewed with patient and/or available family.  Patient and/or family verbally conveyed their understanding and agreement of the patient's signs, symptoms, diagnosis, treatment and prognosis and additionally agree to follow-up as recommended in the discharge instructions or to return to the Emergency Department should their condition change or worsen prior to their follow-up appointment.  All questions have been answered and patient and/or available family express understanding.      Prescriptions provided to the patient:     Discharge Medication List as of 04/24/2022  9:23 AM           Janeece Fitting, MD   Emergency Medicine Attending Physician n

## 2022-04-24 NOTE — ED Notes (Signed)
Discharge instructions reviewed. All questions answered. Patient stable and ambulatory upon discharge.      Vernell Leep, RN  04/24/22 [phone]

## 2022-04-24 NOTE — ED Notes (Signed)
Report given to [facility], Therapist, sports. Pt resting in bed in NAD. No needs at this time. Call bell in reach. Family at bedside.      Sheilah Mins Shantal Roan, RN  04/24/22 [phone]

## 2022-04-24 NOTE — ED Notes (Signed)
Pt assisted with bedpan.     Sheilah Mins Jazmine Longshore, RN  04/24/22 [phone]
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
## 2022-04-24 NOTE — ED Notes (Signed)
While pt up to change clothing and go to the restroom, pt noted to have syncopal episode while in wheelchair. Provider at bedside.      Sheilah Mins Coralynn Gaona, RN  04/24/22 [phone]

## 2022-04-24 NOTE — ED Notes (Signed)
Pt arrives to ED bed via medics with c/o ABD pain and vaginal bleeding. She states the vaginal bleeding has increased since taking abortion pill @ 1500. She states since MN she has saturated 4 pads. She states she has ABD pain when she is passing blood clots. Pt states tonight around 0300 she had a syncopal episode. Denies hitting the head. [id number]. She is AAO x 4 in NAD. Respirations regular/unlabored. Denies additional complaints.      Rickard Patience Carlen Rebuck, RN  04/24/22 [phone]

## 2022-04-24 NOTE — ED Triage Notes (Signed)
Pt arrives via medics with c/o ABD pain and Vaginal bleeding that has increased since taking pill for abortion at 1500 today.

## 2022-07-16 ENCOUNTER — Ambulatory Visit
Admit: 2022-07-16 | Discharge: 2022-07-16 | Payer: PRIVATE HEALTH INSURANCE | Attending: Family Medicine | Primary: Family Medicine

## 2022-07-16 ENCOUNTER — Ambulatory Visit: Attending: Family Medicine | Primary: Family Medicine

## 2022-07-16 DIAGNOSIS — Z0000 Encounter for general adult medical examination without abnormal findings: Secondary | ICD-10-CM

## 2022-07-16 MED ORDER — QUETIAPINE FUMARATE 25 MG PO TABS
25 MG | ORAL_TABLET | Freq: Every evening | ORAL | 1 refills | Status: AC
Start: 2022-07-16 — End: ?

## 2022-07-16 MED ORDER — SERTRALINE HCL 100 MG PO TABS
100 MG | ORAL_TABLET | Freq: Every day | ORAL | 1 refills | Status: DC
Start: 2022-07-16 — End: 2023-01-31

## 2022-07-16 NOTE — Progress Notes (Signed)
Chief Complaint   Patient presents with    Annual Exam     Patient presents in office today for CPE.  She is not fasting.  Has c/o increase depression.  No other concerns.    1. "Have you been to the ER, urgent care clinic since your last visit?  Hospitalized since your last visit?" No    2. "Have you seen or consulted any other health care providers outside of the [facility] since your last visit?" No     3. For patients aged 29-75: Has the patient had a colonoscopy / FIT/ Cologuard? NA - based on age      If the patient is female:    4. For patients aged 50-74: Has the patient had a mammogram within the past 2 years? NA - based on age or sex      51. For patients aged 21-65: Has the patient had a pap smear? Yes - Care Gap present. Rooming MA/LPN to request most recent results

## 2022-07-16 NOTE — Progress Notes (Signed)
Chief Complaint   Patient presents with    Annual Exam     she is a 36 y.o. year old female who presents for evalution.  Feels like the Zoloft is not working anymore, has more bad days than good.  Did work previously for her tho.    Has had thoughts of hurting herself, no one else; no plan.  Discussed her mental health issues at length.  Strongly recommend she make an appointment with counselor.  States she has tried to but never follows through with making the actual appointment.  She will see if her mom will make the appointment for her so it is finally done.    Took abortion pill and had heavy bleeding, went to ED had bleeding and was mildly anemic, will recheck today.  Reviewed PmHx, RxHx, FmHx, SocHx, AllgHx and updated and dated in the chart.    Review of Systems - negative except as listed above in the HPI    Objective:     Vitals:    07/16/22 0839   BP: 121/80   Site: Right Upper Arm   Position: Sitting   Pulse: 85   Resp: 18   Temp: 98 F (36.7 C)   TempSrc: Oral   SpO2: 98%   Weight: 231 lb (104.8 kg)   Height: 5\' 10"  (1.778 m)     Physical Examination: General appearance - alert, well appearing, and in no distress  Mental status - alert, oriented to person, place, and time  Eyes - pupils equal and reactive, extraocular eye movements intact  Neck - supple, no significant adenopathy  Lymphatics - no palpable lymphadenopathy, no hepatosplenomegaly  Chest - clear to auscultation, no wheezes, rales or rhonchi, symmetric air entry  Heart - normal rate, regular rhythm, normal S1, S2, no murmurs, rubs, clicks or gallops  Abdomen - soft, nontender, nondistended, no masses or organomegaly  Neurological - alert, oriented, normal speech, no focal findings or movement disorder noted  Musculoskeletal - no joint tenderness, deformity or swelling  Extremities - peripheral pulses normal, no pedal edema, no clubbing or cyanosis    Assessment/ Plan:   Denise Huber was seen today for annual exam.    Diagnoses and all orders for  this visit:    Physical exam  -     Comprehensive Metabolic Panel; Future  -     CBC; Future  -     Lipid Panel; Future  -     TSH; Future  -     TSH  -     Lipid Panel  -     CBC  -     Comprehensive Metabolic Panel    Moderate episode of recurrent major depressive disorder (HCC)  -     sertraline (ZOLOFT) 100 MG tablet; Take 1 tablet by mouth daily  -     QUEtiapine (SEROQUEL) 25 MG tablet; Take 1 tablet by mouth nightly    Psychophysiological insomnia  -     QUEtiapine (SEROQUEL) 25 MG tablet; Take 1 tablet by mouth nightly    Encounter for hepatitis C screening test for low risk patient  -     Hepatitis C Antibody; Future  -     Hepatitis C Antibody    Anemia, unspecified type  -     CBC; Future  -     CBC       -Patient is in good health  -Discussed with patient cancer risk factors and screens needed  -Patient needs a  colonoscopy No  -Labs from previous visits were discussed with patient Yes  -Discussed with patient diet and exercise=yes  -Discussed with patient testicular (female)/breast self exam (female)= Yes  Return in about 6 weeks (around 08/27/2022), or if symptoms worsen or fail to improve, for Med check.      I have discussed the diagnosis with the patient and the intended plan as seen in the above orders.  The patient has received an after-visit summary and questions were answered concerning future plans.  Pt conveyed understanding.    Medication Side Effects and Warnings were discussed with patient: Yes  Patient Labs were reviewed and or requested: Yes  Patient Past Records were reviewed and or requested  Yes    Patient Instructions   Patient Education      A Healthy Lifestyle: Care Instructions  A healthy lifestyle can help you feel good, have more energy, and stay at a weight that's healthy for you. You can share a healthy lifestyle with your friends and family. And you can do it on your own.    Eat meals with your friends or family. You could try cooking together.   Plan activities with other people.  Go for a walk with a friend, try a free online fitness class, or join a sports league.     Eat a variety of healthy foods. These include fruits, vegetables, whole grains, low-fat dairy, and lean protein.   Choose healthy portions of food. You can use the Nutrition Facts label on food packages as a guide.     Eat more fruits and vegetables. You could add vegetables to sandwiches or add fruit to cereal.   Drink water when you are thirsty. Limit soda, juice, and sports drinks.     Try to exercise most days. Aim for at least 2 hours of exercise each week.   Keep moving. Work in the garden or take your dog on a walk. Use the stairs instead of the elevator.     If you use tobacco or nicotine, try to quit. Ask your doctor about programs and medicines to help you quit.   Limit alcohol. Men should have no more than 2 drinks a day. Women should have no more than 1. For some people, no alcohol is the best choice.   Follow-up care is a key part of your treatment and safety. Be sure to make and go to all appointments, and call your doctor if you are having problems. It's also a good idea to know your test results and keep a list of the medicines you take.  Where can you learn more?  Go to [employer] and enter U807 to learn more about "A Healthy Lifestyle: Care Instructions."  Current as of: October 05, 2021               Content Version: 13.7   2006-2023 Healthwise, Incorporated.   Care instructions adapted under license by [facility]. If you have questions about a medical condition or this instruction, always ask your healthcare professional. Healthwise, Incorporated disclaims any warranty or liability for your use of this information.                 Dr. Lolita Patella

## 2022-07-16 NOTE — Patient Instructions (Addendum)
Patient Education       A Healthy Lifestyle: Care Instructions  A healthy lifestyle can help you feel good, have more energy, and stay at a weight that's healthy for you. You can share a healthy lifestyle with your friends and family. And you can do it on your own.    Eat meals with your friends or family. You could try cooking together.   Plan activities with other people. Go for a walk with a friend, try a free online fitness class, or join a sports league.     Eat a variety of healthy foods. These include fruits, vegetables, whole grains, low-fat dairy, and lean protein.   Choose healthy portions of food. You can use the Nutrition Facts label on food packages as a guide.     Eat more fruits and vegetables. You could add vegetables to sandwiches or add fruit to cereal.   Drink water when you are thirsty. Limit soda, juice, and sports drinks.     Try to exercise most days. Aim for at least 2 hours of exercise each week.   Keep moving. Work in the garden or take your dog on a walk. Use the stairs instead of the elevator.     If you use tobacco or nicotine, try to quit. Ask your doctor about programs and medicines to help you quit.   Limit alcohol. Men should have no more than 2 drinks a day. Women should have no more than 1. For some people, no alcohol is the best choice.   Follow-up care is a key part of your treatment and safety. Be sure to make and go to all appointments, and call your doctor if you are having problems. It's also a good idea to know your test results and keep a list of the medicines you take.  Where can you learn more?  Go to https://www.healthwise.net/patientEd and enter U807 to learn more about "A Healthy Lifestyle: Care Instructions."  Current as of: October 05, 2021               Content Version: 13.7   2006-2023 Healthwise, Incorporated.   Care instructions adapted under license by [facility]. If you have questions about a medical condition or this instruction, always ask your  healthcare professional. Healthwise, Incorporated disclaims any warranty or liability for your use of this information.

## 2022-07-17 LAB — CBC
Hematocrit: 34.3 % (ref 34.0–46.6)
Hemoglobin: 10.8 g/dL — ABNORMAL LOW (ref 11.1–15.9)
MCH: 24.5 pg — ABNORMAL LOW (ref 26.6–33.0)
MCHC: 31.5 g/dL (ref 31.5–35.7)
MCV: 78 fL — ABNORMAL LOW (ref 79–97)
Platelets: 434 10*3/uL (ref 150–450)
RBC: 4.41 x10E6/uL (ref 3.77–5.28)
RDW: 18.1 % — ABNORMAL HIGH (ref 11.7–15.4)
WBC: 8 10*3/uL (ref 3.4–10.8)

## 2022-07-17 LAB — COMPREHENSIVE METABOLIC PANEL
ALT: 18 IU/L (ref 0–32)
AST: 20 IU/L (ref 0–40)
Albumin/Globulin Ratio: 1.5 (ref 1.2–2.2)
Albumin: 4.4 g/dL (ref 3.9–4.9)
Alkaline Phosphatase: 109 IU/L (ref 44–121)
BUN/Creatinine Ratio: 15 (ref 9–23)
BUN: 11 mg/dL (ref 6–20)
CO2: 23 mmol/L (ref 20–29)
Calcium: 9.6 mg/dL (ref 8.7–10.2)
Chloride: 102 mmol/L (ref 96–106)
Creatinine: 0.73 mg/dL (ref 0.57–1.00)
Est, Glomerular Filtration Rate: 111 mL/min/{1.73_m2} (ref >59)
Globulin, Total: 2.9 g/dL (ref 1.5–4.5)
Glucose: 69 mg/dL — ABNORMAL LOW (ref 70–99)
Potassium: 4.5 mmol/L (ref 3.5–5.2)
Sodium: 141 mmol/L (ref 134–144)
Total Bilirubin: 0.2 mg/dL (ref 0.0–1.2)
Total Protein: 7.3 g/dL (ref 6.0–8.5)

## 2022-07-17 LAB — LIPID PANEL
Cholesterol: 146 mg/dL (ref 100–199)
HDL: 42 mg/dL (ref >39)
LDL Calculated: 82 mg/dL (ref 0–99)
Triglycerides: 125 mg/dL (ref 0–149)
VLDL Cholesterol Calculated: 22 mg/dL (ref 5–40)

## 2022-07-17 LAB — CARDIOVASCULAR REPORT

## 2022-07-17 LAB — HEPATITIS C ANTIBODY: Hepatitis C Ab: NONREACTIVE

## 2022-07-17 LAB — TSH: TSH: 1.17 u[IU]/mL (ref 0.450–4.500)

## 2022-07-20 NOTE — Progress Notes (Signed)
[facility] pap report was put on Dr [employer]

## 2022-08-27 ENCOUNTER — Ambulatory Visit
Admit: 2022-08-27 | Discharge: 2022-08-27 | Payer: PRIVATE HEALTH INSURANCE | Attending: Family Medicine | Primary: Family Medicine

## 2022-08-27 DIAGNOSIS — F331 Major depressive disorder, recurrent, moderate: Secondary | ICD-10-CM

## 2022-08-27 MED ORDER — QUETIAPINE FUMARATE 50 MG PO TABS
50 MG | ORAL_TABLET | ORAL | 2 refills | Status: DC
Start: 2022-08-27 — End: 2023-01-14

## 2022-08-27 NOTE — Progress Notes (Signed)
Progress Note    she is a 36 y.o. year old female who presents for evaluation.    Subjective:     Pt is seeing a counselor and is going to be recommended to a diff counselor that can help her better.  She is taking the zoloft 100 at night and seroquel 25 at night.  Still having diff with sleeping.  She feels like the zoloft wears off by the morning so she will switch to day time.  Has a 34 year old, 70, and 34 year old at home.  NO SI/HI.    Having stomach issues and feels related to Crohns.    Reviewed PmHx, RxHx, FmHx, SocHx, AllgHx and updated and dated in the chart.    Review of Systems - negative except as listed above in the HPI    Objective:     Vitals:    08/27/22 0918   BP: 114/79   Site: Right Upper Arm   Position: Sitting   Pulse: 85   Resp: 18   Temp: 98.5 F (36.9 C)   TempSrc: Oral   SpO2: 96%   Weight: 226 lb (102.5 kg)   Height: 5\' 10"  (1.778 m)       Current Outpatient Medications   Medication Sig    QUEtiapine (SEROQUEL) 50 MG tablet 1 tab PO HS for 1 week then increase to 2 tabs nightly    sertraline (ZOLOFT) 100 MG tablet Take 1 tablet by mouth daily    acetaminophen (TYLENOL) 500 MG tablet Take 2 tablets by mouth every 6 hours as needed    adalimumab (HUMIRA) 40 MG/0.8ML injection Inject into the skin once    hydrOXYzine pamoate (VISTARIL) 25 MG capsule Take 1 capsule by mouth 3 times daily as needed    cyclobenzaprine (FLEXERIL) 5 MG tablet 1-2 tabs PO QHS prn back spasm (Patient not taking: Reported on 07/16/2022)     No current facility-administered medications for this visit.       Physical Examination: General appearance - alert, well appearing, and in no distress  Mental status - alert, oriented to person, place, and time      Assessment/ Plan:   Denise Huber was seen today for medication check.    Diagnoses and all orders for this visit:    Moderate episode of recurrent major depressive disorder (HCC)  -     QUEtiapine (SEROQUEL) 50 MG tablet; 1 tab PO HS for 1 week then increase to 2 tabs  nightly    Psychophysiological insomnia  -     QUEtiapine (SEROQUEL) 50 MG tablet; 1 tab PO HS for 1 week then increase to 2 tabs nightly    Crohn's disease of colon with complication [facility])  She will contact her GI doctor this morning.     Return in about 6 weeks (around 10/08/2022), or if symptoms worsen or fail to improve.        I have discussed the diagnosis with the patient and the intended plan as seen in the above orders.  The patient has received an after-visit summary and questions were answered concerning future plans. Pt conveyed understanding of plan.    Medication Side Effects and Warnings were discussed with patient    An electronic signature was used to authenticate this note  Christy Gentles, DO

## 2022-08-27 NOTE — Patient Instructions (Addendum)
Patient Education     A Healthy Lifestyle: Care Instructions  A healthy lifestyle can help you feel good, have more energy, and stay at a weight that's healthy for you. You can share a healthy lifestyle with your friends and family. And you can do it on your own.    Eat meals with your friends or family. You could try cooking together.   Plan activities with other people. Go for a walk with a friend, try a free online fitness class, or join a sports league.     Eat a variety of healthy foods. These include fruits, vegetables, whole grains, low-fat dairy, and lean protein.   Choose healthy portions of food. You can use the Nutrition Facts label on food packages as a guide.     Eat more fruits and vegetables. You could add vegetables to sandwiches or add fruit to cereal.   Drink water when you are thirsty. Limit soda, juice, and sports drinks.     Try to exercise most days. Aim for at least 2 hours of exercise each week.   Keep moving. Work in the garden or take your dog on a walk. Use the stairs instead of the elevator.     If you use tobacco or nicotine, try to quit. Ask your doctor about programs and medicines to help you quit.   Limit alcohol. Men should have no more than 2 drinks a day. Women should have no more than 1. For some people, no alcohol is the best choice.   Follow-up care is a key part of your treatment and safety. Be sure to make and go to all appointments, and call your doctor if you are having problems. It's also a good idea to know your test results and keep a list of the medicines you take.  Where can you learn more?  Go to https://www.healthwise.net/patientEd and enter U807 to learn more about "A Healthy Lifestyle: Care Instructions."  Current as of: October 04, 2021               Content Version: 13.8   2006-2023 Healthwise, Incorporated.   Care instructions adapted under license by [facility]. If you have questions about a medical condition or this instruction, always ask your healthcare  professional. Healthwise, Incorporated disclaims any warranty or liability for your use of this information.

## 2022-08-27 NOTE — Progress Notes (Signed)
Chief Complaint   Patient presents with    Medication Check     Patient presents in office today for med check.  Has c/o loss of appetite.  No concerns.    1. "Have you been to the ER, urgent care clinic since your last visit?  Hospitalized since your last visit?" No    2. "Have you seen or consulted any other health care providers outside of the [facility] since your last visit?" No     3. For patients aged 34-75: Has the patient had a colonoscopy / FIT/ Cologuard? NA - based on age      If the patient is female:    4. For patients aged 48-74: Has the patient had a mammogram within the past 2 years? NA - based on age or sex      68. For patients aged 21-65: Has the patient had a pap smear? NA - based on age or sex

## 2022-10-07 NOTE — Telephone Encounter (Signed)
Called pt to r/s 11.17.23 apt due to provider out of office at that time.LVM to call us back to r/s.

## 2022-10-08 ENCOUNTER — Ambulatory Visit: Payer: PRIVATE HEALTH INSURANCE | Attending: Family Medicine | Primary: Family Medicine

## 2022-10-15 ENCOUNTER — Telehealth
Admit: 2022-10-15 | Discharge: 2022-10-15 | Payer: PRIVATE HEALTH INSURANCE | Attending: Family Medicine | Primary: Family Medicine

## 2022-10-15 DIAGNOSIS — F331 Major depressive disorder, recurrent, moderate: Secondary | ICD-10-CM

## 2022-10-15 MED ORDER — SERTRALINE HCL 25 MG PO TABS
25 MG | ORAL_TABLET | Freq: Every day | ORAL | 1 refills | Status: AC
Start: 2022-10-15 — End: 2023-01-14

## 2022-10-15 MED ORDER — HYDROXYZINE PAMOATE 25 MG PO CAPS
25 MG | ORAL_CAPSULE | Freq: Three times a day (TID) | ORAL | 3 refills | Status: AC | PRN
Start: 2022-10-15 — End: ?

## 2022-10-15 NOTE — Progress Notes (Signed)
Progress Note    she is a 36 y.o. year old female who presents for evaluation.    Subjective:     Patient here for follow-up on depression.  We had increased her Seroquel to 100 mg nightly.This is helping her sleep at night but thinks may not be helping with depression.  Having more anxiety and panic attacks particularly with driving.    We discussed deconditioning for this.  She has seen her counselor discussed to contact counselor and discuss with her as well.  She is requesting a refill of hydroxyzine for the panic attacks.  We also discussed increasing Zoloft which she is agreeable to.    Reviewed PmHx, RxHx, FmHx, SocHx, AllgHx and updated and dated in the chart.    Review of Systems - negative except as listed above in the HPI    Objective:   There were no vitals filed for this visit.    Current Outpatient Medications   Medication Sig    sertraline (ZOLOFT) 25 MG tablet Take 1 tablet by mouth daily With 100mg     hydrOXYzine pamoate (VISTARIL) 25 MG capsule Take 1 capsule by mouth 3 times daily as needed for Anxiety    QUEtiapine (SEROQUEL) 50 MG tablet 1 tab PO HS for 1 week then increase to 2 tabs nightly    sertraline (ZOLOFT) 100 MG tablet Take 1 tablet by mouth daily    acetaminophen (TYLENOL) 500 MG tablet Take 2 tablets by mouth every 6 hours as needed    adalimumab (HUMIRA) 40 MG/0.8ML injection Inject into the skin once     No current facility-administered medications for this visit.       Physical Examination: General appearance - alert, well appearing, and in no distress  Mental status - alert, oriented to person, place, and time      Assessment/ Plan:   Denise Huber was seen today for follow-up.    Diagnoses and all orders for this visit:    Moderate episode of recurrent major depressive disorder (HCC)  -     sertraline (ZOLOFT) 25 MG tablet; Take 1 tablet by mouth daily With 100mg     Anxiety  -     sertraline (ZOLOFT) 25 MG tablet; Take 1 tablet by mouth daily With 100mg   -     hydrOXYzine pamoate  (VISTARIL) 25 MG capsule; Take 1 capsule by mouth 3 times daily as needed for Anxiety    Panic attacks  -     hydrOXYzine pamoate (VISTARIL) 25 MG capsule; Take 1 capsule by mouth 3 times daily as needed for Anxiety  We discussed deconditioning with her car for her panic attacks.  Getting in her car sitting there for a few minutes then the next time starting it the next time perhaps backing down her driveway so on and so forth.  She should also discussed with her counselor.     Return in about 6 weeks (around 11/26/2022), or if symptoms worsen or fail to improve, for Med check.      I have discussed the diagnosis with the patient and the intended plan as seen in the above orders.  The patient has received an after-visit summary and questions were answered concerning future plans. Pt conveyed understanding of plan.    Medication Side Effects and Warnings were discussed with patient      Denise Huber is being evaluated by a Virtual Visit (video visit) encounter to address concerns as mentioned above.  A caregiver was present when appropriate. Due  to this being a Scientist, physiological (During OEVOJ-50 public health emergency), evaluation of the following organ systems was limited: Vitals/Constitutional/EENT/Resp/CV/GI/GU/MS/Neuro/Skin/Heme-Lymph-Imm.  Pursuant to the emergency declaration under the [place], [place] waiver authority and the [employer] Act, this Virtual Visit was conducted with patient's (and/or legal guardian's) consent, to reduce the patient's risk of exposure to COVID-19 and provide necessary medical care.  The patient (and/or legal guardian) has also been advised to contact this office for worsening conditions or problems, and seek emergency medical treatment and/or call 911 if deemed necessary.    Patient identification was verified at the start of the visit: Yes    Services were provided through a video  synchronous discussion virtually to substitute for in-person clinic visit. Patient and provider were located at their individual homes.  --Christy Gentles, DO on 10/15/2022 at 8:45 AM

## 2022-10-15 NOTE — Patient Instructions (Signed)
Patient Education        A Healthy Lifestyle: Care Instructions  A healthy lifestyle can help you feel good, have more energy, and stay at a weight that's healthy for you. You can share a healthy lifestyle with your friends and family. And you can do it on your own.    Eat meals with your friends or family. You could try cooking together.   Plan activities with other people. Go for a walk with a friend, try a free online fitness class, or join a sports league.     Eat a variety of healthy foods. These include fruits, vegetables, whole grains, low-fat dairy, and lean protein.   Choose healthy portions of food. You can use the Nutrition Facts label on food packages as a guide.     Eat more fruits and vegetables. You could add vegetables to sandwiches or add fruit to cereal.   Drink water when you are thirsty. Limit soda, juice, and sports drinks.     Try to exercise most days. Aim for at least 2 hours of exercise each week.   Keep moving. Work in the garden or take your dog on a walk. Use the stairs instead of the elevator.     If you use tobacco or nicotine, try to quit. Ask your doctor about programs and medicines to help you quit.   Limit alcohol. Men should have no more than 2 drinks a day. Women should have no more than 1. For some people, no alcohol is the best choice.   Follow-up care is a key part of your treatment and safety. Be sure to make and go to all appointments, and call your doctor if you are having problems. It's also a good idea to know your test results and keep a list of the medicines you take.  Where can you learn more?  Go to https://www.healthwise.net/patientEd and enter U807 to learn more about "A Healthy Lifestyle: Care Instructions."  Current as of: October 04, 2021               Content Version: 13.8   2006-2023 [employer].   Care instructions adapted under license by [facility]. If you have questions about a medical condition or this instruction, always ask your  healthcare professional. Healthwise, Incorporated disclaims any warranty or liability for your use of this information.

## 2022-12-21 ENCOUNTER — Emergency Department: Admit: 2022-12-22 | Payer: PRIVATE HEALTH INSURANCE | Primary: Family Medicine

## 2022-12-21 ENCOUNTER — Inpatient Hospital Stay
Admit: 2022-12-21 | Discharge: 2022-12-22 | Disposition: A | Discharge status: Home / Self Care | Payer: PRIVATE HEALTH INSURANCE | Attending: Student in an Organized Health Care Education/Training Program

## 2022-12-21 DIAGNOSIS — O200 Threatened abortion: Secondary | ICD-10-CM

## 2022-12-21 LAB — CBC WITH AUTO DIFFERENTIAL
Absolute Immature Granulocyte: 0 10*3/uL (ref 0.00–0.04)
Basophils %: 0 % (ref 0–1)
Basophils Absolute: 0 10*3/uL (ref 0–1)
Eosinophils %: 3 % — ABNORMAL LOW
Eosinophils Absolute: 0.3 10*3/uL (ref 0.0–0.4)
Hematocrit: 34 % — ABNORMAL LOW (ref 35.0–47.0)
Hemoglobin: 11.6 g/dL (ref 11.5–16.0)
Immature Granulocytes: 0 % (ref 0–0.5)
Lymphocytes %: 29 % (ref 12–49)
Lymphocytes Absolute: 3.5 10*3/uL (ref 0.8–3.5)
MCH: 27.2 PG (ref 26.0–34.0)
MCHC: 34.1 g/dL (ref 30.0–36.5)
MCV: 79.6 FL — ABNORMAL LOW (ref 80.0–99.0)
MPV: 9.2 FL (ref 8.9–12.9)
Monocytes %: 7 % (ref 5–13)
Monocytes Absolute: 0.8 10*3/uL (ref 0.0–1.0)
Neutrophils %: 61 % (ref 32–75)
Neutrophils Absolute: 7.5 10*3/uL (ref 1.8–8.0)
Nucleated RBCs: 0 PER 100 WBC
Platelets: 344 10*3/uL (ref 150–400)
RBC: 4.27 M/uL (ref 3.80–5.20)
RDW: 17 % — ABNORMAL HIGH (ref 11.5–14.5)
WBC: 12.1 10*3/uL — ABNORMAL HIGH (ref 3.6–11.0)
nRBC: 0 10*3/uL (ref 0.00–0.01)

## 2022-12-21 LAB — POC PREGNANCY UR-QUAL: Preg Test, Ur: POSITIVE — AB

## 2022-12-21 LAB — URINE CULTURE HOLD SAMPLE

## 2022-12-21 NOTE — ED Notes (Signed)
Report given to night shift RN.  All questions answered in report.  Bedside report completed.
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
## 2022-12-21 NOTE — ED Notes (Signed)
Pt reports intermittent vaginal bleeding.  Pt states the bleeding started Saturday.  The bleeding is described as "a heavy period."  Unknown in blood clots are present.  Pt states positive home pregnancy test.  Pt states lower abd cramping started on Friday and continues with the bleeding.  Pt states the bleeding and cramping has gotten worse today.

## 2022-12-21 NOTE — ED Notes (Addendum)
Discharge instructions reviewed, discharge papers given and pt teaching completed. No prescription(s) discussed. Pt instructed to follow up with PCP and OBGYN regarding today's visit. Pt also instructed to report to ED if symptoms return, worsen, don't subside, and/or with onset of new symptoms.

## 2022-12-21 NOTE — ED Triage Notes (Signed)
Pt ambulatory to triage for vaginal bleeding and cramping starting Saturday . Pt had positive pregnancy test last week. LMP 10/30/22

## 2022-12-21 NOTE — ED Notes (Signed)
Pt resting in bed and awaits [place] results.

## 2022-12-22 LAB — URINALYSIS WITH MICROSCOPIC
Bilirubin Urine: NEGATIVE
Glucose, UA: NEGATIVE mg/dL
Leukocyte Esterase, Urine: NEGATIVE
Nitrite, Urine: NEGATIVE
Protein, UA: NEGATIVE mg/dL
Specific Gravity, UA: >1.03 — ABNORMAL HIGH (ref 1.003–1.030)
Urobilinogen, Urine: 0.2 EU/dL (ref 0.2–1.0)
pH, Urine: 6 (ref 5.0–8.0)

## 2022-12-22 LAB — COMPREHENSIVE METABOLIC PANEL
ALT: 14 U/L (ref 10–35)
AST: 12 U/L (ref 10–35)
Albumin/Globulin Ratio: 1.3 (ref 1.1–2.2)
Albumin: 4.4 g/dL (ref 3.5–5.2)
Alk Phosphatase: 95 U/L (ref 35–104)
Anion Gap: 11 mmol/L (ref 5–15)
BUN: 8 MG/DL (ref 6–20)
Bun/Cre Ratio: 9 — ABNORMAL LOW (ref 12–20)
CO2: 26 mmol/L (ref 22–29)
Calcium: 9.2 MG/DL (ref 8.6–10.0)
Chloride: 102 mmol/L (ref 98–107)
Creatinine: 0.88 MG/DL (ref 0.50–0.90)
Est, Glom Filt Rate: >60 mL/min/{1.73_m2} (ref >60)
Globulin: 3.5 g/dL (ref 2.0–4.0)
Glucose: 84 mg/dL (ref 65–100)
Potassium: 3.6 mmol/L (ref 3.5–5.1)
Sodium: 139 mmol/L (ref 136–145)
Total Bilirubin: 0.2 MG/DL (ref 0.2–1.0)
Total Protein: 7.9 g/dL (ref 6.4–8.3)

## 2022-12-22 LAB — HCG, QUANTITATIVE, PREGNANCY: hCG Quant: 4681 m[IU]/mL — ABNORMAL HIGH (ref <7)

## 2022-12-22 LAB — MAGNESIUM: Magnesium: 2 mg/dL (ref 1.6–2.6)

## 2022-12-22 MED ORDER — ACETAMINOPHEN 500 MG PO TABS
500 MG | ORAL | Status: AC
Start: 2022-12-22 — End: 2022-12-21
  Administered 2022-12-22: 1000 mg via ORAL

## 2022-12-22 MED FILL — ACETAMINOPHEN EXTRA STRENGTH 500 MG PO TABS: 500 MG | ORAL | Qty: 2

## 2022-12-30 ENCOUNTER — Emergency Department: Admit: 2022-12-30 | Payer: PRIVATE HEALTH INSURANCE | Primary: Family Medicine

## 2022-12-30 ENCOUNTER — Inpatient Hospital Stay
Admit: 2022-12-30 | Discharge: 2022-12-31 | Disposition: A | Discharge status: Home / Self Care | Payer: PRIVATE HEALTH INSURANCE | Attending: Emergency Medicine

## 2022-12-30 DIAGNOSIS — O039 Complete or unspecified spontaneous abortion without complication: Secondary | ICD-10-CM

## 2022-12-30 LAB — CBC WITH AUTO DIFFERENTIAL
Absolute Immature Granulocyte: 0 10*3/uL (ref 0.00–0.04)
Basophils %: 0 % (ref 0–1)
Basophils Absolute: 0 10*3/uL (ref 0–1)
Eosinophils %: 3 % — ABNORMAL LOW
Eosinophils Absolute: 0.3 10*3/uL (ref 0.0–0.4)
Hematocrit: 33.3 % — ABNORMAL LOW (ref 35.0–47.0)
Hemoglobin: 11.3 g/dL — ABNORMAL LOW (ref 11.5–16.0)
Immature Granulocytes: 0 % (ref 0–0.5)
Lymphocytes %: 24 % (ref 12–49)
Lymphocytes Absolute: 2.8 10*3/uL (ref 0.8–3.5)
MCH: 26.7 PG (ref 26.0–34.0)
MCHC: 33.9 g/dL (ref 30.0–36.5)
MCV: 78.7 FL — ABNORMAL LOW (ref 80.0–99.0)
MPV: 9.6 FL (ref 8.9–12.9)
Monocytes %: 8 % (ref 5–13)
Monocytes Absolute: 0.9 10*3/uL (ref 0.0–1.0)
Neutrophils %: 65 % (ref 32–75)
Neutrophils Absolute: 7.7 10*3/uL (ref 1.8–8.0)
Nucleated RBCs: 0 PER 100 WBC
Platelets: 396 10*3/uL (ref 150–400)
RBC: 4.23 M/uL (ref 3.80–5.20)
RDW: 16.2 % — ABNORMAL HIGH (ref 11.5–14.5)
WBC: 11.7 10*3/uL — ABNORMAL HIGH (ref 3.6–11.0)
nRBC: 0 10*3/uL (ref 0.00–0.01)

## 2022-12-30 LAB — HCG, QUANTITATIVE, PREGNANCY: hCG Quant: 4125 m[IU]/mL — ABNORMAL HIGH (ref <7)

## 2022-12-30 LAB — COMPREHENSIVE METABOLIC PANEL
ALT: 16 U/L (ref 10–35)
AST: 14 U/L (ref 10–35)
Albumin/Globulin Ratio: 1.2 (ref 1.1–2.2)
Albumin: 4.4 g/dL (ref 3.5–5.2)
Alk Phosphatase: 108 U/L — ABNORMAL HIGH (ref 35–104)
Anion Gap: 13 mmol/L (ref 5–15)
BUN: 7 MG/DL (ref 6–20)
Bun/Cre Ratio: 9 — ABNORMAL LOW (ref 12–20)
CO2: 25 mmol/L (ref 22–29)
Calcium: 9 MG/DL (ref 8.6–10.0)
Chloride: 101 mmol/L (ref 98–107)
Creatinine: 0.8 MG/DL (ref 0.50–0.90)
Est, Glom Filt Rate: >60 mL/min/{1.73_m2} (ref >60)
Globulin: 3.6 g/dL (ref 2.0–4.0)
Glucose: 102 mg/dL — ABNORMAL HIGH (ref 65–100)
Potassium: 3.6 mmol/L (ref 3.5–5.1)
Sodium: 139 mmol/L (ref 136–145)
Total Bilirubin: 0.2 MG/DL (ref 0.2–1.0)
Total Protein: 8 g/dL (ref 6.4–8.3)

## 2022-12-30 LAB — EXTRA TUBES HOLD: Specimen HOld: 2

## 2022-12-30 NOTE — ED Triage Notes (Signed)
To ED with c/o heavy vaginal bleeding, states is [redacted] weeks gestation.  Scene last in this ED on 1/30 with spotting, no pain and no clots.  Gravida 7 Para 3.
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
## 2022-12-30 NOTE — ED Notes (Signed)
Pt assisted with getting dressed and given resources for services regarding miscarriage. Pt reports feeling better, no pain, no abd cramping, denies dizziness and bleeding has lessened significantly. Pt states she has an appt with OBGYN tomorrow and will follow up with them. No other needs reported at this time.

## 2022-12-30 NOTE — ED Notes (Signed)
SBAR handoff received from [place], [place]

## 2022-12-30 NOTE — Discharge Instructions (Signed)
As discussed, your workup today is suggestive of a miscarriage. Follow up with your PCP and OBGYN for further management. Return to the ER if you experience severe or worsening symptoms.

## 2022-12-30 NOTE — ED Notes (Signed)
Discharge instructions reviewed, discharge papers given and pt teaching completed. (1) prescription(s) discussed. Pt instructed to follow up with PCP and OBGYN regarding today's visit. Pt also instructed to report to ED if symptoms return, worsen, don't subside, and/or with onset of new symptoms.

## 2022-12-30 NOTE — ED Notes (Addendum)
This RN in room with patient visible blood running down pants. Pt undressed and RN helped pt get cleaned up. While pt was stand 3 large blood clots came out while pt was standing. RN assisted patient to bed and tech went to grab provider. Provider in room assessing patient. Orders for blood work and ob ultrasound ordered

## 2022-12-31 MED ORDER — ACETAMINOPHEN 500 MG PO TABS
500 MG | ORAL_TABLET | ORAL | 0 refills | Status: DC | PRN
Start: 2022-12-31 — End: 2024-03-02

## 2022-12-31 MED ORDER — KETOROLAC TROMETHAMINE 30 MG/ML IJ SOLN
30 | Freq: Once | INTRAMUSCULAR | Status: AC
Start: 2022-12-31 — End: 2022-12-30
  Administered 2022-12-31: 01:00:00 15 mg via INTRAVENOUS

## 2022-12-31 MED FILL — KETOROLAC TROMETHAMINE 30 MG/ML IJ SOLN: 30 MG/ML | INTRAMUSCULAR | Qty: 1

## 2023-01-14 ENCOUNTER — Ambulatory Visit
Admit: 2023-01-14 | Discharge: 2023-01-14 | Payer: PRIVATE HEALTH INSURANCE | Attending: Family | Primary: Family Medicine

## 2023-01-14 DIAGNOSIS — O039 Complete or unspecified spontaneous abortion without complication: Secondary | ICD-10-CM

## 2023-01-14 MED ORDER — CARIPRAZINE HCL 1.5 MG PO CAPS
1.5 MG | ORAL_CAPSULE | Freq: Every day | ORAL | 0 refills | Status: AC
Start: 2023-01-14 — End: 2023-01-31

## 2023-01-14 NOTE — Progress Notes (Unsigned)
Denise Huber is a 37 y.o. female , id x 2(name and DOB). Reviewed record, history, and  medications.      Chief Complaint   Patient presents with    ED Follow-up     [facility]/8/[name] for f/up on miscarriage.   Needs clearance to [facility]      Forms     Has been having some abdominal cramping     Vitals:    01/14/23 1326   BP: 114/76   Pulse: 86   Temp: 98.2 F (36.8 C)   SpO2: 98%   Weight: 105.6 kg (232 lb 12.8 oz)   Height: 1.778 m (5\' 10" )       Coordination of Care Questionnaire:   1. Have you been to the ER, urgent care clinic since your last visit?  Hospitalized since your last visit?No    2. Have you seen or consulted any other health care providers outside of the [facility] since your last visit?  Include any pap smears or colon screening. No          07/16/2022     8:44 AM   PHQ-9    Little interest or pleasure in doing things 3   Feeling down, depressed, or hopeless 3   Trouble falling or staying asleep, or sleeping too much 3   Feeling tired or having little energy 3   Poor appetite or overeating 3   Feeling bad about yourself - or that you are a failure or have let yourself or your family down 3   Trouble concentrating on things, such as reading the newspaper or watching television 3   Moving or speaking so slowly that other people could have noticed. Or the opposite - being so fidgety or restless that you have been moving around a lot more than usual 3   Thoughts that you would be better off dead, or of hurting yourself in some way 1   PHQ-2 Score 6   PHQ-9 Total Score 25   If you checked off any problems, how difficult have these problems made it for you to do your work, take care of things at home, or get along with other people? 3            No data to display                Social Determinants of Health     Tobacco Use: High Risk (01/14/2023)    Patient History     Smoking Tobacco Use: Every Day     Smokeless Tobacco Use: Never     Passive Exposure: Not on file   Alcohol Use: Not At  Risk (12/30/2022)    AUDIT-C     Frequency of Alcohol Consumption: Never     Average Number of Drinks: Patient does not drink     Frequency of Binge Drinking: Never   Financial Resource Strain: Low Risk  (07/16/2022)    Overall Financial Resource Strain (CARDIA)     Difficulty of Paying Living Expenses: Not hard at all   Food Insecurity: Not on file (07/16/2022)   Transportation Needs: Unknown (07/16/2022)    PRAPARE - Therapist, art (Medical): Not on file     Lack of Transportation (Non-Medical): No   Physical Activity: Not on file   Stress: Not on file   Social Connections: Not on file   Intimate Partner Violence: Not on file   Depression: At  risk (07/16/2022)    PHQ-2     PHQ-2 Score: 25   Housing Stability: Unknown (07/16/2022)    Housing Stability Vital Sign     Unable to Pay for Housing in the Last Year: Not on file     Number of Places Lived in the Last Year: Not on file     Unstable Housing in the Last Year: No   Postpartum Depression: Not on file   Utilities: Not on file   Interpersonal Safety: Not on file

## 2023-01-14 NOTE — Progress Notes (Signed)
Progress Note    she is a 37 y.o. year old female who presents for evaluation of ED Follow-up (2/8/24[name] for f/up on miscarriage. [name] clearance to RTW  ) and Forms        Assessment/ Plan:     1. Miscarriage  With prolonged bleeding, improved on methergine  Follow up with ob-gyn as planned  No intercourse for 6 weeks  Return to work form completed and returned to patient at time of visit    2. Crohn's disease of colon with complication ([place])  Symptoms stable on humira  Colonoscopy scheduled in July  Follow up with GI as planned    3. Moderate episode of recurrent major depressive disorder (HCC)  Symptoms worsening since miscarriage  Counseling doesn't seem to be helping  Continue sertraline 100 mg daily  Discontinue quetiapine  Add vraylar, samples given for first 2 weeks  Patient advised to seek care urgently for severe symptoms or suicidal ideation intent by calling crisis line, reaching out to on-call provider, or going to the emergency room.  She agrees she will do this.  -     cariprazine hcl (VRAYLAR) 1.5 MG capsule; Take 1 capsule by mouth daily, Disp-90 capsule, R-0Normal    4. GAD (generalized anxiety disorder)  Symptoms worsening since miscarriage  Continue sertraline  Continue hydroxyzine prn  Continue counseling      Patient is not fasting for labs today.    Return in about 6 weeks (around 02/25/2023), or if symptoms worsen or fail to improve, for depression.      I have discussed the diagnosis with the patient and the intended plan as seen in the above orders.    The patient has received an after-visit summary and questions were answered concerning future plans.   Pt conveyed understanding of plan.    Medication Side Effects and Warnings were discussed with patient        Subjective:     Chief Complaint   Patient presents with    ED Follow-up     2/8/24[name] for f/up on miscarriage.   Needs clearance to RTW      Forms     HPI:    Presents for ED follow up.  Patient suffered  miscarriage 12/30/22, presented to ED with heavy vaginal bleeding and passage of clots at [redacted] weeks gestation.  [place] confirmed no viable pregnancy remained.  Saw her OB last week, was still having significant bleeding and reports US showed some retained clots, was given Rx for methergine. Bleeding now minimal but still with significant intermittent cramping.   Notes cramping/pelvic pain is aggravated by activity around the house.  She works at [employer], has been out since 12/21/22, was to be out for duration of pregnancy.  On her feet whole shift, pushing, pulling, lifting, bending, stooping.   No option for light duty or reduced hours at her job.    Reports depression symptoms have been worse since miscarriage, with frequent tearfulness, feeling overwhelmed, fatigue, loss of interest in activities. No thoughts of harming self or others. Seeing a counselor but doesn't feel it is helping much. Good compliance with sertraline (taking 100 mg daily) and quetiapine 100 mg qhs.        Reviewed PmHx, RxHx, FmHx, SocHx, AllgHx and updated and dated in the chart.    Review of Systems - negative except as listed above in the HPI    Objective:     Vitals:    01/14/23  1326   BP: 114/76   Pulse: 86   Temp: 98.2 F (36.8 C)   SpO2: 98%   Weight: 105.6 kg (232 lb 12.8 oz)   Height: 1.778 m ('5\' 10"'$ )       Current Outpatient Medications   Medication Sig    TYBLUME 0.1-20 MG-MCG CHEW Take 1 tablet by mouth daily chew and swallow    methylergonovine (METHERGINE) 0.2 MG tablet TAKE 1 TABLET BY MOUTH EVERY 6 HOURS AS DIRECTED    HUMIRA, 2 PEN, 40 MG/0.4ML PNKT     cariprazine hcl (VRAYLAR) 1.5 MG capsule Take 1 capsule by mouth daily    acetaminophen (TYLENOL) 500 MG tablet Take 2 tablets by mouth every 6-8 hours as needed for Pain    hydrOXYzine pamoate (VISTARIL) 25 MG capsule Take 1 capsule by mouth 3 times daily as needed for Anxiety    sertraline (ZOLOFT) 100 MG tablet Take 1 tablet by mouth daily    adalimumab (HUMIRA) 40 MG/0.8ML  injection Inject into the skin once    PEG 3350-KCl-NaBcb-NaCl-NaSulf (PEG-3350/ELECTROLYTES) 236 g SOLR TAKE 2 LITERS TWICE DAY AS DIRECTED (Patient not taking: Reported on 01/14/2023)     No current facility-administered medications for this visit.     Physical Examination: General appearance - alert, well appearing, and in no distress and oriented to person, place, and time  Mental status - normal mood, behavior, speech, dress, motor activity, and thought processes  Eyes - sclera anicteric  Neck - supple, no significant adenopathy, thyroid exam: thyroid is normal in size without nodules or tenderness  Chest - clear to auscultation, no wheezes, rales or rhonchi, symmetric air entry  Heart - normal rate, regular rhythm, normal S1, S2, no murmurs, rubs, clicks or gallops, normal bilateral carotid upstroke without bruits, no JVD  Abdomen - soft, nontender, nondistended, no masses or organomegaly  bowel sounds normal  Neurological - alert, oriented, normal speech, no focal findings or movement disorder noted  Extremities - no pedal edema noted, intact peripheral pulses, no edema, redness or tenderness in the calves or thighs  Skin - normal coloration and turgor, no rashes        Raynald Kemp, APRN - NP  01/14/2023

## 2023-01-31 ENCOUNTER — Ambulatory Visit
Admit: 2023-01-31 | Discharge: 2023-01-31 | Payer: PRIVATE HEALTH INSURANCE | Attending: Family | Primary: Family Medicine

## 2023-01-31 DIAGNOSIS — F331 Major depressive disorder, recurrent, moderate: Secondary | ICD-10-CM

## 2023-01-31 MED ORDER — CARIPRAZINE HCL 3 MG PO CAPS
3 MG | ORAL_CAPSULE | Freq: Every day | ORAL | 0 refills | Status: DC
Start: 2023-01-31 — End: 2023-04-08

## 2023-01-31 MED ORDER — SERTRALINE HCL 100 MG PO TABS
100 MG | ORAL_TABLET | Freq: Every day | ORAL | 1 refills | Status: DC
Start: 2023-01-31 — End: 2023-04-08
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
## 2023-01-31 NOTE — Progress Notes (Signed)
Denise Huber is a 37 y.o. female , id x 2(name and DOB). Reviewed questionnaires, and  medications.    Chief Complaint   Patient presents with    Letter for School/Work     RTW release        1. Have you been to the ER, urgent care clinic since your last visit?  Hospitalized since your last visit?No    2. Have you seen or consulted any other health care providers outside of the [facility] since your last visit?  Include any pap smears or colon screening. No

## 2023-02-03 NOTE — Progress Notes (Signed)
The [employer] short term disability //attending physicians form was put on NP Tassell's desk to process

## 2023-02-07 NOTE — Progress Notes (Signed)
The [employer] disability request was put on NP Tassell's desk to process

## 2023-02-24 ENCOUNTER — Ambulatory Visit: Payer: PRIVATE HEALTH INSURANCE | Attending: Family Medicine | Primary: Family Medicine

## 2023-02-25 ENCOUNTER — Ambulatory Visit
Admit: 2023-02-25 | Discharge: 2023-02-25 | Payer: PRIVATE HEALTH INSURANCE | Attending: Family Medicine | Primary: Family Medicine

## 2023-02-25 DIAGNOSIS — F331 Major depressive disorder, recurrent, moderate: Secondary | ICD-10-CM

## 2023-02-25 MED ORDER — VENLAFAXINE HCL ER 37.5 MG PO CP24
37.5 MG | ORAL_CAPSULE | Freq: Every day | ORAL | 0 refills | Status: DC
Start: 2023-02-25 — End: 2023-04-08

## 2023-02-25 NOTE — Progress Notes (Signed)
Chief Complaint   Patient presents with    Medication Check       "Have you been to the ER, urgent care clinic since your last visit?  Hospitalized since your last visit?"    YES - When: approximately 2 months ago.  Where and Why: Miscarriage.    "Have you seen or consulted any other health care providers outside of [facility] since your last visit?"    NO            Click Here for Release of Records Request

## 2023-02-25 NOTE — Progress Notes (Addendum)
Progress Note    she is a 37 y.o. year old female who presents for evaluation.    Subjective:     Pt states she feels no different with the vraylar but also feels like the zoloft never really helped much.  She has low energy levels as well.  Had a miscarriage which we discussed she will follow up with ob to see about results from fetal testing.  No SIHI.      R shoulder pain anterior last 3 weeks no known trauma.  No fall, no radiation.    Reviewed PmHx, RxHx, FmHx, SocHx, AllgHx and updated and dated in the chart.    Review of Systems - negative except as listed above in the HPI    Objective:     Vitals:    02/25/23 0930   BP: 112/73   Site: Left Upper Arm   Position: Sitting   Cuff Size: Large Adult   Pulse: 83   Resp: 18   Temp: 98.1 F (36.7 C)   TempSrc: Oral   SpO2: 98%   Weight: 107 kg (236 lb)   Height: 1.778 m (5\' 10" )       Current Outpatient Medications   Medication Sig    venlafaxine (EFFEXOR XR) 37.5 MG extended release capsule Take 1 capsule by mouth daily    cariprazine hcl (VRAYLAR) 3 MG CAPS capsule Take 1 capsule by mouth daily Discontinue 1.5 mg dose.    sertraline (ZOLOFT) 100 MG tablet Take 1 tablet by mouth daily    TYBLUME 0.1-20 MG-MCG CHEW Take 1 tablet by mouth daily chew and swallow    HUMIRA, 2 PEN, 40 MG/0.4ML PNKT     hydrOXYzine pamoate (VISTARIL) 25 MG capsule Take 1 capsule by mouth 3 times daily as needed for Anxiety    adalimumab (HUMIRA) 40 MG/0.8ML injection Inject into the skin once    PEG 3350-KCl-NaBcb-NaCl-NaSulf (PEG-3350/ELECTROLYTES) 236 g SOLR TAKE 2 LITERS TWICE DAY AS DIRECTED (Patient not taking: Reported on 01/14/2023)    acetaminophen (TYLENOL) 500 MG tablet Take 2 tablets by mouth every 6-8 hours as needed for Pain     No current facility-administered medications for this visit.       Physical Examination: General appearance - alert, well appearing, and in no distress  Mental status - alert, oriented to person, place, and time    MSK-tenderness to palpation  over proximal bicep tendon  Assessment/ Plan:   Zujey was seen today for medication check.    Diagnoses and all orders for this visit:    Moderate episode of recurrent major depressive disorder (HCC)  -     venlafaxine (EFFEXOR XR) 37.5 MG extended release capsule; Take 1 capsule by mouth daily  She has unfortunately not noticed much benefit with the Zoloft and Vraylar.  Discussed that we would add on an SNRI and maintain the Zoloft and Vraylar for now.  If she is doing better at follow-up with the Effexor then we will plan to increase the Effexor further and decrease the dose of the Zoloft to eventually discontinue.  If she continues to do well we could further consider discontinuation/decreasing the Vraylar in a stepwise manner.  Iron deficiency anemia due to chronic blood loss  -     CBC; Future  -     Ferritin; Future  -     Iron and TIBC; Future    Biceps tendinitis of right shoulder  Exercises given     Return in about 6 weeks (  around 04/08/2023), or if symptoms worsen or fail to improve.        I have discussed the diagnosis with the patient and the intended plan as seen in the above orders.  The patient has received an after-visit summary and questions were answered concerning future plans. Pt conveyed understanding of plan.    Medication Side Effects and Warnings were discussed with patient    An electronic signature was used to authenticate this note  Lolita Patella, DO

## 2023-02-25 NOTE — Patient Instructions (Signed)
Patient Education        A Healthy Lifestyle: Care Instructions  A healthy lifestyle can help you feel good, have more energy, and stay at a weight that's healthy for you. You can share a healthy lifestyle with your friends and family. And you can do it on your own.    Eat meals with your friends or family. You could try cooking together.    Plan activities with other people. Go for a walk with a friend, try a free online fitness class, or join a sports league.    Eat a variety of healthy foods. These include fruits, vegetables, whole grains, low-fat dairy, and lean protein.    Choose healthy portions of food. You can use the Nutrition Facts label on food packages as a guide.    Eat more fruits and vegetables. You could add vegetables to sandwiches or add fruit to cereal.    Drink water when you are thirsty. Limit soda, juice, and sports drinks.    Try to exercise most days. Aim for at least 2 hours of exercise each week.    Keep moving. Work in the garden or take your dog on a walk. Use the stairs instead of the elevator.    If you use tobacco or nicotine, try to quit. Ask your doctor about programs and medicines to help you quit.    Limit alcohol. Men should have no more than 2 drinks a day. Women should have no more than 1. For some people, no alcohol is the best choice.  Follow-up care is a key part of your treatment and safety. Be sure to make and go to all appointments, and call your doctor if you are having problems. It's also a good idea to know your test results and keep a list of the medicines you take.  Where can you learn more?  Go to https://www.healthwise.net/patientEd and enter U807 to learn more about "A Healthy Lifestyle: Care Instructions."  Current as of: June 27, 2022               Content Version: 14.0   2006-2024 Healthwise, Incorporated.   Care instructions adapted under license by [facility]. If you have questions about a medical condition or this instruction, always ask your healthcare  professional. Healthwise, Incorporated disclaims any warranty or liability for your use of this information.

## 2023-02-26 LAB — CBC
Hematocrit: 34.4 % (ref 34.0–46.6)
Hemoglobin: 10.8 g/dL — ABNORMAL LOW (ref 11.1–15.9)
MCH: 25.4 pg — ABNORMAL LOW (ref 26.6–33.0)
MCHC: 31.4 g/dL — ABNORMAL LOW (ref 31.5–35.7)
MCV: 81 fL (ref 79–97)
Platelets: 437 10*3/uL (ref 150–450)
RBC: 4.25 x10E6/uL (ref 3.77–5.28)
RDW: 15.4 % (ref 11.7–15.4)
WBC: 9.6 10*3/uL (ref 3.4–10.8)

## 2023-02-26 LAB — IRON AND TIBC
Iron % Saturation: 13 % — ABNORMAL LOW (ref 15–55)
Iron: 52 ug/dL (ref 27–159)
TIBC: 402 ug/dL (ref 250–450)
UIBC: 350 ug/dL (ref 131–425)

## 2023-02-26 LAB — FERRITIN: Ferritin: 10 ng/mL — ABNORMAL LOW (ref 15–150)

## 2023-03-01 MED ORDER — SLOW FE 142 (45 FE) MG PO TBCR
142 | ORAL_TABLET | Freq: Every day | ORAL | 1 refills | Status: AC
Start: 2023-03-01 — End: ?

## 2023-03-03 NOTE — Progress Notes (Signed)
The [employer] short term disability //attending physicians form was faxed on 02/11/23 and placed scan.

## 2023-04-08 ENCOUNTER — Ambulatory Visit
Admit: 2023-04-08 | Discharge: 2023-04-08 | Payer: PRIVATE HEALTH INSURANCE | Attending: Family Medicine | Primary: Family Medicine

## 2023-04-08 DIAGNOSIS — D500 Iron deficiency anemia secondary to blood loss (chronic): Secondary | ICD-10-CM

## 2023-04-08 MED ORDER — VENLAFAXINE HCL ER 75 MG PO CP24
75 MG | ORAL_CAPSULE | Freq: Every day | ORAL | 1 refills | Status: AC
Start: 2023-04-08 — End: 2023-05-04

## 2023-04-08 NOTE — Patient Instructions (Signed)
Patient Education        A Healthy Lifestyle: Care Instructions  A healthy lifestyle can help you feel good, have more energy, and stay at a weight that's healthy for you. You can share a healthy lifestyle with your friends and family. And you can do it on your own.    Eat meals with your friends or family. You could try cooking together.    Plan activities with other people. Go for a walk with a friend, try a free online fitness class, or join a sports league.    Eat a variety of healthy foods. These include fruits, vegetables, whole grains, low-fat dairy, and lean protein.    Choose healthy portions of food. You can use the Nutrition Facts label on food packages as a guide.    Eat more fruits and vegetables. You could add vegetables to sandwiches or add fruit to cereal.    Drink water when you are thirsty. Limit soda, juice, and sports drinks.    Try to exercise most days. Aim for at least 2 hours of exercise each week.    Keep moving. Work in the garden or take your dog on a walk. Use the stairs instead of the elevator.    If you use tobacco or nicotine, try to quit. Ask your doctor about programs and medicines to help you quit.    Limit alcohol. Men should have no more than 2 drinks a day. Women should have no more than 1. For some people, no alcohol is the best choice.  Follow-up care is a key part of your treatment and safety. Be sure to make and go to all appointments, and call your doctor if you are having problems. It's also a good idea to know your test results and keep a list of the medicines you take.  Where can you learn more?  Go to https://www.healthwise.net/patientEd and enter U807 to learn more about "A Healthy Lifestyle: Care Instructions."  Current as of: June 27, 2022               Content Version: 14.0   2006-2024 Healthwise, Incorporated.   Care instructions adapted under license by [facility]. If you have questions about a medical condition or this instruction, always ask your healthcare  professional. Healthwise, Incorporated disclaims any warranty or liability for your use of this information.

## 2023-04-08 NOTE — Progress Notes (Signed)
Chief Complaint   Patient presents with    Medication Check     Patient presents in office today for med check.  Has c/o pain in her right breast off and on.  No other concerns.      "Have you been to the ER, urgent care clinic since your last visit?  Hospitalized since your last visit?"    NO    "Have you seen or consulted any other health care providers outside of [facility] since your last visit?"    NO            Click Here for Release of Records Request

## 2023-04-08 NOTE — Progress Notes (Signed)
Progress Note    she is a 37 y.o. year old female who presents for evaluation.    Subjective:     Pt states she feels less depressed.  We had initiated Effexor 37.5 daily.  Still low energy levels and she is taking iron due to anemia.  She stopped the zoloft completely 2 weeks after she started the effexor instead of cutting back, there was a misunderstanding there.  We will just leave her off the zoloft since no benefit.      2 periods last week had one for 6 weeks then was away for a week then came back heavy for 4 days.  She does also have crohn's which may be contributing to her fatigue as well.  She is on OCP now for family planning.        Grandmother passed away recently.    Reviewed PmHx, RxHx, FmHx, SocHx, AllgHx and updated and dated in the chart.    Review of Systems - negative except as listed above in the HPI    Objective:     Vitals:    04/08/23 0904   BP: 110/67   Site: Right Upper Arm   Position: Sitting   Pulse: 91   Resp: 18   Temp: 98.4 F (36.9 C)   TempSrc: Oral   SpO2: 96%   Weight: 104.8 kg (231 lb)   Height: 1.778 m (5\' 10" )       Current Outpatient Medications   Medication Sig    venlafaxine (EFFEXOR XR) 75 MG extended release capsule Take 1 capsule by mouth daily    ferrous sulfate (SLOW FE) 142 (45 Fe) MG extended release tablet Take 142 mg by mouth daily    TYBLUME 0.1-20 MG-MCG CHEW Take 1 tablet by mouth daily chew and swallow    HUMIRA, 2 PEN, 40 MG/0.4ML PNKT     hydrOXYzine pamoate (VISTARIL) 25 MG capsule Take 1 capsule by mouth 3 times daily as needed for Anxiety    adalimumab (HUMIRA) 40 MG/0.8ML injection Inject into the skin once    acetaminophen (TYLENOL) 500 MG tablet Take 2 tablets by mouth every 6-8 hours as needed for Pain     No current facility-administered medications for this visit.       Physical Examination: General appearance - alert, well appearing, and in no distress  Chest - clear to auscultation, no wheezes, rales or rhonchi, symmetric air entry  Heart -  normal rate, regular rhythm, normal S1, S2, no murmurs, rubs, clicks or gallops      Assessment/ Plan:   Symba was seen today for medication check.    Diagnoses and all orders for this visit:    Iron deficiency anemia due to chronic blood loss  -     Iron and TIBC; Future  -     Ferritin; Future  -     CBC; Future  Discussed during heavy.  She can take the iron twice daily vitamin C will help absorption.  If upsetting her stomach back off to 1 a day.  Make sure to take with food as well.  Moderate episode of recurrent major depressive disorder (HCC)  -     venlafaxine (EFFEXOR XR) 75 MG extended release capsule; Take 1 capsule by mouth daily  Fatigue suspect multifactorial Crohn's disease, anemia, and depression     Return in about 2 months (around 06/08/2023), or if symptoms worsen or fail to improve.        I have discussed  the diagnosis with the patient and the intended plan as seen in the above orders.  The patient has received an after-visit summary and questions were answered concerning future plans. Pt conveyed understanding of plan.    Medication Side Effects and Warnings were discussed with patient    An electronic signature was used to authenticate this note  Lolita Patella, DO

## 2023-04-09 LAB — CBC
Hematocrit: 35 % (ref 34.0–46.6)
Hemoglobin: 11.3 g/dL (ref 11.1–15.9)
MCH: 25.7 pg — ABNORMAL LOW (ref 26.6–33.0)
MCHC: 32.3 g/dL (ref 31.5–35.7)
MCV: 80 fL (ref 79–97)
Platelets: 384 10*3/uL (ref 150–450)
RBC: 4.4 x10E6/uL (ref 3.77–5.28)
RDW: 16.7 % — ABNORMAL HIGH (ref 11.7–15.4)
WBC: 11.2 10*3/uL — ABNORMAL HIGH (ref 3.4–10.8)

## 2023-04-09 LAB — IRON AND TIBC
Iron % Saturation: 19 % (ref 15–55)
Iron: 81 ug/dL (ref 27–159)
TIBC: 430 ug/dL (ref 250–450)
UIBC: 349 ug/dL (ref 131–425)

## 2023-04-09 LAB — FERRITIN: Ferritin: 17 ng/mL (ref 15–150)

## 2023-04-22 NOTE — Telephone Encounter (Signed)
Patient dropped off paperwork on 04/22/2023 to be filled out and faxed when done. She would like a copy put up front for her to pick up and a call at [phone] when ready. Put in provider bin

## 2023-04-22 NOTE — Telephone Encounter (Signed)
Called and spoke with patient. She states that Dr. Estevan Oaks completed this last year for her son. I found it scanned in media and printed it for Dr. Estevan Oaks. See further documentation in her sons chart

## 2023-04-22 NOTE — Telephone Encounter (Signed)
This should really be documented underneath the person that she is trying to get FMLA for.  However, the patient she is trying to get FMLA for needs an appointment for having this paperwork filled out.  It is for taking care of Denise Huber who is also a patient of this practice but not seen in almost a year.

## 2023-04-22 NOTE — Telephone Encounter (Signed)
Placed on provider desk for completion.  

## 2023-04-26 ENCOUNTER — Telehealth: Admit: 2023-04-26 | Discharge: 2023-04-26 | Payer: PRIVATE HEALTH INSURANCE | Attending: Family Medicine

## 2023-04-26 DIAGNOSIS — G5603 Carpal tunnel syndrome, bilateral upper limbs: Secondary | ICD-10-CM

## 2023-04-26 NOTE — Progress Notes (Signed)
Progress Note    she is a 37 y.o. year old female who presents for evaluation.    Subjective:     Tingling and numbness from wrist to finger tips. States it is all fingers both hands and feels worse over past two weeks.  Tries to shake it out.,        Reviewed PmHx, RxHx, FmHx, SocHx, AllgHx and updated and dated in the chart.    Review of Systems - negative except as listed above in the HPI    Objective:   There were no vitals filed for this visit.    Current Outpatient Medications   Medication Sig    venlafaxine (EFFEXOR XR) 75 MG extended release capsule Take 1 capsule by mouth daily    ferrous sulfate (SLOW FE) 142 (45 Fe) MG extended release tablet Take 142 mg by mouth daily    TYBLUME 0.1-20 MG-MCG CHEW Take 1 tablet by mouth daily chew and swallow    HUMIRA, 2 PEN, 40 MG/0.4ML PNKT     hydrOXYzine pamoate (VISTARIL) 25 MG capsule Take 1 capsule by mouth 3 times daily as needed for Anxiety    adalimumab (HUMIRA) 40 MG/0.8ML injection Inject into the skin once    acetaminophen (TYLENOL) 500 MG tablet Take 2 tablets by mouth every 6-8 hours as needed for Pain     No current facility-administered medications for this visit.       Physical Examination: General appearance - alert, well appearing, and in no distress  Mental status - alert, oriented to person, place, and time      Assessment/ Plan:   Soli was seen today for numbness.    Diagnoses and all orders for this visit:    Bilateral carpal tunnel syndrome  -     AFL - Benita Gutter, MD, Orthopedic Surgery (elbow, hand, wrist), [place]    Discussed this patient difficult to evaluate without actually being in person able to do a physical exam.  I like her to follow-up with a hand specialist for further evaluation.  Return if symptoms worsen or fail to improve.      I have discussed the diagnosis with the patient and the intended plan as seen in the above orders.  The patient has received an after-visit summary and questions were answered concerning  future plans. Pt conveyed understanding of plan.    Medication Side Effects and Warnings were discussed with patient        SEGEN TITUS, was evaluated through a synchronous (real-time) audio-video encounter. The patient (or guardian if applicable) is aware that this is a billable service, which includes applicable co-pays. This Virtual Visit was conducted with patient's (and/or legal guardian's) consent. Patient identification was verified, and a caregiver was present when appropriate.   The patient was located at Home: [address] [place] [address]  Provider was located at Home (Appt Dept State): [place]  Confirm you are appropriately licensed, registered, or certified to deliver care in the state where the patient is located as indicated above. If you are not or unsure, please re-schedule the visit: Yes, I confirm.

## 2023-04-26 NOTE — Patient Instructions (Signed)
Patient Education        A Healthy Lifestyle: Care Instructions  A healthy lifestyle can help you feel good, have more energy, and stay at a weight that's healthy for you. You can share a healthy lifestyle with your friends and family. And you can do it on your own.    Eat meals with your friends or family. You could try cooking together.    Plan activities with other people. Go for a walk with a friend, try a free online fitness class, or join a sports league.    Eat a variety of healthy foods. These include fruits, vegetables, whole grains, low-fat dairy, and lean protein.    Choose healthy portions of food. You can use the Nutrition Facts label on food packages as a guide.    Eat more fruits and vegetables. You could add vegetables to sandwiches or add fruit to cereal.    Drink water when you are thirsty. Limit soda, juice, and sports drinks.    Try to exercise most days. Aim for at least 2 hours of exercise each week.    Keep moving. Work in the garden or take your dog on a walk. Use the stairs instead of the elevator.    If you use tobacco or nicotine, try to quit. Ask your doctor about programs and medicines to help you quit.    Limit alcohol. Men should have no more than 2 drinks a day. Women should have no more than 1. For some people, no alcohol is the best choice.  Follow-up care is a key part of your treatment and safety. Be sure to make and go to all appointments, and call your doctor if you are having problems. It's also a good idea to know your test results and keep a list of the medicines you take.  Where can you learn more?  Go to https://www.healthwise.net/patientEd and enter U807 to learn more about "A Healthy Lifestyle: Care Instructions."  Current as of: June 27, 2022               Content Version: 14.0   2006-2024 [employer].   Care instructions adapted under license by [facility]. If you have questions about a medical condition or this instruction, always ask your healthcare  professional. [employer] disclaims any warranty or liability for your use of this information.

## 2023-04-26 NOTE — Progress Notes (Signed)
Per patient, she is [place] in the [place] virtual

## 2023-05-02 ENCOUNTER — Encounter

## 2023-05-04 MED ORDER — VENLAFAXINE HCL ER 75 MG PO CP24
75 MG | ORAL_CAPSULE | Freq: Every day | ORAL | 0 refills | Status: DC
Start: 2023-05-04 — End: 2024-03-02

## 2023-06-15 ENCOUNTER — Encounter

## 2023-07-11 ENCOUNTER — Ambulatory Visit: Payer: PRIVATE HEALTH INSURANCE | Attending: Family Medicine | Primary: Family Medicine

## 2023-10-23 ENCOUNTER — Inpatient Hospital Stay
Admit: 2023-10-23 | Discharge: 2023-10-23 | Disposition: A | Discharge status: Home / Self Care | Payer: PRIVATE HEALTH INSURANCE | Attending: Emergency Medicine | Admitting: Emergency Medicine

## 2023-10-23 DIAGNOSIS — T7840XA Allergy, unspecified, initial encounter: Secondary | ICD-10-CM

## 2023-10-23 DIAGNOSIS — O9A212 Injury, poisoning and certain other consequences of external causes complicating pregnancy, second trimester: Secondary | ICD-10-CM

## 2023-10-23 MED ORDER — PREDNISONE 20 MG PO TABS
20 | ORAL | Status: AC
Start: 2023-10-23 — End: 2023-10-23
  Administered 2023-10-23: 07:00:00 60 mg via ORAL

## 2023-10-23 MED ORDER — IPRATROPIUM-ALBUTEROL 0.5-2.5 (3) MG/3ML IN SOLN
Freq: Once | RESPIRATORY_TRACT | Status: AC
Start: 2023-10-23 — End: 2023-10-23
  Administered 2023-10-23: 07:00:00 1 via RESPIRATORY_TRACT

## 2023-10-23 MED ORDER — DIPHENHYDRAMINE HCL 25 MG PO CAPS
25 | ORAL | Status: AC
Start: 2023-10-23 — End: 2023-10-23
  Administered 2023-10-23: 07:00:00 50 mg via ORAL

## 2023-10-23 MED ORDER — PREDNISONE 50 MG PO TABS
50 | ORAL_TABLET | Freq: Every day | ORAL | 0 refills | Status: AC
Start: 2023-10-23 — End: 2023-10-28

## 2023-10-23 MED ORDER — DIPHENHYDRAMINE HCL 25 MG PO CAPS
25 | ORAL_CAPSULE | Freq: Four times a day (QID) | ORAL | 0 refills | Status: AC | PRN
Start: 2023-10-23 — End: 2023-11-02

## 2023-10-23 MED ORDER — FAMOTIDINE 20 MG PO TABS
20 | ORAL | Status: AC
Start: 2023-10-23 — End: 2023-10-23
  Administered 2023-10-23: 07:00:00 20 mg via ORAL

## 2023-10-23 MED FILL — ALBUTEROL SULFATE (2.5 MG/3ML) 0.083% IN NEBU: RESPIRATORY_TRACT | Qty: 3

## 2023-10-23 MED FILL — DIPHENHYDRAMINE HCL 25 MG PO CAPS: 25 MG | ORAL | Qty: 2

## 2023-10-23 MED FILL — FAMOTIDINE 20 MG PO TABS: 20 MG | ORAL | Qty: 1

## 2023-10-23 MED FILL — PREDNISONE 20 MG PO TABS: 20 MG | ORAL | Qty: 3

## 2023-10-23 NOTE — ED Triage Notes (Signed)
 Pt.presents for evaluation of throat feeling itching and feels like it is closing. Pt.states she woke at 0100 with the sxs.     States throat is sore at tonsil area.  Last ate strawberry icecream at 2230.  Denies any other changes in food/environment    Pt

## 2023-10-23 NOTE — ED Provider Notes (Signed)
 [facility] EMERGENCY DEPT  EMERGENCY DEPARTMENT ENCOUNTER      Pt Name: Denise Huber  MRN: [id number]  Birthdate 09-10-1986  Date of evaluation: 10/23/2023  Provider: Loni Beckwith, MD    CHIEF COMPLAINT       Chief Complaint   Patient presents with    [facility]

## 2023-10-23 NOTE — ED Notes (Signed)
 Discharge instructions reviewed, discharge papers given and pt teaching completed. (2) prescription(s) discussed. Pt instructed to follow up with PCP regarding today's visit. Pt also instructed to report to ED if symptoms return, worsen, don't subside, and

## 2023-11-25 ENCOUNTER — Encounter: Admit: 2023-11-25

## 2023-11-25 ENCOUNTER — Ambulatory Visit: Admit: 2023-11-25 | Payer: PRIVATE HEALTH INSURANCE

## 2023-11-25 VITALS — BP 110/67 | HR 98 | Resp 17 | Ht 70.0 in | Wt 251.0 lb

## 2023-11-25 DIAGNOSIS — Z111 Encounter for screening for respiratory tuberculosis: Secondary | ICD-10-CM

## 2023-11-25 NOTE — Progress Notes (Signed)
 Denise Huber is a 38 y.o. female , id x 2(name and DOB). Reviewed record, history, and  medications.    Chief Complaint   Patient presents with    PPD Reading     Needed for work        Health Maintenance Due   Topic    Pneumococcal 0-64 years Vaccine (1 of 2 - PCV)    Varicella vaccine (1 of 2 - 13+ 2-dose series)    Hepatitis B vaccine (1 of 3 - 19+ 3-dose series)    DTaP/Tdap/Td vaccine (1 - Tdap)    HPV vaccine (2 - 3-dose series)    Diabetes screen     Flu vaccine (1)    COVID-19 Vaccine (3 - 2023-24 season)       Wt Readings from Last 1 Encounters:   11/25/23 113.9 kg (251 lb)     BP Readings from Last 3 Encounters:   11/25/23 110/67   10/23/23 122/82   04/08/23 110/67     Pulse Readings from Last 1 Encounters:   11/25/23 98         Patient is currently accompanied by self & I have received verbal consent from [name] to discuss any/all medical information while he/she is present in the room.    Home BP cuff present today:  no    Home medication list or bottles present today: no  - All medications were reviewed and updated with the patient today in office.    Forms for completion: no    Chest pain/ Shortness of breath: no    Surgery within the next month:  no      Depression Screening:  :     Depression: At risk (02/25/2023)    PHQ-2     PHQ-2 Score: 9          Coordination of Care Questionnaire:  :     Have you been to the ER, urgent care clinic since your last visit?  Hospitalized since your last visit?    YES - When: approximately 1 months ago.  Where and Why: Allergic reaction.    "Have you seen or consulted any other health care providers outside of [facility] since your last visit?"    NO    Click Here for Release of Records Request      --Alvera Lesches, CMA       ------------------------------------------------

## 2023-11-25 NOTE — Progress Notes (Signed)
 Denise Huber (DOB:  August 26, 1986) is a 38 y.o. female,Established patient, here for evaluation of the following chief complaint(s):  PPD Reading (Needed for work)         Assessment & Plan  Screening for tuberculosis   No history of exposure or current symptoms. Will get lab draw as below.    Orders:    QuantiFERON In Tube; Future      Return in about 6 months (around 05/24/2024) for yearly physical with fasting labs.       Subjective    TB Screening      Needed for work - no history of exposure or current symptoms.         Review of Systems   All other systems reviewed and are negative.         Objective   Physical Exam  Constitutional:       General: She is not in acute distress.     Appearance: Normal appearance. She is not ill-appearing.   HENT:      Head: Normocephalic and atraumatic.      Right Ear: External ear normal.      Left Ear: External ear normal.      Nose: Nose normal.   Eyes:      General: No scleral icterus.        Right eye: No discharge.         Left eye: No discharge.      Extraocular Movements: Extraocular movements intact.      Conjunctiva/sclera: Conjunctivae normal.   Cardiovascular:      Rate and Rhythm: Normal rate and regular rhythm.      Pulses: Normal pulses.      Heart sounds: Normal heart sounds. No murmur heard.     No friction rub. No gallop.   Pulmonary:      Effort: Pulmonary effort is normal. No respiratory distress.      Breath sounds: Normal breath sounds. No stridor. No wheezing, rhonchi or rales.   Chest:      Chest wall: No tenderness.   Neurological:      General: No focal deficit present.      Mental Status: She is alert and oriented to person, place, and time.   Psychiatric:         Mood and Affect: Mood normal.         Behavior: Behavior normal.         Thought Content: Thought content normal.         Judgment: Judgment normal.            An electronic signature was used to authenticate this note.    --Oluwasemilore Bahl, DO

## 2023-11-30 ENCOUNTER — Encounter

## 2023-11-30 LAB — QUANTIFERON TB GOLD PLUS
QuantiFERON Mitogen Value: 0.43 [IU]/mL
QuantiFERON Nil Value: 0.03 [IU]/mL
QuantiFERON TB1 Ag Value: 0.02 [IU]/mL
QuantiFERON TB2 Ag Value: 0.03 [IU]/mL

## 2023-11-30 LAB — QUANTIFERON IN TUBE: Quantiferon TB Gold Plus: UNDETERMINED — AB

## 2023-11-30 NOTE — Progress Notes (Signed)
Quantiferon gold re-ordered

## 2023-12-03 LAB — QUANTIFERON TB GOLD PLUS
QuantiFERON Mitogen Value: 0.08 [IU]/mL
QuantiFERON Nil Value: 0 [IU]/mL
QuantiFERON TB1 Ag Value: 0.01 [IU]/mL
QuantiFERON TB2 Ag Value: 0 [IU]/mL

## 2023-12-03 LAB — QUANTIFERON IN TUBE: Quantiferon TB Gold Plus: UNDETERMINED — AB

## 2024-01-10 NOTE — Telephone Encounter (Signed)
Called and spoke to pt they will pick up results in office.

## 2024-03-02 ENCOUNTER — Ambulatory Visit: Admit: 2024-03-02 | Discharge: 2024-03-02 | Payer: PRIVATE HEALTH INSURANCE

## 2024-03-02 VITALS — BP 100/69 | HR 86 | Temp 97.80000°F | Resp 18 | Ht 70.0 in | Wt 221.0 lb

## 2024-03-02 DIAGNOSIS — F411 Generalized anxiety disorder: Secondary | ICD-10-CM

## 2024-03-02 MED ORDER — SUMATRIPTAN SUCCINATE 50 MG PO TABS
50 | ORAL_TABLET | Freq: Once | ORAL | 5 refills | Status: AC | PRN
Start: 2024-03-02 — End: ?

## 2024-03-02 MED ORDER — ESCITALOPRAM OXALATE 20 MG PO TABS
20 | ORAL_TABLET | Freq: Every day | ORAL | 3 refills | Status: AC
Start: 2024-03-02 — End: ?

## 2024-03-02 NOTE — Assessment & Plan Note (Addendum)
 Worsening since giving birth last month.  Will trial Imitrex as needed and see how she does.      Orders:    SUMAtriptan (IMITREX) 50 MG tablet; Take 1 tablet by mouth once as needed for Migraine

## 2024-03-02 NOTE — Progress Notes (Signed)
 Denise Huber (DOB:  05-21-1986) is a 38 y.o. female,Established patient, here for evaluation of the following chief complaint(s):  Anxiety         Assessment & Plan  GAD (generalized anxiety disorder)  She had previously done well on Atarax as needed, however, symptoms exacerbated since giving birth last month.  Her OB/GYN started her on Lexapro, she is ramped up to 20 mg daily.  She has tolerated this dose well although has not started to notice an effect yet.  I educated her on needing at least 6 weeks on the therapeutic effective dose before being able to tell whether it works.  I recommend she continue course for now, refills provided today.      Orders:    escitalopram (LEXAPRO) 20 MG tablet; Take 1 tablet by mouth daily    Migraine without aura and without status migrainosus, not intractable  Worsening since giving birth last month.  Will trial Imitrex as needed and see how she does.      Orders:    SUMAtriptan (IMITREX) 50 MG tablet; Take 1 tablet by mouth once as needed for Migraine      Return in about 6 weeks (around 04/13/2024) for yearly physical with labs.       Subjective   Patient here to follow-up on anxiety.  Her OB/GYN started her on Lexapro, she has been on the 20 mg dose for 1 week.  No effect as of yet.  No side effects.  She also describes headaches, throbbing in nature which affect 1 side of her head and can last hours.  Loud noises and bright lights make it worse.  She takes Tylenol and finds a quiet place to rest, which sometimes helps.        Review of Systems   All other systems reviewed and are negative.         Objective   Physical Exam  Constitutional:       General: She is not in acute distress.     Appearance: Normal appearance. She is not ill-appearing.   HENT:      Head: Normocephalic and atraumatic.      Right Ear: External ear normal.      Left Ear: External ear normal.      Nose: Nose normal.   Eyes:      General: No scleral icterus.        Right eye: No discharge.          Left eye: No discharge.      Extraocular Movements: Extraocular movements intact.      Conjunctiva/sclera: Conjunctivae normal.   Cardiovascular:      Rate and Rhythm: Normal rate and regular rhythm.      Pulses: Normal pulses.      Heart sounds: Normal heart sounds. No murmur heard.     No friction rub. No gallop.   Pulmonary:      Effort: Pulmonary effort is normal. No respiratory distress.      Breath sounds: Normal breath sounds. No stridor. No wheezing, rhonchi or rales.   Chest:      Chest wall: No tenderness.   Neurological:      General: No focal deficit present.      Mental Status: She is alert and oriented to person, place, and time.   Psychiatric:         Mood and Affect: Mood normal.         Behavior: Behavior normal.  Thought Content: Thought content normal.         Judgment: Judgment normal.            An electronic signature was used to authenticate this note.    --Brynlie Daza, DO

## 2024-03-02 NOTE — Progress Notes (Signed)
 Patient here for follow up for anxiety. She had a baby on 3/21/20265. Dr. Jammie Mccune, OB/GYN started patient on escitalopram. She states she has been on it for 1 week.     "Have you been to the ER, urgent care clinic since your last visit?  Hospitalized since your last visit?"    NO    "Have you seen or consulted any other health care providers outside our system since your last visit?"    NO

## 2024-03-02 NOTE — Assessment & Plan Note (Addendum)
 She had previously done well on Atarax as needed, however, symptoms exacerbated since giving birth last month.  Her OB/GYN started her on Lexapro, she is ramped up to 20 mg daily.  She has tolerated this dose well although has not started to notice an effect yet.  I educated her on needing at least 6 weeks on the therapeutic effective dose before being able to tell whether it works.  I recommend she continue course for now, refills provided today.      Orders:    escitalopram (LEXAPRO) 20 MG tablet; Take 1 tablet by mouth daily
